# Patient Record
Sex: Female | Born: 1955 | Race: Black or African American | Smoking: Never smoker
Health system: Northeastern US, Academic
[De-identification: ages and names within clinical notes are randomized; demographics above are authoritative.]

## PROBLEM LIST (undated history)

## (undated) DIAGNOSIS — I10 Essential (primary) hypertension: Secondary | ICD-10-CM

## (undated) DIAGNOSIS — E119 Type 2 diabetes mellitus without complications: Secondary | ICD-10-CM

## (undated) DIAGNOSIS — F419 Anxiety disorder, unspecified: Secondary | ICD-10-CM

## (undated) DIAGNOSIS — M069 Rheumatoid arthritis, unspecified: Secondary | ICD-10-CM

## (undated) HISTORY — DX: Rheumatoid arthritis, unspecified: M06.9

## (undated) HISTORY — DX: Essential (primary) hypertension: I10

## (undated) HISTORY — PX: CHOLECYSTECTOMY, LAPAROSCOPIC: SHX56

## (undated) HISTORY — PX: KNEE SURGERY: SHX244

## (undated) HISTORY — PX: CHOLECYSTECTOMY: SHX55

## (undated) HISTORY — PX: JOINT REPLACEMENT: SHX530

## (undated) HISTORY — PX: KNEE REPLACEMENT: SHX530B

## (undated) HISTORY — DX: Type 2 diabetes mellitus without complications: E11.9

## (undated) HISTORY — PX: LUMBAR FUSION: SHX111

## (undated) HISTORY — DX: Anxiety disorder, unspecified: F41.9

## (undated) HISTORY — PX: BACK SURGERY: SHX140

## (undated) SURGERY — MAMMOPLASTY, REDUCTION
Anesthesia: General | Site: Breast | Laterality: Bilateral

---

## 2006-08-31 HISTORY — PX: BREAST BIOPSY: SHX20

## 2015-07-14 ENCOUNTER — Ambulatory Visit
Admission: AD | Admit: 2015-07-14 | Discharge: 2015-07-14 | Disposition: A | Discharge status: Home / Self Care | Payer: Self-pay | Attending: Emergency Medicine | Admitting: Emergency Medicine

## 2015-07-14 DIAGNOSIS — H9192 Unspecified hearing loss, left ear: Secondary | ICD-10-CM

## 2015-07-14 HISTORY — DX: Essential (primary) hypertension: I10

## 2015-07-14 HISTORY — DX: Type 2 diabetes mellitus without complications: E11.9

## 2015-07-14 NOTE — ED Triage Notes (Signed)
pt with c/o left ear fullness ringing plugged feeling since yesterday     Triage Note   Gavin PoundElizabeth M Kennedy-Gebo, RN

## 2015-07-14 NOTE — UC Provider Note (Signed)
History     Chief Complaint   Patient presents with    Ear Fullness     pt with c/o left ear fullness ringing plugged feeling since yesterday      HPI Comments: Had sudden onset absolute loss of hearing in L ear since late yesterday. No preceding illness, no allergies, no recent flight. Sometimes uses olive oil to remove wax, but it did not help. No family history of hearing loss.      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 No pertinent family history.      Social History    reports that she has never smoked. She does not have any smokeless tobacco history on file. She reports that she does not drink alcohol or use illicit drugs. Her sexual activity history is not on file.    Living Situation     Questions Responses    Patient lives with Family    Homeless No    Caregiver for other family member No    External Services None    Employment Retired    Domestic Violence Risk No          Review of Systems   Review of Systems   Constitutional: Negative for activity change, chills and fever.   HENT: Positive for hearing loss. Negative for congestion, dental problem, ear discharge, ear pain, facial swelling, rhinorrhea, sinus pressure, sore throat, tinnitus, trouble swallowing and voice change.    Eyes: Negative for visual disturbance.   Respiratory: Negative for cough and shortness of breath.    Cardiovascular: Negative for chest pain.   Gastrointestinal: Negative for nausea and vomiting.   Musculoskeletal: Negative for gait problem.   Skin: Negative for rash.   Neurological: Negative for dizziness, syncope, facial asymmetry, speech difficulty, weakness, numbness (or paresthesia) and headaches.   Hematological:  Negative for adenopathy.   Psychiatric/Behavioral: Negative for sleep disturbance.       Physical Exam       ED Triage Vitals   BP Heart Rate Heart Rate (via Pulse Ox) Resp Temp Temp src SpO2 O2 Device O2 Flow Rate   07/14/15 1530 07/14/15 1530 -- 07/14/15 1530 07/14/15 1530 -- 07/14/15 1530 -- --   156/82 65  18 36.6 C (97.9 F)  98 %        Weight           07/14/15 1530           95.3 kg (210 lb)               Physical Exam   Constitutional: She is oriented to person, place, and time. Vital signs are normal. She appears well-developed. No distress.   HENT:   Head: Normocephalic and atraumatic.   Nose: Nose normal.   Mouth/Throat: Oropharynx is clear and moist.   R canal and TM clear.  L canal is clear, L TM is clear, L middle ear space appears clear.  She does not hear finger rub or tuning fork on the L.  When tuning fork is placed at top of head (512C) she does not hear it on the left.   Eyes: Conjunctivae are normal. No scleral icterus.   Neck: Neck supple.   Cardiovascular: Normal rate, regular rhythm, normal heart sounds and intact distal pulses.    Pulmonary/Chest: Effort normal and breath sounds normal.   Musculoskeletal:   Gait normal   Neurological: She is alert and oriented to person, place, and time. No cranial nerve deficit (Face symmetric, speech clear.). Gait normal.   Skin: Skin is warm and dry.   Psychiatric: She has a normal mood and affect. Her behavior is normal.   Nursing note and vitals reviewed.       Medical Decision Making        Initial Evaluation:  ED First Provider Contact     Date/Time Event User Comments    07/14/15 1613 ED Provider First Contact Dorna Bloom Initial Face to Face Provider Contact          Patient seen by me as above    Assessment:  59 y.o.female [facility] with sudden profound left-sided hearing loss    Differential Diagnosis includes acoustic neuroma, meniere's, unrecognized trauma, other brain lesion, structural ear problem               Plan: Referred to ENT, and f/u w PCP. Instructed not to put anything into the ear. Warning signs reviewed.    Final Diagnosis  Final diagnoses:   [H91.92] Acute hearing loss of left ear (Primary)           Dorna Bloom, MD       Dorna Bloom, MD  07/15/15 1719

## 2015-07-14 NOTE — Discharge Instructions (Signed)
DO NOT PUT ANYTHING INTO THE EAR.  DO NOT CLEAN THE EAR WITH ANYTHING OTHER THAN A WASHCLOTH.

## 2015-07-16 ENCOUNTER — Ambulatory Visit: Payer: Self-pay | Admitting: Audiology

## 2015-07-16 ENCOUNTER — Ambulatory Visit: Payer: Self-pay | Admitting: Otolaryngology

## 2015-07-16 ENCOUNTER — Encounter: Payer: Self-pay | Admitting: Otolaryngology

## 2015-07-16 VITALS — BP 161/81 | HR 63 | Temp 97.9°F | Ht 64.0 in | Wt 209.0 lb

## 2015-07-16 DIAGNOSIS — H9122 Sudden idiopathic hearing loss, left ear: Secondary | ICD-10-CM

## 2015-07-16 MED ORDER — PREDNISONE 10 MG PO TABS *I*
ORAL_TABLET | ORAL | 0 refills | Status: DC
Start: 2015-07-16 — End: 2016-07-31

## 2015-07-16 NOTE — Progress Notes (Signed)
Subjective:      Robin Green is a 59 y.o. female who presents to the same day ENT consult service with concerns of sudden hearing loss.  On 07/13/15 at 6 pm she noticed left sided sudden deafness, and ear canal/conchal bowl paresthesias. There was a buzzing sound the left ear, not anymore. The ear feels plugged up. She was seen by urgent care who advised follow up in our office. Audiogram on 07/16/15 revealed profound to non responsive SNHL of all frequencies of the left ear, normal tympanograms and 0% speech discrimination scores. The right ear is without hearing loss, 100% speech discrimination scores, normal tympanogram. No prior ear issues. No preceding URI. No peri aural numbness. No balance problems. No prior Bell's palsy. All other ROS denied.  Her blood pressure has been running high lately despite verapamil/HTCZ.   She is diabetic and reports this is controlled on metformin.     History:    has a past medical history of Diabetes mellitus and Hypertension.   has a past surgical history that includes joint replacement and Cholecystectomy, laparoscopic.   reports that she has never smoked. She does not have any smokeless tobacco history on file. She reports that she does not drink alcohol or use illicit drugs.  family history is not on file.    Allergies:   Lisinopril     Medications:     Current Outpatient Prescriptions   Medication Sig    ALPRAZolam (XANAX) 0.25 MG tablet TK 1 T PO QD PRN. MDD 1 T.    hydrochlorothiazide (MICROZIDE) 12.5 MG capsule TK 1 C PO QD    verapamil (CALAN) 120 MG tablet Take 240 mg by mouth daily    metFORMIN (GLUCOPHAGE) 500 MG tablet Take 500 mg by mouth 2 times daily (with meals)    aspirin 81 MG tablet Take 81 mg by mouth daily    potassium chloride SA (KLOR-CON M10) 10 mEq  tablet Take 10 mEq by mouth 2 times daily    cholecalciferol (VITAMIN D) 1000 UNIT tablet Take 1,000 Units by mouth daily    Misc Natural Products (OSTEO BI-FLEX ADV DOUBLE ST) CAPS Take by mouth     Omega-3 Fatty Acids (FISH OIL) 1000 MG CAPS Take by mouth    amoxicillin (AMOXIL) 500 MG capsule     ALPRAZolam (XANAX) 0.5 MG tablet Take 0.5 mg by mouth nightly as needed for Anxiety     Objective:      BP 161/81  Pulse 63  Temp 36.6 C (97.9 F)  Ht 1.626 m ( )  Wt 94.8 kg (209 lb)  BMI 35.87 kg/m2    General:   Normocephalic, well nourished, with appropriate affect in NAD.  A &O x 3.    NEURO:  CN II-XII grossly intact.  Normal cerebellar function tests with heel to shin, finger to nose.  Normal ULE/LLE strength and symmetry.       Head and Face:  The head and face reveal normal facial symmetry without lesions or scars. The salivary glands are palpated and appear normal without tenderness or mass.    EYES:  Non icteric, normal conjunctiva.  EOMI.   Ears: RIGHT EAR:  Ear pinna is normal in appearance with no scars, lesions or masses. Mastoid bone is non tender. The ear canal is clear.  The tympanic membrane is intact without inflammation or perforation. TM is mobile to pneumatic otoscopy without evidence of middle ear effusion.      LEFT EAR:  Ear  pinna is normal in appearance with no scars, lesions or masses. Mastoid bone is non tender. The ear canal is clear. The tympanic membrane is intact without inflammation or perforation. TM is mobile to pneumatic otoscopy without evidence of middle ear effusion.    Nose:    External nose is normal. The nasal mucosa is not inflamed. The nasal septum is generally midline and there is no evidence of septal perforation or hematoma. The inferior turbinates are normal without masses or obstructions. No polyps are visualized. The paranasal sinuses are non tender.   Mouth / Throat:   ORAL CAVITY: The lips, teeth, tongue and buccal mucosa appear normal without lesions or inflammation.  The uvula and soft palate are normal.  The tonsils are non erythematous or swollen.  Oropharynx is without lesion, inflammation or swelling.   Neck:  There is no asymmetry or cervical  lymphadenopathy noted in either anterior or posterior neck chains or supraclavicular fossa bilaterally. There are no evident masses, trachea is midline and the thyroid gland reveals no enlargement, tenderness nor mass effect.        Assessment:       59 y.o. 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  Pathophysiology of the condition was discussed.  Treatment options were discussed including oral steroids and trans tympanic steroid injections.  As a hypertensive diabetic, oral steroids are not a good option.  In our office today, there was no provider who could perform a trans tympanic injection. We discussed using oral prednisone as a hypertensive diabetic and discussed the risks. I have started her on a 5 day oral 60 mg prednisone with taper. We discussed potential side effects and risks were discussed. I advised she stay in close contact with us and her PCP's office. She understands she may not be healthy enough to tolerate oral prednisone. Zinc supplements were advised. Follow up is scheduled in 2 weeks with an audiogram first to reassess hearing.  Trans tympanic injection of dexamethasone will be considered at the next visit with Dr. Neil Crouchrane.  An MRI will be ordered to rule out other, albeit rare, potential etiologies. It has been a pleasure participating in the care of your patient. [facility] you [facility].  If you have any questions, please do not hesitate to contact me.

## 2015-07-16 NOTE — Patient Instructions (Signed)
Prednisone (PRED ni sone)    Brand Names: [place] PredniSONE Intensol; Rayos   Brand Names: [place] Apo-Prednisone; Novo-Prednisone; Winpred   What is this drug used for?   · It is used for many health problems like allergy signs, asthma, adrenal gland problems, blood problems, skin rashes, or swelling problems. This is not a list of all health problems that this drug may be used for. Talk with the doctor.  What do I need to tell my doctor BEFORE I take this drug?   · If you have an allergy to prednisone or any other part of this drug.  · If you are allergic to any drugs like this one, any other drugs, foods, or other substances. Tell your doctor about the allergy and what signs you had, like rash; hives; itching; shortness of breath; wheezing; cough; swelling of face, lips, tongue, or throat; or any other signs.  · If you have a herpes infection of the eye.  · If you have any of these health problems: A fungal infection or malaria infection in the brain.  · If you have nerve problems in the eye.  · This is not a list of all drugs or health problems that interact with this drug.  · Tell your doctor and pharmacist about all of your drugs (prescription or OTC, natural products, vitamins) and health problems. You must check to make sure that it is safe for you to take this drug with all of your drugs and health problems. Do not start, stop, or change the dose of any drug without checking with your doctor.  What are some things I need to know or do while I take this drug?   · Tell dentists, surgeons, and other doctors that you use this drug.  · Have blood work checked as you have been told by the doctor. Talk with the doctor.  · Have a bone density test. Talk with your doctor.  · Have your eye pressure checked if you are on this drug for a long time. Talk with your doctor.  · If you have been taking this drug for many weeks, talk with your doctor before stopping. You may want to slowly stop this drug.  · You may have  more chance of getting an infection. Wash hands often. Stay away from people with infections, colds, or flu.  · Chickenpox and measles can be very bad or even deadly in some people taking steroid drugs like this drug. Avoid being near anyone with chickenpox or measles if you have not had these health problems before. If you have been exposed to chickenpox or measles, talk with your doctor.  · This drug may lower how much natural steroid is in your body. If you have a fever, an infection, surgery, or you are hurt, talk with your doctor. You may need extra doses of oral steroids. These extra steroids will help your body deal with these stresses. Carry a warning card saying that there may be times when you need extra steroids.  · Long-term use may raise the chance of cataracts or glaucoma. Talk with the doctor.  · This drug may cause weak bones (osteoporosis) with long-term use. Talk with your doctor to see if you have a higher chance of weak bones or if you have any questions.  · Talk with your doctor before getting any vaccines. Use with this drug may either raise the chance of an infection or make the vaccine not work as well.  · If   you have high blood sugar (diabetes), you will need to watch your blood sugar closely.  · Talk with your doctor before you drink alcohol.  · You may need to lower how much salt is in your diet and take extra potassium. Talk with your doctor.  · If you are 65 or older, use this drug with care. You could have more side effects.  · This drug may affect growth in children and teens in some cases. They may need regular growth checks. Talk with the doctor.  · Tell your doctor if you are pregnant or plan on getting pregnant. You will need to talk about the benefits and risks of using this drug while you are pregnant.  · Tell your doctor if you are breast-feeding. You will need to talk about any risks to your baby.  What are some side effects that I need to call my doctor about right away?    · WARNING/CAUTION: Even though it may be rare, some people may have very bad and sometimes deadly side effects when taking a drug. Tell your doctor or get medical help right away if you have any of the following signs or symptoms that may be related to a very bad side effect:  · Signs of an allergic reaction, like rash; hives; itching; red, swollen, blistered, or peeling skin with or without fever; wheezing; tightness in the chest or throat; trouble breathing or talking; unusual hoarseness; or swelling of the mouth, face, lips, tongue, or throat.  · Signs of infection like fever, chills, very bad sore throat, ear or sinus pain, cough, more sputum or change in color of sputum, pain with passing urine, mouth sores, or wound that will not heal.  · Signs of high blood sugar like confusion, feeling sleepy, more thirst, more hungry, passing urine more often, flushing, fast breathing, or breath that smells like fruit.  · Signs of low potassium levels like muscle pain or weakness, muscle cramps, or a heartbeat that does not feel normal.  · Signs of a pancreas problem (pancreatitis) like very bad stomach pain, very bad back pain, or very bad upset stomach or throwing up.  · Feeling very tired, weak, or touchy; trembling; having a fast heartbeat, confusion, sweating, or dizziness if you missed a dose or recently stopped this drug.  · Shortness of breath, a big weight gain, swelling in the arms or legs.  · Skin changes (pimples, stretch marks, slow healing, hair growth).  · Round face.  · A fatty pad or hump between the shoulders.  · Very bad headache.  · Fast or slow heartbeat.  · A heartbeat that does not feel normal.  · Chest pain or pressure.  · Swelling, warmth, numbness, change of color, or pain in a leg or arm.  · Period (menstrual) changes. These include lots of bleeding, spotting, or bleeding between cycles.  · Bone or joint pain.  · Feeling very tired or weak.  · Change in eyesight.  · Mood changes.  · Change in  the way you act.  · Low mood (depression).  · Seizures.  · A burning, numbness, or tingling feeling that is not normal.  · Very bad belly pain.  · Any bruising or bleeding.  · Black, tarry, or bloody stools.  · Throwing up blood or throw up that looks like coffee grounds.  What are some other side effects of this drug?   · All drugs may cause side effects. However, many people have no side   effects or only have minor side effects. Call your doctor or get medical help if any of these side effects or any other side effects bother you or do not go away:  · Upset stomach or throwing up.  · Not able to sleep.  · Restlessness.  · Sweating a lot.  · These are not all of the side effects that may occur. If you have questions about side effects, call your doctor. Call your doctor for medical advice about side effects.  · You may report side effects to your national health agency.  How is this drug best taken?   · Use this drug as ordered by your doctor. Read all information given to you. Follow all instructions closely.  · All products:  · Take in the morning if taking once a day.  · Take with food.  · To gain the most benefit, do not miss doses.  · Take as you have been told, even if you feel well.  · Long-acting products:  · Swallow whole. Do not chew, break, or crush.  · Liquid (solution):  · Measure liquid doses carefully. Use the measuring device that comes with this drug. If there is none, get an oral syringe, a dropper, a spoon, or a cup (only for older children) from your pharmacist.  · Liquid (concentrate):  · Measure liquid doses carefully. Use the measuring device that comes with this drug.  What do I do if I miss a dose?   · Take a missed dose as soon as you think about it.  · If it is close to the time for your next dose, skip the missed dose and go back to your normal time.  · Do not take 2 doses at the same time or extra doses.  How do I store and/or throw out this drug?   · All products:  · Store at room  temperature.  · Store in a dry place. Do not store in a bathroom.  · Keep all drugs out of the reach of children and pets.  · Check with your pharmacist about how to throw out unused drugs.  · Liquid (concentrate):  · Throw away any part not used after 3 months.  General drug facts   · If your symptoms or health problems do not get better or if they become worse, call your doctor.  · Do not share your drugs with others and do not take anyone else's drugs.  · Keep a list of all your drugs (prescription, natural products, vitamins, OTC) with you. Give this list to your doctor.  · Talk with the doctor before starting any new drug, including prescription or OTC, natural products, or vitamins.  · Some drugs may have another patient information leaflet. If you have any questions about this drug, please talk with your doctor, pharmacist, or other health care provider.  · If you think there has been an overdose, call your poison control center or get medical care right away. Be ready to tell or show what was taken, how much, and when it happened.  Consumer Information Use and Disclaimer   · This information should not be used to decide whether or not to take this medicine or any other medicine. Only the healthcare provider has the knowledge and training to decide which medicines are right for a specific patient. This information does not endorse any medicine as safe, effective, or approved for treating any patient or health condition. This is only a brief summary   of general information about this medicine. It does NOT include all information about the possible uses, directions, warnings, precautions, interactions, adverse effects, or risks that may apply to this medicine. This information is not specific medical advice and does not replace information you receive from the healthcare provider. You must talk with the healthcare provider for complete information about the risks and benefits of using this medicine.  Last  Reviewed Date   2012-06-21  Copyright     2015 [employer] and its affiliates and/or licensors. All rights reserved.        Prednisone (PRED ni sone)    Brand Names: [place] PredniSONE Intensol; Rayos   Brand Names: [place] Apo-Prednisone; Novo-Prednisone; Winpred   What is this drug used for?    It is used for many health problems like allergy signs, asthma, adrenal gland problems, blood problems, skin rashes, or swelling problems. This is not a list of all health problems that this drug may be used for. Talk with the doctor.  What do I need to tell my doctor BEFORE I take this drug?    If you have an allergy to prednisone or any other part of this drug.   If you are allergic to any drugs like this one, any other drugs, foods, or other substances. Tell your doctor about the allergy and what signs you had, like rash; hives; itching; shortness of breath; wheezing; cough; swelling of face, lips, tongue, or throat; or any other signs.   If you have a herpes infection of the eye.   If you have any of these health problems: A fungal infection or malaria infection in the brain.   If you have nerve problems in the eye.   This is not a list of all drugs or health problems that interact with this drug.   Tell your doctor and pharmacist about all of your drugs (prescription or OTC, natural products, vitamins) and health problems. You must check to make sure that it is safe for you to take this drug with all of your drugs and health problems. Do not start, stop, or change the dose of any drug without checking with your doctor.  What are some things I need to know or do while I take this drug?    Tell dentists, surgeons, and other doctors that you use this drug.   Have blood work checked as you have been told by the doctor. Talk with the doctor.   Have a bone density test. Talk with your doctor.   Have your eye pressure checked if you are on this drug for a long time. Talk with your  doctor.   If you have been taking this drug for many weeks, talk with your doctor before stopping. You may want to slowly stop this drug.   You may have more chance of getting an infection. Wash hands often. Stay away from people with infections, colds, or flu.   Chickenpox and measles can be very bad or even deadly in some people taking steroid drugs like this drug. Avoid being near anyone with chickenpox or measles if you have not had these health problems before. If you have been exposed to chickenpox or measles, talk with your doctor.   This drug may lower how much natural steroid is in your body. If you have a fever, an infection, surgery, or you are hurt, talk with your doctor. You may need extra doses of oral steroids. These extra steroids will help  your body deal with these stresses. Carry a warning card saying that there may be times when you need extra steroids.   Long-term use may raise the chance of cataracts or glaucoma. Talk with the doctor.   This drug may cause weak bones (osteoporosis) with long-term use. Talk with your doctor to see if you have a higher chance of weak bones or if you have any questions.   Talk with your doctor before getting any vaccines. Use with this drug may either raise the chance of an infection or make the vaccine not work as well.   If you have high blood sugar (diabetes), you will need to watch your blood sugar closely.   Talk with your doctor before you drink alcohol.   You may need to lower how much salt is in your diet and take extra potassium. Talk with your doctor.   If you are 58 or older, use this drug with care. You could have more side effects.   This drug may affect growth in children and teens in some cases. They may need regular growth checks. Talk with the doctor.   Tell your doctor if you are pregnant or plan on getting pregnant. You will need to talk about the benefits and risks of using this drug while you are pregnant.   Tell your doctor if  you are breast-feeding. You will need to talk about any risks to your baby.  What are some side effects that I need to call my doctor about right away?    WARNING/CAUTION: Even though it may be rare, some people may have very bad and sometimes deadly side effects when taking a drug. Tell your doctor or get medical help right away if you have any of the following signs or symptoms that may be related to a very bad side effect:   Signs of an allergic reaction, like rash; hives; itching; red, swollen, blistered, or peeling skin with or without fever; wheezing; tightness in the chest or throat; trouble breathing or talking; unusual hoarseness; or swelling of the mouth, face, lips, tongue, or throat.   Signs of infection like fever, chills, very bad sore throat, ear or sinus pain, cough, more sputum or change in color of sputum, pain with passing urine, mouth sores, or wound that will not heal.   Signs of high blood sugar like confusion, feeling sleepy, more thirst, more hungry, passing urine more often, flushing, fast breathing, or breath that smells like fruit.   Signs of low potassium levels like muscle pain or weakness, muscle cramps, or a heartbeat that does not feel normal.   Signs of a pancreas problem (pancreatitis) like very bad stomach pain, very bad back pain, or very bad upset stomach or throwing up.   Feeling very tired, weak, or touchy; trembling; having a fast heartbeat, confusion, sweating, or dizziness if you missed a dose or recently stopped this drug.   Shortness of breath, a big weight gain, swelling in the arms or legs.   Skin changes (pimples, stretch marks, slow healing, hair growth).   Round face.   A fatty pad or hump between the shoulders.   Very bad headache.   Fast or slow heartbeat.   A heartbeat that does not feel normal.   Chest pain or pressure.   Swelling, warmth, numbness, change of color, or pain in a leg or arm.   Period (menstrual) changes. These include lots of  bleeding, spotting, or bleeding between cycles.   Bone or  joint pain.   Feeling very tired or weak.   Change in eyesight.   Mood changes.   Change in the way you act.   Low mood (depression).   Seizures.   A burning, numbness, or tingling feeling that is not normal.   Very bad belly pain.   Any bruising or bleeding.   Black, tarry, or bloody stools.   Throwing up blood or throw up that looks like coffee grounds.  What are some other side effects of this drug?    All drugs may cause side effects. However, many people have no side effects or only have minor side effects. Call your doctor or get medical help if any of these side effects or any other side effects bother you or do not go away:   Upset stomach or throwing up.   Not able to sleep.   Restlessness.   Sweating a lot.   These are not all of the side effects that may occur. If you have questions about side effects, call your doctor. Call your doctor for medical advice about side effects.   You may report side effects to your national health agency.  How is this drug best taken?    Use this drug as ordered by your doctor. Read all information given to you. Follow all instructions closely.   All products:   Take in the morning if taking once a day.   Take with food.   To gain the most benefit, do not miss doses.   Take as you have been told, even if you feel well.   Long-acting products:   Swallow whole. Do not chew, break, or crush.   Liquid (solution):   Measure liquid doses carefully. Use the measuring device that comes with this drug. If there is none, get an oral syringe, a dropper, a spoon, or a cup (only for older children) from your pharmacist.   Liquid (concentrate):   Measure liquid doses carefully. Use the measuring device that comes with this drug.  What do I do if I miss a dose?    Take a missed dose as soon as you think about it.   If it is close to the time for your next dose, skip the missed dose and go back to  your normal time.   Do not take 2 doses at the same time or extra doses.  How do I store and/or throw out this drug?    All products:   Store at room temperature.   Store in a dry place. Do not store in a bathroom.   Keep all drugs out of the reach of children and pets.   Check with your pharmacist about how to throw out unused drugs.   Liquid (concentrate):   Throw away any part not used after 3 months.  General drug facts    If your symptoms or health problems do not get better or if they become worse, call your doctor.   Do not share your drugs with others and do not take anyone else's drugs.   Keep a list of all your drugs (prescription, natural products, vitamins, OTC) with you. Give this list to your doctor.   Talk with the doctor before starting any new drug, including prescription or OTC, natural products, or vitamins.   Some drugs may have another patient information leaflet. If you have any questions about this drug, please talk with your doctor, pharmacist, or other health care provider.   If you think  there has been an overdose, call your poison control center or get medical care right away. Be ready to tell or show what was taken, how much, and when it happened.  Consumer Information Use and Disclaimer    This information should not be used to decide whether or not to take this medicine or any other medicine. Only the healthcare provider has the knowledge and training to decide which medicines are right for a specific patient. This information does not endorse any medicine as safe, effective, or approved for treating any patient or health condition. This is only a brief summary of general information about this medicine. It does NOT include all information about the possible uses, directions, warnings, precautions, interactions, adverse effects, or risks that may apply to this medicine. This information is not specific medical advice and does not replace information you receive from the  healthcare provider. You must talk with the healthcare provider for complete information about the risks and benefits of using this medicine.  Last Reviewed Date   2012-06-21  Copyright     2015 [facility] [employer] and its affiliates and/or licensors. All rights reserved.

## 2015-07-16 NOTE — Progress Notes (Signed)
AUDIOLOGIC EVALUATION     [facility]  Audiology   [address]  [place],  [place] [id number]  Phone: [phone], Fax: [phone]     Outpatient Visit  Patient: Robin Green   MR Number: [id number]   Date of Birth: 23-Sep-1955   Date of Visit: 07/16/2015      PURE-TONE TEST RESULTS  Type of Testing: conventional      Test Reliability: good  Transducer: headphone  Booth: 1  ANSI S3.21.2004 (R2009)     Air Conduction Testing (dB HL and kHz)     LEFT EAR RIGHT EAR     0.125 0.25 0.50  0.75 1.0 1.5 2.0 3.0 4.0 6.0 8.0  0.125 0.25 0.50 0.75 1.0 1.5 2.0 3.0 4.0 6.0 8.0     80 85 100 105   NR NR NR NR NR      15 20   15   15   15   20                                                                        Effective Masking Level       65 65 70 80   80 80 80 80 80                            Bone Conduction Testing (dB HL and kHz)  Bone conduction was masked when appropriate      0.25 0.50  0.75 1.0 1.5 2.0 3.0 4.0     0.25 0.50 0.75 1.0 1.5 2.0 3.0 4.0      VT (25) VT (60)   VT (45)   VT (80)   NR     -10 -5   5   20   20                                                                          Effective Masking Level                                               SPEECH AUDIOMETRY     SAT SRT Score dB HL EML  Test  SAT SRT Score dB HL EML  Test       0 % 100 70 W-22 CD      100 % 45   W-22 CD   EML   EML            EML   EML               Please [id number] for additional information     Notes  Threshold in dB HL  Frequency in kiloHertz (kHz) Legend   dB=decibels  HL=Hearing Level  NR=No Response  VT=Vibro-Tactile EML=Effective Masking Level  SAT=Speech Awareness Threshold  SRT=Speech Reception  Threshold  MLV=Monitored Live Voice  CD=Compact Disk       HISTORY: Referred by Theodosia Blender, PA for an audiological evaluation.  The patient reports that she experienced a sudden loss of the hearing in her left ear on the evening of 07/13/15.  She states that she also noticed a ringing and static  in the left ear at the time she initially noted the decrease in hearing.  The patient reports no change in hearing since this time, but notes that the tinnitus is now intermittent.  She also states that since the time of the incident, she felt excessive pressure in the left ear.  The patient reports that the pressure has remained constant since Saturday.   She denies any pain in the ears or dizziness.      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  Speech recognition ability in quiet was very poor (0%), left ear, and excellent, right ear, when speech was presented at a very loud level, left ear, and at a soft conversational level, right ear.    ACOUSTIC IMMITTANCE:    TYMPANOMETRY:  Right Ear: Tested Left Ear: Tested   Frequency: 226 Hz  Frequency: 226 Hz   Canal Volume (ml): 1.2 Canal Volume (ml): 1.3   Static Compliance Peak (ml): 1.0 Static Compliance Peak (ml): 0.7   Peak Pressure (daPa): 20 Peak Pressure (daPa): 15   Gradient (daPa): 65 Gradient (daPa): 80      These results are consistent with normal Eustachian tube function, bilaterally.    RECOMMENDATIONS: Audiological re-evaluation as per Theodosia Blender, PA.    Hanley Hays, AuD, CCC-A  Audiologist  [facility]iology

## 2015-07-17 ENCOUNTER — Encounter: Payer: Self-pay | Admitting: Otolaryngology

## 2015-07-17 ENCOUNTER — Telehealth: Payer: Self-pay | Admitting: Otolaryngology

## 2015-07-17 NOTE — Communication Body (Signed)
Booked MRI Head w/o contrast at [facility] ([address])   11.22.16  2:45PM (No Prep)  F/U Dr. Marylou Mccoy  12.5.16  1:45PM  Audio  1PM  Auth#  [id number]  Exp   12.31.16  Confirmed VM   11.16.16  9:41AM   [facility]

## 2015-07-17 NOTE — Telephone Encounter (Signed)
-----   Message from Eulogio Bearanielle Roelle sent at 07/17/2015 12:11 PM EST -----  Regarding: FW: Should be seen in two weeks with Neil Crouchrane.   Dr. Neil Crouchrane is here on 07/29/15.  I can double book that day if okayed with an audio add-on.  ----- Message -----     From: Theodosia BlenderSiala, Tarek, PA     Sent: 07/16/2015   7:12 PM       To: Ent Staff  Subject: Should be seen in two weeks with Neil Crouchrane.          Id like her to see Dr. Neil Crouchrane in two weeks with an audiogram.    She's scheduled to be seen in 3 weeks. Is crane in the office in two weeks? I may email him to have him do a double book.     Please, let me know,  Maryln Gottronarek

## 2015-07-19 NOTE — Telephone Encounter (Signed)
Patient states that she is having an MRI on Tuesday.  Do we want to see her before this?      Thank You

## 2015-07-19 NOTE — Telephone Encounter (Signed)
Spoke w/patient sch appt for 11/28 3pm Audio 345pm Robin Green

## 2015-07-19 NOTE — Telephone Encounter (Signed)
Deirdre Pippinsrane, Benjamin, MD  You; Theodosia BlenderSiala, Tarek, PA 34 minutes ago (8:08 AM)               I was out of town this week until today due to a meeting. I would be happy to overbook her for Monday, but unfortunately she won't be a candidate for the study at this point because it has been too long. (Routing comment)

## 2015-07-19 NOTE — Telephone Encounter (Signed)
Does not need to be seen before MRI.  Robin Green wanted patient seen within 2 weeks, Dr. Neil [facility]ane [facility]  Please offer patient either 7:30 AM, 8:00 AM or 8:45 AM on 07/29/15.  Thank you.

## 2015-07-29 ENCOUNTER — Ambulatory Visit: Payer: Self-pay | Admitting: Otolaryngology

## 2015-07-29 ENCOUNTER — Encounter: Payer: Self-pay | Admitting: Otolaryngology

## 2015-07-29 ENCOUNTER — Ambulatory Visit: Payer: Self-pay | Admitting: Audiology

## 2015-07-29 VITALS — BP 155/85 | HR 87 | Ht 65.0 in | Wt 207.0 lb

## 2015-07-29 DIAGNOSIS — H905 Unspecified sensorineural hearing loss: Secondary | ICD-10-CM

## 2015-07-29 DIAGNOSIS — H9122 Sudden idiopathic hearing loss, left ear: Secondary | ICD-10-CM

## 2015-07-29 MED ORDER — DEXAMETHASONE SOD PHOSPHATE PF 10 MG/ML IJ SOLN *I*
10.0000 mg | Freq: Once | INTRAMUSCULAR | Status: AC
Start: 2015-07-29 — End: 2015-07-30

## 2015-07-29 NOTE — Progress Notes (Signed)
AUDIOLOGIC EVALUATION     [facility]  Audiology   [address], [address]  [place],  [place] [id number]  Phone: [phone], Fax: [phone]     Outpatient Visit  Patient: Robin Green Walko   MR Number: [id number]   Date of Birth: 1955/11/16   Date of Visit: 07/29/2015      PURE-TONE TEST RESULTS  Type of Testing: conventional      Test Reliability: good  Transducer: headphone  Booth: 1  ANSI S3.21.2004 (R2009)     Air Conduction Testing (dB HL and kHz)     LEFT EAR RIGHT EAR     0.125 0.25 0.50  0.75 1.0 1.5 2.0 3.0 4.0 6.0 8.0  0.125 0.25 0.50 0.75 1.0 1.5 2.0 3.0 4.0 6.0 8.0     100 105   115   115   NR   NR      15 10   10   10   10   5                                                      Effective Masking Level       70 85   80   85   65   55                            Bone Conduction Testing (dB HL and kHz)  Bone conduction was not tested-known sensorineural hearing loss       SPEECH AUDIOMETRY  DNT     [id number] for additional information     Notes  Threshold in dB HL  Frequency in kiloHertz (kHz) Legend   dB=decibels  HL=Hearing Level  NR=No Response  VT=Vibro-Tactile EML=Effective Masking Level  SAT=Speech Awareness Threshold  SRT=Speech Reception  Threshold  MLV=Monitored Live Voice  CD=Compact Disk       HISTORY: Robin Green Hugill was seen for an audiologic re-evaluation at the request of Dr. Neil Crouchrane.   She experienced a sudden loss of hearing in her left ear on 07/13/15.  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  She was started on a course of prednisone but denies a significant change in hearing.      FINDINGS: Pure-tone test results indicate profound known to be SNHL, left ear, and normal hearing sensitivity in the right ear.  Left low-frequency thresholds are 20 dB poorer when compared with the most recent testing.  Speech was not tested today.      Today's test results were reviewed with the patient.  A CROS system was discussed in brief.    RECOMMENDATIONS: Patient to follow-up with Dr. Neil Crouchrane.    Valentina Shaggyhristina M. Meggen Spaziani, Au.D.  CCC-A  Audiologist  [facility]

## 2015-07-29 NOTE — Progress Notes (Signed)
Otolaryngology - Head and Neck Surgery Clinic Note    HPI: Robin Green is a 59 y.o. female seen in follow up for sudden left hearing loss. She was started on oral prednisone last week and has not noted any improvement in hear hearing. She had an MRI done. She has no other complaints.    Review of Systems:  As per HPI    Physical Examination:  VS:   Visit Vitals    BP 155/85 (BP Location: Left arm, Patient Position: Sitting, Cuff Size: large adult)    Pulse 87    Ht 1.651 m (5\' 5" )    Wt 93.9 kg (207 lb)    BMI 34.45 kg/m2     Gen: Awake, alert, breathing easily, NAD  Ears: EACs clear, TMs intact without effusion/erythema  Nose: nares clear anteriorly  OC/OP: no lesions or masses, no erythema/exudate  Neck: Soft, non-tender, no LA  Face: No asymmetry  Neuro: CN II-XII grossly intact    Audiogram today demonstrates normal right sided hearing. On left, increased hearing loss of 20dB at frequencies <= 500Hz .    MRI 07/23/15 reviewed and negative for retrocochlear pathology.    Preoperative diagnosis: Sudden left hearing loss  Post-operative diagnosis: Sudden left hearing loss  Procedure:  Transtympanic dexamethasone injection ([id number])  Surgeons:  Mal AmabileBenjamin T. Iyanna Drummer  Anes: Local with phenol  Complications: none  EBL: none.  Procedure in detail:  I reviewed the risks and potential benefits of the procedure with the patient.  We confirmed the correct side for the procedure.  The external auditory canal was examined under the microscope and the tympanic membrane was found to be intact.  A small area of the inferior tympanic membrane was numbed using topical phenol.  Afterwards a long 27-gauge needle was used to make a myringotomy.  Approximately 0.4 cc of dexamethasone 10mg /ml preservative free was injected into the middle ear.  The patient remained reclined for 30 minutes after which the excess dexamethasone was suctioned out.      Assessment:   Robin Green is a 59 y.o. female with sudden left hearing  loss now responsive to oral steroids, now s/p transtympanic dexamethasone injection.    Plan:  Taper off oral steroids  Follow up in 2 weeks.    Lanae BoastNadeem Kolia, MD on 07/29/2015 at 4:52 PM  Resident Physician  Otolaryngology - Head & Neck Surgery    I have seen and examined this patient and agree with the above.  I also supervised the above procedure which I was present for.

## 2015-08-05 ENCOUNTER — Ambulatory Visit: Payer: Self-pay

## 2015-08-05 ENCOUNTER — Ambulatory Visit: Payer: Self-pay | Admitting: Otolaryngology

## 2015-08-15 ENCOUNTER — Ambulatory Visit: Payer: Self-pay | Admitting: Otolaryngology

## 2015-08-15 ENCOUNTER — Encounter: Payer: Self-pay | Admitting: Otolaryngology

## 2015-08-15 DIAGNOSIS — H9122 Sudden idiopathic hearing loss, left ear: Secondary | ICD-10-CM

## 2015-08-15 MED ORDER — DEXAMETHASONE SOD PHOSPHATE PF 10 MG/ML IJ SOLN *I*
10.0000 mg | Freq: Once | INTRAMUSCULAR | Status: AC
Start: 2015-08-15 — End: 2015-08-16

## 2015-08-15 NOTE — Progress Notes (Signed)
Robin Green is a 59 yo woman with sudden hearing loss on the left side.  She previously tried oral steroids as well as had a negative MRI.  At the last visit we did a transtympanic dexamethasone injection on the left side.  She does feel that she's had in some improvement in hearing.  She comes back to this visit with her sister.    We discussed the possibility of doing another transtympanic dexamethasone injection which she wished to do.    Preoperative diagnosis:  Left sudden hearing loss  Post-operative diagnosis: same  Procedure:  Left transtympanic dexamethasone injection ([id number])  Surgeons:  Mal AmabileBenjamin T. Blaise Grieshaber  Anes: Local with phenol  Complications: none  EBL: none.  Procedure in detail:  I reviewed the risks and potential benefits of the procedure with the patient.  We confirmed the correct side for the procedure.  The external auditory canal was examined under the microscope and the tympanic membrane was found to be intact.  A small area of the inferior tympanic membrane was numbed using topical phenol.  Afterwards a long 27-gauge needle was used to make a myringotomy.  Approximately 0.4 cc of dexamethasone 10mg /ml preservative free was injected into the middle ear.  The patient remained reclined for 30 minutes after which the excess dexamethasone was suctioned out.    I would like to see her back in 2 weeks.

## 2015-09-05 ENCOUNTER — Ambulatory Visit: Payer: Self-pay | Admitting: Otolaryngology

## 2015-09-23 ENCOUNTER — Encounter: Payer: Self-pay | Admitting: Otolaryngology

## 2015-09-23 ENCOUNTER — Ambulatory Visit: Payer: Self-pay | Admitting: Otolaryngology

## 2015-09-23 VITALS — Ht 64.5 in | Wt 203.6 lb

## 2015-09-23 DIAGNOSIS — H9122 Sudden idiopathic hearing loss, left ear: Secondary | ICD-10-CM

## 2015-09-23 MED ORDER — DEXAMETHASONE SOD PHOSPHATE PF 10 MG/ML IJ SOLN *I*
10.0000 mg | Freq: Once | INTRAMUSCULAR | Status: AC
Start: 2015-09-23 — End: 2015-09-24

## 2015-09-23 NOTE — Progress Notes (Signed)
Robin Green is a 60 yo woman with sudden hearing loss on the left side.  She previously tried oral steroids as well as had a negative MRI.  At the last visit we did a second transtympanic dexamethasone injection on the left side.  She does feel that she's had some improvement in hearing.  She is amenable to one more injection given her improvement so far. She notes a sensation of something in her left ear, denies pain, otorrhea, tinnitus, or vertigo.    Vitals:    09/23/15 1438   Weight: 92.4 kg (203 lb 9.6 oz)   Height: 1.638 m (5' 4.5")     Gen: NAD  OC/OP: no erythema or exudates, no drainage in posterior oropharynx  Ears: right EAC clear, TM intact and mobile on pneumatic otoscopy. Left EAC clear, TM intact and mobile on pneumatic otoscopy. AC > BC bilaterally, Weber midline.    Preoperative diagnosis:  Left sudden hearing loss  Post-operative diagnosis: same  Procedure:  Left transtympanic dexamethasone injection (16109)  Surgeons:  Mal Amabile  Anes: Local with phenol  Complications: none  EBL: none.  Procedure in detail:  I reviewed the risks and potential benefits of the procedure with the patient.  We confirmed the correct side for the procedure.  The external auditory canal was examined under the microscope and the tympanic membrane was found to be intact.  A small area of the inferior tympanic membrane was numbed using topical phenol.  Afterwards a long 27-gauge needle was used to make a myringotomy.  Approximately 0.4 cc of dexamethasone /ml preservative free was injected into the middle ear.  The patient remained reclined for 30 minutes after which the excess dexamethasone was suctioned out.    A/P: 60 year old female with left sudden hearing loss. Some improvement with transtympanic dexamethasone injections. Third injection performed today, which was tolerated well.    Follow up in 2 weeks with an audiogram prior to that visit.    I have seen and examined this patient and performed the above  procedure.

## 2015-10-07 ENCOUNTER — Ambulatory Visit: Payer: Self-pay | Admitting: Otolaryngology

## 2015-10-07 ENCOUNTER — Encounter: Payer: Self-pay | Admitting: Otolaryngology

## 2015-10-07 ENCOUNTER — Ambulatory Visit: Payer: Self-pay | Admitting: Audiology

## 2015-10-07 VITALS — Ht 64.5 in | Wt 203.0 lb

## 2015-10-07 DIAGNOSIS — H9122 Sudden idiopathic hearing loss, left ear: Secondary | ICD-10-CM

## 2015-10-07 NOTE — Progress Notes (Signed)
Patient seen on 10/07/15 at 3:39 PM     HPI  CC: left hearing loss  Treatment to date: 3 dexamethasone injections    Context: Robin Green is a 60 y.o. female who presents for follow up for her history of sudden left-sided hearing loss as of 07/29/15. She has received 3 dexamethasone injections for her hearing loss, and has noted some improvement in hearing from these. Her last injection was on 09/23/15. Her audiogram shows some improvement in hearing and speech recognition since the last audiogram from 07/29/15. She has no other complaints today.    HISTORY  I have personally reviewed and updated in the electronic record the patient's past medical, surgical, social, and family history.    REVIEW OF SYSTEMS  Review of Systems   Constitutional: Negative for chills and fever.   HENT: Positive for hearing loss. Negative for ear discharge, ear pain and tinnitus.    Neurological: Negative for dizziness and headaches.         PHYSICAL EXAM  Vitals:    10/07/15 1528   Weight: 92.1 kg (203 lb)   Height: 1.638 m (5' 4.5")       The patient is well developed, well nourished, and in no acute distress.  They are able to communicate without assistance or hoaresness.  The patient had a calm affect.    Extraocular movements were intact, there was no nystagmus at rest.    Right ear:  The pinna was normal.  The external auditory canal was dry and non-erythematous.  The tympanic membrane was intact and mobile with pneumatic otoscopy.  No fluid was visible behind the tympanic membrane.      Left ear:  The pinna was normal.  The external auditory canal was dry and non-erythematous.  The tympanic membrane was intact and mobile with pneumatic otoscopy.  No fluid was visible behind the tympanic membrane.      STUDIES  Studies personally reviewed by myself.  Audiogram: 28% speech recognition. See chart for details.    ASSESSMENT AND PLAN  Diagnosis  1. Sudden left hearing loss          Robin Green is a 60 y.o. female who presented for  continued evaluation of sudden left-sided hearing loss.   The patient is not a good candidate for hearing aids.   I discussed the possibility of a CROS hearing aid or a [place] with the patient. I recommend that she live with this hearing for at least 6 months before considering these options.   She will follow up as needed.      AUTHORS  Attestations:  Ulyess Mort, am scribing for and in the presence of Dr. Deirdre Pippins on 10/07/15 at 3:38 PM.      I, Deirdre Pippins, MD, personally performed the services described in this documentation, as scribed by the scribe listed above in my presence, and it is accurate and complete.    Deirdre Pippins, MD    10/07/15 4:03 PM

## 2015-10-08 NOTE — Progress Notes (Signed)
AUDIOLOGIC EVALUATION     [facility]  Audiology   [address] [place], [address]  [place],  [place] [address]  Phone: [phone], Fax: [phone]     Outpatient Visit  Patient: Robin Green   MR Number: [id number]   Date of Birth: July 03, 1956   Date of Visit: 10/07/2015      PURE-TONE TEST RESULTS  Type of Testing: conventional      Test Reliability: good  Transducer: headphone  Booth: 1  ANSI S3.21.2004 (R2009)     Air Conduction Testing (dB HL and kHz)     LEFT EAR RIGHT EAR     0.125 0.25 0.50  0.75 1.0 1.5 2.0 3.0 4.0 6.0 8.0  0.125 0.25 0.50 0.75 1.0 1.5 2.0 3.0 4.0 6.0 8.0          Effective Masking Level                                  Bone Conduction Testing (dB HL and kHz)  Bone conduction was not tested-known sensorineural hearing loss      0.25 0.50  0.75 1.0 1.5 2.0 3.0 4.0     0.25 0.50 0.75 1.0 1.5 2.0 3.0 4.0                                                                                                               Effective Masking Level                                               SPEECH AUDIOMETRY     SAT SRT Score dB HL EML  Test  SAT SRT Score dB HL EML  Test       28 % 85 55 W-22 CD      100 % 45   W-22 CD   EML   EML   8 % 95 65 W-22 CD  EML   EML               Please refer to scanned [facility] [id number] MR for additional information     Notes  Threshold in dB HL  Frequency in kiloHertz (kHz) Legend   dB=decibels  HL=Hearing Level  NR=No Response  VT=Vibro-Tactile EML=Effective  Masking Level  SAT=Speech Awareness Threshold  SRT=Speech Reception  Threshold  MLV=Monitored Live Voice  CD=Compact Disk     HISTORY: Phylisha Dix was referred by Dr. Deirdre Pippins for an audiological evaluation to determine whether patient is a candidate for medical, surgical or rehabilitative services.  Patient has a history of sudden  hearing loss in the left ear on 07/13/2015.   The previous audiogram dated 07/29/2015 shows profound known-to-be sensorineural hearing loss, left ear, and normal hearing sensitivity, right ear.  Patient reported some improvement in left ear hearing since January 2016 after undergoing treatment.  She notes a plugged sensation in the left ear.  Patient denies tinnitus.     FINDINGS: Pure-tone test results indicate a mild sloping to profound rising to severe known-to-be sensorineural hearing loss, left ear, and normal hearing sensitivity, right ear.  Bone conduction testing was not performed.  Thresholds are significantly improved in the left ear when compared to the previous audiogram dated 07/29/2015.  Speech recognition ability in quiet was very poor at equipments limits, left ear, and excellent at a soft conversational level, right ear.      Today's test results were reviewed with the patient. The patient is a poor candidate for traditional amplification in the left ear but may benefit from a CROS hearing aid system, pending medical clearance. It was discussed with patient that she does not meet eligibility requirements for hearing aid coverage through [facility].  Hearing aid technology and the benefits and limitations of amplification were briefly discussed.     RECOMMENDATIONS: Audiologic re-evaluation as per Dr. Neil Crouch.      Jenene Slicker, AuD, Landscape architect  [facility] Aud[facility]

## 2015-12-25 DIAGNOSIS — Z96651 Presence of right artificial knee joint: Secondary | ICD-10-CM | POA: Insufficient documentation

## 2016-05-11 LAB — HM MAMMOGRAPHY

## 2016-06-10 LAB — LIPID PANEL
Cholesterol: 200 mg/dL — ABNORMAL HIGH
HDL: 85 mg/dL — ABNORMAL HIGH
LDL Calculated: 102 mg/dL
Non HDL Cholesterol: 115 mg/dL
Triglycerides: 67 mg/dL

## 2016-06-10 LAB — HEPATITIS C ANTIBODY: Hepatitis C Ab: NEGATIVE

## 2016-06-10 LAB — MICROALBUMIN, URINE, RANDOM
Creatinine,UR: 37.6 mg/dL
Microalbumin,UR: 3 mg/dL

## 2016-06-10 LAB — HEMOGLOBIN A1C: Hemoglobin A1C: 6 %

## 2016-06-16 LAB — HM DIABETES EYE EXAM

## 2016-06-24 NOTE — Progress Notes (Signed)
Date:   10/21/14[facility]        Name: Robin Green                   DOB: 25-Aug-1956 Sex: F  Age: 60 yrs  Acct#:  [id number]            Subjective         CC: [Continued diabetic footcare. ]    HPI: [ ]       Current Meds Prior to Visit: Ciclopirox Olamine 0.77 %, Meloxicam 7.5 mg, Metformin HCL 500 mg, Verapamil HCL 40 mg, Hydrochlorat 12.5, Hydrocodone-Apap 5-500mg , Potassimin 99 mg, Nystatin/Triamcinolone 100000-0.1 Unit/GM-%, Tramadol Hydrochloride/Acetaminophen 37.5-325 mg, Vitamin D3 High Potency 1000 Unit  Allergies: NKDA    PMH:  Problem List: Onychomycosis, Type 2 diabetes mellitus  Health Maintenance:  Flu Shot - (2013)  Pneumonia Vaccine - (06/20/2013) NONE  HgbA1C - (12/2012) 6.1  Flu Shot - (05/2013)  Reviewed and updated.    SH:  Personal Habits:  Smoking: Patient has never smoked - (06/20/2013).  Reviewed and updated.      Objective                    Exam:  CV: Dorsalis pedis pulse 1+ bilaterally. Posterior tibial pulse absent bilaterally. Extremities: 2+ edema in the lower limbs bilaterally. No varicosities noted. Capillary refill in the toes bilaterally is < 2 seconds. Gradual temperature decrease bilaterally.  Skin: No scars, rashes, lesions or ecchymosis on the lower extremities. Normal hair.  Neuro: Sensation intact to light touch. Sensation intact to pinprick. Sensation intact to monofilament. Sensation intact to vibration. Proprioception is intact bilaterally.  Reflexes: DTR's are normal and symmetric bilaterally. Babinski's reflex is negative and symmetrical bilaterally.  Patient has pes planus feet. There is hyperpronation on weightbearing with eversion of the calcaneus, and abduction of the forefoot. Currently wearing poor fitting sandles/clogs. There are thick dystrophic ingrown mycotic nails with subungual debris, discoloration, and hypertrophy. The nails are painful on palpation and on ambulation, #1 bilateral               Dx Studies:     Mycosis  Painful mycotic and  ingrown nails were debrided in length and thickness to reduce pain and dysfunction. Should these nails continue to cause pain/dysfunction, the patient will be seen on a periodic basis. Discussed topical antifungals and foot hygeine for active management.            Assessment #1: Hx 250.00 Diabetes Mellitus W/O Compl Type II or Unspec Controlled   Care Plan:           Comments       :  Diabetic foot care performed the patient today. The patient's nails and hyperkeratotic lesions were debrided. There are no signs of infection and or cellulitis. Webspace's are clear of interdigital tinea pedis. There no ulcerative areas or breaks in the skin. The patient was counseled on proper glycemic control. The patient will be seen in 2 months for follow up care.       Follow Up      :  2 months    Assessment #2: Hx 110.1 Dermatophytosis Nail   Care Plan:           Comments       :  The patients mycotic nails were debrided as described above.  Seen By:        DR Caprice Red

## 2016-06-25 ENCOUNTER — Ambulatory Visit: Payer: Self-pay | Admitting: Podiatry

## 2016-06-25 ENCOUNTER — Encounter: Payer: Self-pay | Admitting: Podiatry

## 2016-06-25 VITALS — Ht 64.5 in | Wt 201.0 lb

## 2016-06-25 DIAGNOSIS — E119 Type 2 diabetes mellitus without complications: Secondary | ICD-10-CM

## 2016-06-25 NOTE — Progress Notes (Signed)
Subjective:     Chief Complaint   Patient presents with    New Patient Visit     diabetic foot exam    Diabetic foot care needs to know she is doing well with her feet  A1C10/07/2016  [facility]   Component Name Value Ref Range   HEMOGLOBIN A1C 6.0   Comment:    Samples containing high amounts of HbF (>10%) may yield a  lower than expected HbA1C result. For blood samples  containing HbF>10%, please order test #2007. 4.0 - 6.0 %   EAVERAGE GLUCOSE 126 68 - 126 mg/dL     Last saw Hetty ElyEllis, Josephine, MD last week      Patient ID: Robin Green is a 60 y.o. female.    Allergy History as of 06/25/16     LISINOPRIL       Noted Status Severity Type Reaction    07/14/15 1533 Gavin PoundKennedy-Gebo, Elizabeth M, RN 07/14/15 Active Low Allergy Other (See Comments)    Comments:  Dizziness and cough                  Patient's medications, allergies, past medical, surgical, social and family histories were reviewed and updated as appropriate, there are no changes with the exception of none          Objective:   Physical Exam    CONSTITUTION: The patient appears well and is in no apparent distress, the patient is alert and oriented.  Pedal vascular exam:  Dorsalis pedis pulse 1+ bilaterally. Posterior tibial pulse 1+ bilaterally. Extremities: No edema of the lower limbs bilaterally. No varicosities noted. Capillary refill in the toes bilaterally is < 2 seconds. Gradual temperature decrease bilaterally.    Skin: No scars, rashes, lesions or ecchymosis on the lower extremities. Normal hair.    Neuro: Sensation intact to light touch. Sensation intact to pinprick. Sensation intact to monofilament. Sensation intact to vibration. Proprioception is intact bilaterally.    Reflexes: DTR's are normal and symmetric bilaterally. Babinski's reflex is negative and symmetrical bilaterally.    MUSCULOSKELETAL :Patient has pes planus feet. There is hyper pronation on weight bearing with eversion of the calcaneus, and abduction of the  forefoot.        Assessment:         1. Diabetes         Plan:      Diabetic foot care performed the patient today. The patient's nails and hyperkeratotic lesions were debrided. There are no signs of infection and or cellulitis. Web-spaces are clear of interdigital tinea pedis. There no ulcerative areas or breaks in the skin. The patient was counseled on proper glycemic control. The patient will be seen in 2 months for follow-up care.

## 2016-06-26 ENCOUNTER — Encounter: Payer: Self-pay | Admitting: Gastroenterology

## 2016-06-26 LAB — HM COLONOSCOPY

## 2016-07-07 ENCOUNTER — Ambulatory Visit: Payer: Self-pay | Admitting: Otolaryngology

## 2016-07-31 ENCOUNTER — Ambulatory Visit: Payer: Self-pay | Admitting: Audiology

## 2016-07-31 ENCOUNTER — Ambulatory Visit: Payer: Self-pay | Admitting: Otolaryngology

## 2016-07-31 ENCOUNTER — Encounter: Payer: Self-pay | Admitting: Otolaryngology

## 2016-07-31 VITALS — BP 176/83 | HR 65 | Ht 64.0 in | Wt 200.0 lb

## 2016-07-31 DIAGNOSIS — H9122 Sudden idiopathic hearing loss, left ear: Secondary | ICD-10-CM

## 2016-07-31 DIAGNOSIS — H905 Unspecified sensorineural hearing loss: Secondary | ICD-10-CM

## 2016-07-31 NOTE — Progress Notes (Signed)
DEPARTMENT OF OTOLARYNGOLOGY    Today's Date: 07/31/2016     Chief Complaint: left hearing loss    HPI:  Robin Green is a 60 y.o. year old female seen in follow up with h/o sudden left SNHL.  Patient saw Dr. Neil Green approximately 1 year ago for this diagnosis.  She was treated with 3 injections of dexamethasone with some improvement in hearing.  She denies associated tinnitus or vertigo.  She has persistent hearing loss on the left side which she finds very bothersome.  She is having difficulty isolating were sound is coming from.  She does not feel symptoms have worsened over the last year.                     ROS:  Denies previous history of CVA    Allergies:   Lisinopril    Active Medications:  Current Outpatient Prescriptions   Medication    verapamil (CALAN-SR) 240 MG CR tablet    Glucosamine-Chondroitin (OSTEO BI-FLEX REGULAR STRENGTH) 250-200 MG TABS    ALPRAZolam (XANAX) 0.25 MG tablet    amoxicillin (AMOXIL) 500 MG capsule    hydrochlorothiazide (MICROZIDE) 12.5 MG capsule    metFORMIN (GLUCOPHAGE) 500 MG tablet    aspirin 81 MG tablet    potassium chloride SA (KLOR-CON M10) 10 mEq  tablet    cholecalciferol (VITAMIN D) 1000 UNIT tablet    ALPRAZolam (XANAX) 0.5 MG tablet     No current facility-administered medications for this visit.        Electronic record reviewed for PMH, PSH    Physical Examination:  Healthy appearing 60 y.o. year old female  Blood pressure 176/83, pulse 65, height 1.626 m (5\' 4" ), weight 90.7 kg (200 lb).   Comprehensive ENT exam performed  EARS:external auditory canal patent bilaterally.  Both tympanic membranes are intact and mobile.  NOSE: Mild nasal congestion  ORAL:Oral mucosa is moist.  There are no oral cavity lesions.  Tongue has normal mobility.  Oropharyngeal exam reveals nonobstructing tonsils.  The posterior pharyngeal wall is without erythema.  LARYNX: Not visualized  NECK: No adenopathy    AUDIOGRAM: Normal hearing right ear, left ear moderate to severe  sensorineural hearing loss with 12% discrimination.  5-25 dB improvement in left ear but discrimination has decreased       Assessment:  60 year old female with sudden left sensorineural hearing loss without significant improvement in her discrimination is poor and she is not a candidate for a typical hearing aid.  She is a candidate for a CROS hearing aid and if she does not benefit from this unilateral cochlear implant could be recommended.  I reviewed options with her and have recommended a hearing aid evaluation.  She does not qualify for charity care but there may be other application she can file for financial assistance.  She should have an audiogram every 1-2 years      Robin LovelyHASE H Eino Whitner, MD as of 10:28 AM, 07/31/2016

## 2016-07-31 NOTE — Progress Notes (Signed)
AUDIOLOGIC EVALUATION     [facility]  Audiology   [address], [address]  [place],  [place] [id number]  Phone: [phone], Fax: [phone]     Outpatient Visit  Patient: Robin Green   MR Number: [id number]   Date of Birth: [birth year]   Date of Visit: 07/31/2016      PURE-TONE TEST RESULTS  Type of Testing: conventional      Test Reliability: good  Transducer: headphone, bone  Booth: 1  ANSI S3.21.2004 (R2009)     Air Conduction Testing (dB HL and kHz)     LEFT EAR RIGHT EAR     0.125 0.25 0.50  0.75 1.0 1.5 2.0 3.0 4.0 6.0 8.0  0.125 0.25 0.50 0.75 1.0 1.5 2.0 3.0 4.0 6.0 8.0     15 40 60 70   85   75   70      10 10   10   5   15   5                                                                     Effective Masking Level         25 40 40   50   45   40                            Bone Conduction Testing (dB HL and kHz)  Bone conduction was masked when appropriate      0.25 0.50  0.75 1.0 1.5 2.0 3.0 4.0     0.25 0.50 0.75 1.0 1.5 2.0 3.0 4.0      15 35   70   NR   NR                                                                                          Effective Masking Level    60 60   85   90   90                            SPEECH AUDIOMETRY     SAT SRT Score dB HL EML  Test  SAT SRT Score dB HL EML  Test       12 % 90 60 W-22 CD      100 % 45   W-22 CD   EML   EML            EML   EML               Please [id number] for additional information     Notes  Threshold in dB HL  Frequency in kiloHertz (kHz) Legend   dB=decibels  HL=Hearing Level  NR=No Response  VT=Vibro-Tactile EML=Effective Masking Level  SAT=Speech Awareness Threshold  SRT=Speech Reception  Threshold  MLV=Monitored Live Voice  CD=Compact Disk     HISTORY: Robin Green was seen for an audiological evaluation to determine whether patient is a candidate for medical, surgical or rehabilitative services.  Patient has a history of sudden hearing loss in the left ear on 07/13/2015 which [facility]  [facility]  The previous audiogram dated 10/07/2015 shows mild to profound sensorineural hearing loss, left ear, and normal hearing sensitivity, right ear. Patient reports her hearing seems improved in the left ear over the past six months and notes no change in the right ear. Patient will follow-up with Dr. Hyacinth MeekerMiller in otolaryngology immediately following today's appointment.     FINDINGS: Pure-tone test results indicate a mild sloping to severe sensorineural hearing loss from [phone] Hz.   Thresholds are 5-25 dB improved, left ear, an essentially unchanged, right ear, when compared to the previous audiogram dated 10/07/2015.  Speech recognition ability in quiet was very poor at very loud levels, left ear, and excellent at a soft conversational levels, right ear.      Today's test results were reviewed with the patient. The patient is a poor candidate for traditional amplification in the left ear, pending medical clearance. It was discussed with patient that she does not meet eligibility requirements for [facility].  Hearing aid technology and the benefits and limitations of amplification were briefly discussed. CROS hearing aid has been discussed in February 2017 and patient was not interested at that time.     RECOMMENDATIONS: Audiologic re-evaluation as per Dr. Hyacinth MeekerMiller.      Jenene SlickerMichelle R. Nyjah Denio, AuD, Landscape architectCCC-A  Audiologist  [facility]

## 2016-09-01 ENCOUNTER — Telehealth: Payer: Self-pay | Admitting: Otolaryngology

## 2016-09-01 ENCOUNTER — Ambulatory Visit: Payer: Self-pay | Admitting: Audiology

## 2016-09-01 DIAGNOSIS — H9042 Sensorineural hearing loss, unilateral, left ear, with unrestricted hearing on the contralateral side: Secondary | ICD-10-CM

## 2016-09-01 NOTE — Telephone Encounter (Signed)
Patient [facility] audiologist [facility] treatment options. She can trial topical cortisone cream bid to outer ear canals for 10-14 days and then as needed  Maurilio Lovelyhase H Everline Mahaffy, MD

## 2016-09-01 NOTE — Progress Notes (Signed)
HEARING AID EVALUATION      [facility]  [address]  [place], [place] [address]  Phone: [phone]  Fax: [phone]      Patient Name: Robin Green   MRN: [id number]   Date of Birth: [birth year]   Date of Service: 09/01/2016      Upon the referral of Dr. Salvadore Farberhase Miller, Robin Green was seen for a hearing aid evaluation. Results of her 07/31/16 audiometric exam indicating unilateral (LE) hearing loss were reviewed. We also reviewed her benefits coverage. As it was explained at the time of the 07/31/16 exam, patient does not meet audiometric eligibility for hearing aids through [facility]. Her primary insurance was also contacted and there is no benefit for hearing aids through that plan.    Patient expressed an understanding of the information discussed and will continue to have hearing monitored on an annual basis or sooner if any change in hearing is noted. We also discussed the importance of hearing protection and avoidance of noise. Some earplugs were provided for the patient to use when in high noise environments.      Recommendations: Annual audiometric reevaluations.    Matilde HaymakerPamela Bertran Zeimet, AuD., CCC-A  Clinical Audiologist  [facility]

## 2016-09-01 NOTE — Progress Notes (Signed)
[facility]  Audiology   [address]  [place],  [place] [id number]  Phone: [phone], Fax: [phone]       Patient: Robin Pattersonancy Green   MR Number: [id number]   Date of Birth: 01/26/1956         Hearing Aid Insurance Benefit Coverage Form    Date insurance was checked 09/01/16    Representative Name:      Does patient have a hearing aid benefit with their policy: No (Pt has CC 5)    Is hearing aid benefit subject to a deductible?      Deductible Amount $?      Is there a dollar amount maximum for the hearing aid benefit?      Maximum Amount ($)      Is the patient responsible for the balance of the cost of the hearing aids if the benefit doesn't cover the full cost?      Does the hearing aid benefit include the evaluation, earmolds and fitting fees?        Is there additional coverage for evaluation, earmolds and fitting fees?      Is the patient currently eligible for the hearing aid benefit?      Is there an out of pocket maximum for patient's insurance?      Out of pocket maximum amount?      Additional Notes:    Reference Number:      Reviewed by patient:  Initials:________  Date:___________

## 2016-09-01 NOTE — Telephone Encounter (Signed)
Call to pt to relay the information below. Patient verbalized understanding to all and will call back with any further questions or concerns.

## 2016-12-07 ENCOUNTER — Encounter: Payer: Self-pay | Admitting: Gastroenterology

## 2016-12-07 DIAGNOSIS — M79672 Pain in left foot: Secondary | ICD-10-CM

## 2016-12-07 DIAGNOSIS — E119 Type 2 diabetes mellitus without complications: Secondary | ICD-10-CM

## 2016-12-07 DIAGNOSIS — R51 Headache: Secondary | ICD-10-CM

## 2016-12-07 DIAGNOSIS — E782 Mixed hyperlipidemia: Secondary | ICD-10-CM

## 2016-12-07 DIAGNOSIS — E669 Obesity, unspecified: Secondary | ICD-10-CM

## 2016-12-07 DIAGNOSIS — H9122 Sudden idiopathic hearing loss, left ear: Secondary | ICD-10-CM

## 2016-12-07 DIAGNOSIS — I10 Essential (primary) hypertension: Secondary | ICD-10-CM

## 2016-12-29 ENCOUNTER — Ambulatory Visit: Payer: Medicare Other | Attending: Podiatry | Admitting: Podiatry

## 2016-12-29 ENCOUNTER — Encounter: Payer: Self-pay | Admitting: Podiatry

## 2016-12-29 VITALS — Ht 64.0 in | Wt 200.0 lb

## 2016-12-29 DIAGNOSIS — E119 Type 2 diabetes mellitus without complications: Secondary | ICD-10-CM

## 2016-12-29 DIAGNOSIS — M19072 Primary osteoarthritis, left ankle and foot: Secondary | ICD-10-CM

## 2016-12-29 DIAGNOSIS — M79672 Pain in left foot: Secondary | ICD-10-CM

## 2016-12-29 MED ORDER — GENERIC DME *A*
0 refills | Status: DC
Start: 2016-12-29 — End: 2018-09-29

## 2016-12-29 NOTE — Progress Notes (Signed)
Subjective:     Chief Complaint   Patient presents with    Follow-up     Diabetes    Seen for diabetic foot care has been having pain of the dorsal feet  Hemoglobin A1C4/10/2016  [facility]  Component Name Value Ref Range   HEMOGLOBIN A1C 6.6 (H)   Comment:    Samples containing high amounts of HbF (>10%) may yield a  lower than expected HbA1C result. For blood samples  containing HbF>10%, please order test #2007. 4.0 - 6.0 %   EAVERAGE GLUCOSE 143 (H) 68 - 126 mg/dL       Patient ID: Robin Green is a 61 y.o. female.    Allergy History as of 12/29/16     LISINOPRIL       Noted Status Severity Type Reaction    07/14/15 1533 Gavin Pound, RN 07/14/15 Active Low Allergy Other (See Comments)    Comments:  Dizziness and cough                  Patient's medications, allergies, past medical, surgical, social and family histories were reviewed and updated as appropriate, there are no changes with the exception of none          Objective:   Physical Exam  Psychiatric:   Alert and oriented to person place and time   Appears of normal mood and affect     CONSTITUTION: The patient appears well and is in no apparent distress, the patient is alert and oriented.  Pedal vascular exam:  Dorsalis pedis pulse 1+ bilaterally. Posterior tibial pulse 1+ bilaterally. Extremities: No edema of the lower limbs bilaterally. No varicosities noted. Capillary refill in the toes bilaterally is < 2 seconds. Gradual temperature decrease bilaterally.    Skin: No scars, rashes, lesions or ecchymosis on the lower extremities. Normal hair.    Neuro: Sensation intact to light touch. Sensation intact to pinprick. Sensation intact to monofilament. Sensation intact to vibration. Proprioception is intact bilaterally.    Reflexes: DTR's are normal and symmetric bilaterally. Babinski's reflex is negative and symmetrical bilaterally.    MUSCULOSKELETAL :Patient has pes planus feet. There is hyper pronation on weight  bearing with eversion of the calcaneus, and abduction of the forefoot.  Pain of the left Lisfranc' joint on ROM        Assessment:         1. Pain in left foot  generic DME   2. DJD (degenerative joint disease), ankle and foot, left  generic DME   3. Diabetes mellitus with no complication  generic DME       Plan:      Diabetic foot care performed the patient today. The patient's nails and hyperkeratotic lesions were debrided. There are no signs of infection and or cellulitis. Web-spaces are clear of interdigital tinea pedis. There no ulcerative areas or breaks in the skin. The patient was counseled on proper glycemic control. The patient will be seen in 2 months for follow-up care.         Will get carbon fiber plate for her OA left foot

## 2017-02-20 ENCOUNTER — Encounter: Payer: Self-pay | Admitting: Primary Care

## 2017-02-24 ENCOUNTER — Other Ambulatory Visit: Payer: Self-pay | Admitting: Primary Care

## 2017-02-24 MED ORDER — METFORMIN HCL 500 MG PO TABS *I*
500.0000 mg | ORAL_TABLET | Freq: Two times a day (BID) | ORAL | 1 refills | Status: DC
Start: 2017-02-24 — End: 2017-08-25

## 2017-02-24 NOTE — Telephone Encounter (Signed)
MM udpated, Please erx, next ov scheduled 7/18

## 2017-02-25 ENCOUNTER — Encounter: Payer: Self-pay | Admitting: Primary Care

## 2017-02-25 DIAGNOSIS — E669 Obesity, unspecified: Secondary | ICD-10-CM

## 2017-02-25 DIAGNOSIS — G5600 Carpal tunnel syndrome, unspecified upper limb: Secondary | ICD-10-CM | POA: Insufficient documentation

## 2017-02-25 DIAGNOSIS — I10 Essential (primary) hypertension: Secondary | ICD-10-CM | POA: Insufficient documentation

## 2017-02-25 DIAGNOSIS — M179 Osteoarthritis of knee, unspecified: Secondary | ICD-10-CM | POA: Insufficient documentation

## 2017-02-25 DIAGNOSIS — E119 Type 2 diabetes mellitus without complications: Secondary | ICD-10-CM | POA: Insufficient documentation

## 2017-02-25 DIAGNOSIS — H9120 Sudden idiopathic hearing loss, unspecified ear: Secondary | ICD-10-CM | POA: Insufficient documentation

## 2017-02-25 DIAGNOSIS — M1710 Unilateral primary osteoarthritis, unspecified knee: Secondary | ICD-10-CM | POA: Insufficient documentation

## 2017-03-10 ENCOUNTER — Encounter: Payer: Self-pay | Admitting: Gastroenterology

## 2017-03-10 LAB — MICROALBUMIN, URINE, RANDOM
Creatinine,UR: 91.4 mg/dL
Microalbumin,UR: <3 mg/dL (ref 0.0–23.0)

## 2017-03-10 LAB — LIPID PANEL
Chol/HDL Ratio: 2.3
Cholesterol: 179 mg/dL (ref 1–199)
HDL: 79 mg/dL — ABNORMAL HIGH (ref 40–60)
LDL Calculated: 90 mg/dL (ref <100)
Non HDL Cholesterol: 100 mg/dL (ref <130)
Triglycerides: 49 mg/dL (ref <150)

## 2017-03-10 LAB — HEMOGLOBIN A1C: Hemoglobin A1C: 6.1 %

## 2017-03-16 ENCOUNTER — Encounter: Payer: Self-pay | Admitting: Primary Care

## 2017-03-16 NOTE — Progress Notes (Signed)
Outside  Labs  for review

## 2017-03-17 ENCOUNTER — Encounter: Payer: Self-pay | Admitting: Primary Care

## 2017-03-17 ENCOUNTER — Ambulatory Visit: Payer: Medicare (Managed Care) | Attending: Primary Care | Admitting: Primary Care

## 2017-03-17 VITALS — BP 130/80 | HR 78 | Resp 16 | Ht 64.0 in | Wt 196.0 lb

## 2017-03-17 DIAGNOSIS — E782 Mixed hyperlipidemia: Secondary | ICD-10-CM | POA: Insufficient documentation

## 2017-03-17 DIAGNOSIS — Z0000 Encounter for general adult medical examination without abnormal findings: Secondary | ICD-10-CM

## 2017-03-17 DIAGNOSIS — I10 Essential (primary) hypertension: Secondary | ICD-10-CM

## 2017-03-17 DIAGNOSIS — R7303 Prediabetes: Secondary | ICD-10-CM

## 2017-03-17 DIAGNOSIS — E669 Obesity, unspecified: Secondary | ICD-10-CM

## 2017-03-17 DIAGNOSIS — G4700 Insomnia, unspecified: Secondary | ICD-10-CM

## 2017-03-17 DIAGNOSIS — H9122 Sudden idiopathic hearing loss, left ear: Secondary | ICD-10-CM

## 2017-03-17 LAB — HM HIV SCREENING OFFERED

## 2017-03-17 MED ORDER — AMOXICILLIN 500 MG PO CAPS *I*
ORAL_CAPSULE | ORAL | Status: DC
Start: 2017-03-17 — End: 2022-12-22

## 2017-03-17 NOTE — Progress Notes (Signed)
[facility]  Progress Note    Reason For Visit   Adult Comprehensive Physical Exam.    HPI   Today has concerns SA:YTKZS well. No concerns    The Problem List, MHx, SHx, FHx, Immunizations, Medications, and Allergies were reviewed with the patient today during the appointment and updates were made as appropriate.    Allergies   Allergen Reactions    Lisinopril Other (See Comments)     Dizziness and cough      Current Outpatient Prescriptions   Medication Sig Dispense Refill    diclofenac (VOLTAREN) 1 % gel Apply 2g topically 4 times daily      metFORMIN (GLUCOPHAGE) 500 MG tablet Take 1 tablet (500 mg total) by mouth 2 times daily (with meals) 180 tablet 1    blood glucose monitor (FREESTYLE FREEDOM LITE) kit Use daily for BG testing      blood glucose test strip Test 3-4 times a day. Freestyle Freedom. Dx: DM-2      Non-System Medication Abbott freestyle lifestyle touch dx: DM-2      POTASSIUM PO Take 595 mg by mouth daily      generic DME Carbon fiber plate for DJD mid foot left H/O diabetes 1 each 0    verapamil (CALAN-SR) 240 MG CR tablet TK 1 T PO QD WITH FOOD  1    Glucosamine-Chondroitin (OSTEO BI-FLEX REGULAR STRENGTH) 250-200 MG TABS Take by mouth      aspirin 81 MG tablet Take 81 mg by mouth daily      cholecalciferol (VITAMIN D) 1000 UNIT tablet Take 2,000 Units by mouth daily         amoxicillin (AMOXIL) 500 MG capsule Prior to dental procedures      hydrochlorothiazide (MICROZIDE) 12.5 MG capsule Take 25 mg by mouth every morning      Non-System Medication BP cuff, large Dx: HTN      nystatin (MYCOSTATIN) ointment 30GM Use topically twice daily on affected area[s]      ALPRAZolam (XANAX) 0.25 MG tablet TK 1 T PO QD PRN. MDD 1 T.  0     No current facility-administered medications for this visit.      Patient Active Problem List    Diagnosis Date Noted    Hyperlipidemia, mixed 03/17/2017    Sudden hearing loss 02/25/2017    Obesity (BMI 30-39.9) 02/25/2017    Type 2 diabetes  mellitus 02/25/2017    Carpal tunnel syndrome 02/25/2017    HTN (hypertension) 02/25/2017    Osteoarthritis, knee 02/25/2017    Presence of right artificial knee joint 12/25/2015     Past Medical History:   Diagnosis Date    Diabetes mellitus     Hypertension      Past Surgical History:   Procedure Laterality Date    BREAST BIOPSY  2008    CHOLECYSTECTOMY, LAPAROSCOPIC      JOINT REPLACEMENT      left TKR    KNEE REPLACEMENT  2008/2017    x2     Family History   Problem Relation Age of Onset    Breast cancer Mother     Diabetes Sister     Hypertension Sister     Diabetes Maternal Uncle     Coronary art dis Maternal Uncle     Prostate cancer Father     Dementia Father      Social History     Social History    Marital status: Widowed     Spouse  name: N/A    Number of children: N/A    Years of education: N/A     Social History Main Topics    Smoking status: Never Smoker    Smokeless tobacco: Never Used    Alcohol use No    Drug use: No    Sexual activity: Not Currently     Other Topics Concern    None     Social History Narrative        ROS     ROS   Systemic symptoms: Not feeling tired (fatigue).  No fever, no chills, Head symptoms: No headache.  Eye symptoms: No vision problems.  Otolaryngeal symptoms: positive  hearing loss, no earache, and no tinnitus.  No nasal symptoms  and no sore throat.  Cardiovascular symptoms: No chest pain or discomfort, no palpitations, and no intermittent leg claudication.  Pulmonary symptoms: No dyspnea, no cough, and no wheezing.  Gastrointestinal symptoms: Normal appetite, no heartburn, no nausea, no vomiting, no abdominal pain, no change in stool, no diarrhea, and no constipation.  Genitourinary symptoms: No hematuria  and no increase in urinary frequency.  No dysuria.  Hematologic symptoms: No easy bleeding  and no tendency for easy bruising.  Musculoskeletal symptoms: No back pain  and no myalgias.  No localized joint pain  and no localized joint  swelling.  Neurological symptoms: No dizziness, no lightheadedness, no fainting, no memory lapses or loss, no motor disturbances, and no sensory disturbances.  Psychological symptoms: No anxiety  and no depression.  Skin symptoms: No pruritus  and no rash.    Vitals  Blood pressure 130/80, pulse 78, resp. rate 16, height 1.626 m ('5\' 4"' ), weight 88.9 kg (196 lb).      Physical Exam   Constitutional: She is oriented to person, place, and time. No distress.   Obese black female in no distress   HENT:   Head: Normocephalic and atraumatic.   Right Ear: External ear normal.   Left Ear: External ear normal.   Nose: Nose normal.   Mouth/Throat: Oropharynx is clear and moist. No oropharyngeal exudate.   Eyes: Pupils are equal, round, and reactive to light. Right eye exhibits no discharge. Left eye exhibits no discharge. No scleral icterus.   Neck: Normal range of motion. Neck supple. No JVD present. No thyromegaly present.   Cardiovascular: Normal rate, regular rhythm and normal heart sounds.  Exam reveals no gallop.    No murmur heard.  Pulmonary/Chest: Effort normal and breath sounds normal. No respiratory distress. She has no wheezes. She has no rales. She exhibits no tenderness.   Abdominal: Soft. Bowel sounds are normal. She exhibits no distension and no mass. There is no tenderness. There is no rebound and no guarding.   Musculoskeletal: Normal range of motion.   Good bil pedal pulses. Normal monofilament testing in both feet,   Lymphadenopathy:     She has no cervical adenopathy.   Neurological: She is alert and oriented to person, place, and time. No cranial nerve deficit.   Skin: Skin is warm and dry. She is not diaphoretic.   Psychiatric: Mood and affect normal.       Lab Results  No results found for: WBC, HGB, HCT, MCV, PLT    Chemistry    No results for input(s): NA, PLNA, K, PLASK, CL, PLCL, CO2, PLCO2, GFRC, GFRCG, GFRB, GFRBG, UN, PLUN, CREAT in the last 8760 hours. No results for input(s): GLU, PLGLU, CA,  CAPLASMA, TP, PLTRR, ALB, ALBUMINPL, ALT, PLALT,  AST, PLAST, ALK, ALKPHOSPL, TB, TBILIPL in the last 8760 hours.       Lab Results   Component Value Date    CHOL 179 03/10/2017    HDL 79 (H) 03/10/2017    LDLC 90 03/10/2017    TRIG 49 03/10/2017    [place] 2.3 03/10/2017     No results found for: TSH  Hemoglobin A1C   Date Value Ref Range Status   03/10/2017 6.1 % Final     EKG done today shows sinus rhythm at 58 bpm.  Normal P axis no ST-T changes.    Assessment   This is a 61 y.o.female who comes in today for a physical exam    1. Routine medical exam  UTD on routine preventive care. Reviewed healthy lifestyle measures.work on wt loss. UTD on immunisations. Reviewed shingles vaccine.    2. Essential hypertension  Recommend low-salt diet.  Recommend exercise for 30 minutes most days of the week.  Maintain a healthy weight.  .  Goal blood pressure at 130/80.   - CBC; Future  - Comprehensive metabolic panel; Future  - Lipid add Rfx to Drt LDL if Trig >400; Future  - Urinalysis with reflex to culture; Future    3. Hyperlipidemia, mixed  Recommend low cholesterol diet.  Exercise as much as possible.    Check labs as ordered.    4. Obesity (BMI 30-39.9)  Continue to work on wt loss    5. Sudden left hearing loss  Managed by ENT    6. Prediabetes  Recommend low carb diet.  Maintain a healthy weight.  Exercise 30 minutes most days of the week.  Check labs as ordered.  - Hemoglobin A1c; Future  - Microalbumin, Urine, Random; Future    7. Insomnia  Will try melatonin OTC and call back if it does not help. Can try trazodone then.           Follow up: Return in about 6 months (around 09/17/2017) for Follow-up.    Author: Earna Coder, MD  Note created: 03/18/2017  at: 10:43 PM

## 2017-03-17 NOTE — Patient Instructions (Signed)
Take melatonin over the counter and call if it does not work and you want to try something prescription

## 2017-03-29 ENCOUNTER — Other Ambulatory Visit: Payer: Self-pay | Admitting: Primary Care

## 2017-03-29 MED ORDER — HYDROCHLOROTHIAZIDE 12.5 MG PO CAPS *I*
25.0000 mg | ORAL_CAPSULE | Freq: Every morning | ORAL | 1 refills | Status: DC
Start: 2017-03-29 — End: 2017-11-05

## 2017-03-29 NOTE — Telephone Encounter (Signed)
MM udpated, Please erx, next ov scheduled 1/18

## 2017-03-30 ENCOUNTER — Other Ambulatory Visit
Admission: RE | Admit: 2017-03-30 | Discharge: 2017-03-30 | Disposition: A | Discharge status: Home / Self Care | Payer: Medicare (Managed Care) | Source: Ambulatory Visit | Attending: Obstetrics | Admitting: Obstetrics

## 2017-03-30 DIAGNOSIS — Z01419 Encounter for gynecological examination (general) (routine) without abnormal findings: Secondary | ICD-10-CM | POA: Insufficient documentation

## 2017-03-30 DIAGNOSIS — Z124 Encounter for screening for malignant neoplasm of cervix: Secondary | ICD-10-CM | POA: Insufficient documentation

## 2017-04-01 LAB — GYN CYTOLOGY

## 2017-05-13 ENCOUNTER — Other Ambulatory Visit: Payer: Self-pay | Admitting: Gastroenterology

## 2017-05-13 LAB — HM MAMMOGRAPHY

## 2017-05-21 ENCOUNTER — Other Ambulatory Visit: Payer: Self-pay | Admitting: Primary Care

## 2017-05-21 MED ORDER — VERAPAMIL HCL CR 240 MG PO TBCR *I*
240.0000 mg | ORAL_TABLET | Freq: Every evening | ORAL | 1 refills | Status: DC
Start: 2017-05-21 — End: 2017-11-12

## 2017-05-21 NOTE — Telephone Encounter (Signed)
MM udpated, Please erx, next ov scheduled 1/18

## 2017-05-21 NOTE — Telephone Encounter (Signed)
Outside  mammo for review

## 2017-06-01 ENCOUNTER — Telehealth: Payer: Self-pay | Admitting: Primary Care

## 2017-06-01 NOTE — Telephone Encounter (Addendum)
Call from [name] who states that her right ear is starting to feel the same way her left one did before she lost her hearing completely.   Robin Green said that she has to use her hand on her right ear to help with the pressure. Please advise.

## 2017-06-01 NOTE — Telephone Encounter (Signed)
Spoke with pt she stated that she feels a fullness in her right ear, and is due to travel next week by airplane.  Pt states that she currently sees an ENT for troubles with her left ear that have the same symptoms of her right.  Pt declined UC this evening, she will contact ENT tomorrow for OV, pt will call back if cannot be seen there

## 2017-06-02 ENCOUNTER — Ambulatory Visit: Payer: Medicare (Managed Care) | Attending: Primary Care | Admitting: Otolaryngology

## 2017-06-02 ENCOUNTER — Encounter: Payer: Self-pay | Admitting: Otolaryngology

## 2017-06-02 ENCOUNTER — Ambulatory Visit: Payer: Medicare (Managed Care) | Attending: Primary Care | Admitting: Audiologist-Hearing Aid Fitter

## 2017-06-02 ENCOUNTER — Other Ambulatory Visit: Payer: Self-pay

## 2017-06-02 VITALS — BP 139/75 | HR 67 | Temp 97.9°F | Ht 64.0 in | Wt 196.7 lb

## 2017-06-02 DIAGNOSIS — H9122 Sudden idiopathic hearing loss, left ear: Secondary | ICD-10-CM

## 2017-06-02 DIAGNOSIS — H938X1 Other specified disorders of right ear: Secondary | ICD-10-CM

## 2017-06-02 DIAGNOSIS — H9042 Sensorineural hearing loss, unilateral, left ear, with unrestricted hearing on the contralateral side: Secondary | ICD-10-CM | POA: Insufficient documentation

## 2017-06-02 NOTE — Progress Notes (Signed)
Subjective:      Robin Green is a 61 y.o. female who presents on 06/02/17 to the same day ENT consult service with concerns of bilateral ear fullness. She's familiar to our office for sudden left SNHL. She has DM and was treated by Dr. Neil Crouch with 3 injections of dexamethasone with some improvement in hearing - though overall is non functional. Since being seen she reports stability of symptoms until about a week ago. This is felt in both ears. No obvious seasonal allergies. No pain. No tinnitus. No otorrhea. No recent URI. No bruxism. She'll be flying to [place] next week to help her sister who will be undergoing surgery. This has been very stressful for her. She already feels like she's on an airplane.     Objective:       BP 139/75   Pulse 67   Temp 36.6 C (97.9 F) (Temporal)    Ht 1.626 m ( )   Wt 89.2 kg (196 lb 11.2 oz)   BMI 33.76 kg/m2   Pain Score:  0 - No pain       General:   Normocephalic, well nourished, with appropriate affect in NAD.  A &O x 3.    NEURO:  CN II-XII grossly intact.       Head and Face:  The head and face reveal normal facial symmetry without lesions or scars. The salivary glands are palpated and appear normal without tenderness or mass.    EYES:  Non icteric, normal conjunctiva.  EOMI.   Ears: RIGHT EAR:  Ear pinna is normal in appearance with no scars, lesions or masses. Mastoid bone is non tender. The ear canal is clear.  The tympanic membrane is intact without inflammation or perforation. TM is mobile to pneumatic otoscopy without evidence of middle ear effusion.     LEFT EAR:  Ear pinna is normal in appearance with no scars, lesions or masses. Mastoid bone is non tender. The ear canal is clear. The tympanic membrane is intact without inflammation or perforation. TM is mobile to pneumatic otoscopy without evidence of middle ear effusion.      Assessment:       62 y.o. female with right aural pressure with normal testing, left known sensorineural hearing loss - stable      Plan:     With only one functional ear I ordered an urgent audiogram. The audiogram returned with stability. She was reassured there is no inner ear disease process occurring in her good ear. 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 A handout was given to the patient describing how to treat it. She will follow up on an as needed basis. It has been a pleasure participating in the care of your patient. Thank you for your referral to [facility].  If you have any questions, please do not hesitate to contact me.

## 2017-06-02 NOTE — Patient Instructions (Signed)
What Is TMJ?    You may not have heard of it, but you use it hundreds of times every day. It is the Temporo-Mandibular Joint (TMJ), the joint where the mandible (the lower jaw) joins the temporal bone of the skull, immediately in front of the ear on each side of your head. A small disc of cartilage separates the bones, much like in the knee joint, so that the mandible may slide easily; each time you chew you move it. But you also move it every time you talk and each time you swallow (every three minutes or so). It is, therefore, one of the most frequently used of all joints of the body and one of the most complex.   You can locate this joint by putting your finger on the triangular structure in front of your ear. Then move your finger just slightly forward and press firmly while you open your jaw all the way and shut it. You can also feel it by putting a finger inside your ear canal and press forward while the jaw is moving. This motion is the TMJ.  These maneuvers can cause considerable discomfort to a patient who is having TMJ trouble, and providers use these maneuvers with patients for diagnosis.     How Does the TMJ Work?   When you bite down hard, you put force on the object between your teeth and on the joint. In terms of physics, the jaw is the lever and the TMJ is the fulcrum. Actually, more force is applied (per square foot) to the joint surface than to whatever is between your teeth. To accommodate such forces and to prevent too much wear and tear, the cartilage between the mandible and skull normally provides a smooth surface, over which the joint can freely slide with minimal friction.   Therefore, the forces of chewing can be distributed over a wider surface in the joint space and minimize the risk of injury. In addition, several muscles contribute to opening and closing the jaw and aid in the function of the TMJ.   Symptoms include, but are not limited to :   • Ear pain   • Ear  pressure/fullness  • Ringing in the ears  • Sore jaw muscles   • Temple/cheek pain   • Jaw popping/clicking   • Locking of the jaw   • Difficulty in opening the mouth fully   • Frequent head/neck aches     How Does TMJ Dysfunction Feel?   The pain may be sharp and searing, occurring each time you swallow, yawn, talk, or chew, or it may be dull and constant. It hurts over the joint, immediately in front of the ear, but pain can also radiate elsewhere around the ear and often deep inside the ear. In time, it can also effect the other ear, due to anatomy of the jaw. Spasms in the adjacent muscles that are attached to the bones of the skull, face, and jaws can be felt at the side of the head (the temple), the neck musculature right behind the ear, the cheek, the lower jaw, and the teeth.   A very common focus of pain is in the ear. Many patients come to the ear specialist quite convinced their pain is from an ear infection. Some of these patients have been misdiagnosed with ear fluid or other ear pathology. When the earache is not associated with a hearing loss and the eardrum is deemed normal by an ear specialist or a   variant of normal, the provider will consider the possibility that the pain is coming from TMJ dysfunction.  There are a few other symptoms besides pain that TMJ dysfunction can cause. It can make popping, clicking, or grinding sounds when the jaws are opened widely. Or the jaw locks wide open (dislocated). At the other extreme, TMJ dysfunction can prevent the jaws from fully opening. Some people get ringing in their ears from TMJ trouble. It is important to know that TMJ cannot cause nerve damage with hearing loss. Your ENT provider can order a hearing test to check the nerve of hearing if felt appropriate.    How Can Things Go Wrong with the TMJ?  In most patients, pain associated with the TMJ is a result of displacement of the cartilage disc that causes pressure and stretching of the associated sensory  nerves. The popping or clicking occurs when the disk snaps into place when the jaw moves. In addition, the chewing muscles may spasm, not function efficiently, and cause pain and tenderness.  Both major and minor trauma to the jaw can significantly contribute to the development of TMJ problems. This can occur with car accidents or assault. If you habitually clench, grit, or grind your teeth, you increase the wear on the cartilage lining of the joint, and it doesn't have a chance to recover. Many persons are unaware that they grind their teeth, unless someone tells them so, like a dentist or bed partner.   Chewing gum much of the day can cause similar problems. Stress and other psychological factors have also been implicated as contributory factors to TMJ dysfunction. Other causes include teeth that do not fit together properly (improper bite), malpositioned jaws (congenital or acquired such as a loss of teeth), and arthritis. In certain cases, chronic malposition of the cartilage disc and persistent wear in the cartilage lining of the joint space can cause further damage.    What Can Be Done?   Because TMJ symptoms often develop in the head and neck, Otolaryngologists are appropriately qualified to diagnose TMJ problems. Proper diagnosis of TMJ begins with a detailed history and physical, including careful assessment of the teeth occlusion and function of the jaw joints and muscles. If the provider diagnoses your case early, it will probably respond to these simple, self-remedies:   • Rest the muscles and joints by eating soft foods for two weeks.   • Do not chew gum.   • Try your best to avoid clenching or tensing during the day.   • Relax muscles with moist heat (1/2 hour at least twice daily).   • Topical pain creams like Bengay, IcyHot, Vicks vaporub as needed. Can be rubbed into cotton ball and placed in outer ear canal - Don't get it stuck inside!  • Stress reduction.  In cases of joint injury, ice packs  applied soon after the injury can help reduce swelling. Relaxation techniques and stress reduction, patient education, non-steroidal anti-inflammatory drugs, muscle relaxants or other medications may be indicated in a dose your provider recommends.   Other therapies may include fabrication of an occlusal splint to prevent wear and tear on the joint and teeth. Improving the alignment of the upper and lower teeth and surgical options are available for advanced cases by TMJ specialists, who are oral surgeons. After diagnosis, your otolaryngologist may suggest further consultation with your dentist and oral surgeon to facilitate effective management of TMJ dysfunction. Another option can be to see a physical therapist who specializes in treatment   of TMJ disorders for treatment that is not surgical or medical.     [place], Orthodontics/TMJ Orofacial pain clinic phone number:  [phone]

## 2017-06-02 NOTE — Progress Notes (Signed)
AUDIOLOGIC EVALUATION     [facility]  Audiology   [address] [place], [address]  [place],  [place]  [address]  Phone: [phone], Fax: [phone]     Outpatient Visit  Patient: Robin Green   MR Number: [id number]   Date of Birth: 02/17/1956   Date of Visit: 06/02/2017      PURE-TONE TEST RESULTS  Type of Testing: conventional      Test Reliability: good  Transducer: headphone  Booth: 3  ANSI S3.21.2004 (R2009)     Air Conduction Testing (dB HL and kHz)     LEFT EAR RIGHT EAR     0.125 0.25 0.50  0.75 1.0 1.5 2.0 3.0 4.0 6.0 8.0  0.125 0.25 0.50 0.75 1.0 1.5 2.0 3.0 4.0 6.0 8.0     35 45 65 80   90   85   80      Effective Masking Level         25 40 55   65   60   50                            Bone Conduction Testing (dB HL and kHz)  Bone conduction was masked when appropriate      0.25 0.50  0.75 1.0 1.5 2.0 3.0 4.0     0.25 0.50 0.75 1.0 1.5 2.0 3.0 4.0      35 45   NR   NR   NR                                                                                          Effective Masking Level    65 60   85   80   80                            SPEECH AUDIOMETRY     SAT SRT Score dB HL EML  Test  SAT SRT Score dB HL EML  Test       20 % 100 70 W-22 CD      100 % 50   W-22 CD   EML   EML            EML   EML               Please refer to scanned [facility] [id number] MR for additional information     Notes  Threshold in dB HL  Frequency in kiloHertz (kHz) Legend   dB=decibels  HL=Hearing Level  NR=No Response  VT=Vibro-Tactile EML=Effective Masking Level  SAT=Speech Awareness Threshold  SRT=Speech Reception  Threshold  MLV=Monitored Live Voice  CD=Compact Disk       HISTORY: Patient was referred by Theodosia Blender, PA for an audiological evaluation to determine whether patient is a candidate for medical, surgical or rehabilitative services.  Patient has a history of sudden hearing loss in the left ear on 07/13/2015 which was treated by  [facility].  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 Patient feels both ear have been plugged for the past week and she questions a change in hearing.    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  There has been a significant decrease in hearing at several frequencies, left ear, and unchanged, right ear, as compared to previous evaluation dated 07/31/2016.  Speech recognition ability in quiet was very poor, left ear, and excellent, right ear, when speech was presented at a very loud level, left ear, and at a normal conversational level, right ear.    TYMPANOMETRY:  Tracings were not saved but they did show normal Eustachian tube function, bilaterally.    RECOMMENDATIONS: Audiological re-evaluation as per Theodosia Blender, PA.    Kyle Stansell A. Nechama Guard, Au.D., CCC-A  Audiologist  [facility]

## 2017-06-04 ENCOUNTER — Other Ambulatory Visit: Payer: Self-pay | Admitting: Primary Care

## 2017-06-04 MED ORDER — ALPRAZOLAM 0.25 MG PO TABS *I*
0.2500 mg | ORAL_TABLET | Freq: Two times a day (BID) | ORAL | 0 refills | Status: DC | PRN
Start: 2017-06-04 — End: 2018-05-17

## 2017-06-04 NOTE — Telephone Encounter (Signed)
06/15/2016 06/16/2016 alprazolam 0.25 mg tablet  60 60 Hetty Ely MD Medicare [facility] [phone]     PMP ck'd, next ov scheduled 1/18, please erx

## 2017-06-23 ENCOUNTER — Encounter: Payer: Self-pay | Admitting: Gastroenterology

## 2017-07-06 ENCOUNTER — Other Ambulatory Visit: Payer: Self-pay | Admitting: Primary Care

## 2017-07-06 MED ORDER — NYSTATIN 100000 UNIT/GM EX OINT *I*
TOPICAL_OINTMENT | Freq: Two times a day (BID) | CUTANEOUS | 2 refills | Status: DC | PRN
Start: 2017-07-06 — End: 2018-07-15

## 2017-07-06 NOTE — Telephone Encounter (Signed)
Next ov 09-17-17

## 2017-07-26 ENCOUNTER — Encounter: Payer: Self-pay | Admitting: Gastroenterology

## 2017-08-09 ENCOUNTER — Ambulatory Visit: Payer: Medicare (Managed Care) | Attending: Optometry | Admitting: Ophthalmology

## 2017-08-09 ENCOUNTER — Encounter: Payer: Self-pay | Admitting: Ophthalmology

## 2017-08-09 VITALS — BP 137/81 | HR 76

## 2017-08-09 DIAGNOSIS — H40003 Preglaucoma, unspecified, bilateral: Secondary | ICD-10-CM

## 2017-08-09 NOTE — Progress Notes (Signed)
HPI     61F NPV in for glaucoma ck.Referred by optom at [facility]. States no   c/o.  Glaucoma suspect in both eye    Last edited by Alphonzo Cruise, MD on 08/09/2017 10:24 AM. (History)        No current outpatient prescriptions on file. (Ophthalmic)       Current Outpatient Prescriptions (Other)   Medication Sig Dispense Refill    acetaminophen (TYLENOL) 500 mg tablet Take 500 mg by mouth every 6 hours as needed for Pain      nystatin (MYCOSTATIN) ointment Apply topically 2 times daily as needed   30GM Use topically twice daily on affected area[s] 30 g 2    ALPRAZolam (XANAX) 0.25 MG tablet Take 1 tablet (0.25 mg total) by mouth 2 times daily as needed for Anxiety   Max daily dose: 0.5 mg 60 tablet 0    verapamil (CALAN-SR) 240 MG CR tablet Take 1 tablet (240 mg total) by mouth nightly   Swallow whole. Do not crush, break, or chew. 90 tablet 1    hydrochlorothiazide (MICROZIDE) 12.5 MG capsule Take 2 capsules (25 mg total) by mouth every morning 180 capsule 1    amoxicillin (AMOXIL) 500 MG capsule Prior to dental procedures      metFORMIN (GLUCOPHAGE) 500 MG tablet Take 1 tablet (500 mg total) by mouth 2 times daily (with meals) 180 tablet 1    blood glucose monitor (FREESTYLE FREEDOM LITE) kit Use daily for BG testing      blood glucose test strip Test 3-4 times a day. Freestyle Freedom. 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 H/O diabetes 1 each 0    Glucosamine-Chondroitin (OSTEO BI-FLEX REGULAR STRENGTH) 250-200 MG TABS Take by mouth      aspirin 81 MG tablet Take 81 mg by mouth daily      cholecalciferol (VITAMIN D) 1000 UNIT tablet Take 2,000 Units by mouth daily         penicillin v potassium (VEETIDS) 500 MG tablet TAKE 4 TABLETS ONE HOUR BEFORE APPT AS DIRECTED      diclofenac (VOLTAREN) 1 % gel  Apply 2g topically 4 times daily         Past Medical History:   Diagnosis Date    Anxiety     Diabetes     Diabetes mellitus     Hypertension        Family History   Problem Relation Age of Onset    Breast cancer Mother     Diabetes Sister     Hypertension Sister     Diabetes Maternal Uncle     Coronary art dis Maternal Uncle     Prostate cancer Father     Dementia Father        Social History     Social History    Marital status: Widowed     Spouse name: N/A    Number of children: N/A    Years of education: N/A     Social History Main Topics    Smoking status: Never Smoker    Smokeless tobacco: Never Used    Alcohol use No    Drug use: No    Sexual activity: Not  Currently     Other Topics Concern    Not on file     Social History Narrative    No narrative on file       ROS     Positive for: Eyes (conf)    Negative for: Constitutional, Gastrointestinal, Neurological, Skin,   Genitourinary, Musculoskeletal, HENT, Endocrine, Cardiovascular,   Respiratory, Psychiatric, Allergic/Imm, Heme/Lymph    Last edited by Alphonzo Cruise, MD on 08/09/2017 10:24 AM. (History)        Base Eye Exam     Visual Acuity (Snellen - Linear)       Right Left    Dist cc 20/20 20/20-2    Near cc J1+ J1+    Correction:  Glasses          Tonometry (Tonopen, 9:49 AM)       Right Left    Pressure 18 19          Tonometry #2 (Applanation, 10:30 AM)       Right Left    Pressure 14 14          Pachymetry (08/09/2017)       Right Left    Thickness 659 657          Gonioscopy       Right Left    Temporal c20-25, ss seen c20-25, ss spur seen    Nasal c20-25, ss seen c20-25, ss spur seen    Superior c20-25, ss seen c20-25, ss spur seen    Inferior c20-25, ss seen c20-25, ss spur seen          Pupils       APD    Right None    Left None          Visual Fields       Left Right     Full Full          Extraocular Movement       Right Left     Full, Ortho Full, Ortho          Neuro/Psych     Oriented x3:  Yes    Mood/Affect:  Normal             Slit Lamp and Fundus Exam     External Exam       Right Left    External Normal ocular adnexae, lacrimal gland & drainage, orbits Normal ocular adnexae, lacrimal gland & drainage, orbits          Slit Lamp Exam       Right Left    Lids/Lashes Normal structure & position Normal structure & position    Conjunctiva/Sclera Normal bulbar/palpebral, conjunctiva, sclera Normal bulbar/palpebral, conjunctiva, sclera    Cornea Normal epithelium, stroma, endothelium, tear film Normal epithelium, stroma, endothelium, tear film    Anterior Chamber Clear & deep Clear & deep    Iris Normal shape, size, morphology Normal shape, size, morphology    Lens 2+ Nuclear sclerosis 2+ Nuclear sclerosis    Vitreous Clear Clear          Fundus Exam       Right Left    Disc good rims good rims    C/D Ratio 0.3 0.3            Refraction     Wearing Rx       Sphere Cylinder Axis Add    Right +0.25 +1.00 178 +2.75    Left +0.50 +1.50 178 +2.75  Age:  61 yr    Type:  bifocal                Assessment    Visual Acuity (Snellen - Linear)       Right Left    Dist cc 20/20 20/20-2    Near cc J1+ J1+    Correction:  Glasses          Tonometry (Tonopen, 9:49 AM)       Right Left    Pressure 18 19      Tonometry #2 (Applanation, 10:30 AM)       Right Left    Pressure 14 14          Robin Green is here for    glaucoma suspect, both eyes    Referred by optical office dr. Henrene Pastor    ON cup to disk ratios OD 0.3, OS 0.3 , no blood noted on the disk 08/09/2017  CCT (corneal thickness) OD 657, OS 659  VF (visual field) NA  OCT (ocular tomography) NA    Angle 08/09/2017  OD c20-25, ss seen , OS c20-25, ss spur seen    Ophthalmology Meds    none    Assessment and Plan    No evidence of poag, could be OHT due to thick cornea OU  Observe yearly at referring OD    Follow up prn

## 2017-08-25 ENCOUNTER — Other Ambulatory Visit: Payer: Self-pay

## 2017-08-25 MED ORDER — METFORMIN HCL 500 MG PO TABS *I*
500.0000 mg | ORAL_TABLET | Freq: Two times a day (BID) | ORAL | 1 refills | Status: DC
Start: 2017-08-25 — End: 2017-09-17

## 2017-08-25 NOTE — Telephone Encounter (Signed)
Scheduled 09/17/17 & future lab orders in . Last OV : 03/17/17 for physical .

## 2017-09-09 ENCOUNTER — Other Ambulatory Visit
Admission: RE | Admit: 2017-09-09 | Discharge: 2017-09-09 | Disposition: A | Discharge status: Home / Self Care | Payer: Medicare (Managed Care) | Source: Ambulatory Visit | Attending: Primary Care | Admitting: Primary Care

## 2017-09-09 DIAGNOSIS — R7303 Prediabetes: Secondary | ICD-10-CM | POA: Insufficient documentation

## 2017-09-09 DIAGNOSIS — I10 Essential (primary) hypertension: Secondary | ICD-10-CM | POA: Insufficient documentation

## 2017-09-09 LAB — COMPREHENSIVE METABOLIC PANEL
ALT: 17 U/L (ref 0–35)
AST: 24 U/L (ref 0–35)
Albumin: 4.4 g/dL (ref 3.5–5.2)
Alk Phos: 54 U/L (ref 35–105)
Anion Gap: 10 (ref 7–16)
Bilirubin,Total: 0.5 mg/dL (ref 0.0–1.2)
CO2: 31 mmol/L — ABNORMAL HIGH (ref 20–28)
Calcium: 10.2 mg/dL (ref 8.6–10.2)
Chloride: 100 mmol/L (ref 96–108)
Creatinine: 0.94 mg/dL (ref 0.51–0.95)
GFR,Black: 75 *
GFR,Caucasian: 65 *
Glucose: 87 mg/dL (ref 60–99)
Lab: 14 mg/dL (ref 6–20)
Potassium: 4.5 mmol/L (ref 3.3–5.1)
Sodium: 141 mmol/L (ref 133–145)
Total Protein: 7.8 g/dL — ABNORMAL HIGH (ref 6.3–7.7)

## 2017-09-09 LAB — MICROALBUMIN, URINE, RANDOM
Creatinine,UR: 48 mg/dL (ref 20–300)
Microalbumin,UR: <0.3 mg/dL

## 2017-09-09 LAB — URINALYSIS REFLEX TO CULTURE
Blood,UA: NEGATIVE
Ketones, UA: NEGATIVE
Leuk Esterase,UA: NEGATIVE
Nitrite,UA: NEGATIVE
Protein,UA: NEGATIVE mg/dL
Specific Gravity,UA: 1.009 (ref 1.002–1.030)
pH,UA: 7 (ref 5.0–8.0)

## 2017-09-09 LAB — CBC
Hematocrit: 40 % (ref 34–45)
Hemoglobin: 12.9 g/dL (ref 11.2–15.7)
MCH: 29 pg/cell (ref 26–32)
MCHC: 33 g/dL (ref 32–36)
MCV: 90 fL (ref 79–95)
Platelets: 301 10*3/uL (ref 160–370)
RBC: 4.4 MIL/uL (ref 3.9–5.2)
RDW: 14.3 % (ref 11.7–14.4)
WBC: 5.7 10*3/uL (ref 4.0–10.0)

## 2017-09-09 LAB — LIPID PANEL
Chol/HDL Ratio: 2.1
Cholesterol: 203 mg/dL — AB
HDL: 99 mg/dL
LDL Calculated: 94 mg/dL
Non HDL Cholesterol: 104 mg/dL
Triglycerides: 49 mg/dL

## 2017-09-10 LAB — HEMOGLOBIN A1C: Hemoglobin A1C: 6.3 % — ABNORMAL HIGH

## 2017-09-17 ENCOUNTER — Other Ambulatory Visit: Payer: Self-pay | Admitting: Primary Care

## 2017-09-17 ENCOUNTER — Ambulatory Visit: Payer: Medicare (Managed Care) | Attending: Primary Care | Admitting: Primary Care

## 2017-09-17 ENCOUNTER — Encounter: Payer: Self-pay | Admitting: Primary Care

## 2017-09-17 VITALS — BP 125/70 | HR 78 | Wt 195.0 lb

## 2017-09-17 DIAGNOSIS — E782 Mixed hyperlipidemia: Secondary | ICD-10-CM

## 2017-09-17 DIAGNOSIS — M549 Dorsalgia, unspecified: Secondary | ICD-10-CM

## 2017-09-17 DIAGNOSIS — R7303 Prediabetes: Secondary | ICD-10-CM

## 2017-09-17 DIAGNOSIS — I10 Essential (primary) hypertension: Secondary | ICD-10-CM

## 2017-09-17 DIAGNOSIS — G4700 Insomnia, unspecified: Secondary | ICD-10-CM

## 2017-09-17 DIAGNOSIS — E669 Obesity, unspecified: Secondary | ICD-10-CM

## 2017-09-17 LAB — HM DIABETES FOOT EXAM

## 2017-09-17 MED ORDER — METFORMIN HCL 500 MG PO TABS *I*
ORAL_TABLET | ORAL | 5 refills | Status: DC
Start: 2017-09-17 — End: 2017-09-17

## 2017-09-17 NOTE — Telephone Encounter (Signed)
MM updated, next OV 12/24/17

## 2017-09-17 NOTE — Progress Notes (Signed)
VSS. Patient c/o bilat shoulder pain, and thigh pain.

## 2017-09-18 NOTE — Progress Notes (Signed)
Robin     Leafy Green is a 62 y.o. female who presents for Follow-up; Hypertension; and Shoulder Pain    Comes in for f/u of the following chronic medical problems  1. HTN- no reported headache  2. Hyperlipidemia- no CP or SOB  3. Obesity- struggling to lose weight  4. Prediabetes- no dizziness  5. Insomnia- dealing ok with that.     Has back pain and feels she is tight, wondering about seeing a chiropractor.     ROS  Cardiac symptoms: no CP or SOB  Respiratory symptoms: no cough, wheezing or SOB  Musculoskeletal symptoms: no back pain  MEDICATIONS     Current Outpatient Prescriptions   Medication Sig    acetaminophen (TYLENOL) 500 mg tablet Take 500 mg by mouth every 6 hours as needed for Pain    nystatin (MYCOSTATIN) ointment Apply topically 2 times daily as needed   30GM Use topically twice daily on affected area[s]    ALPRAZolam (XANAX) 0.25 MG tablet Take 1 tablet (0.25 mg total) by mouth 2 times daily as needed for Anxiety   Max daily dose: 0.5 mg    penicillin v potassium (VEETIDS) 500 MG tablet TAKE 4 TABLETS ONE HOUR BEFORE APPT AS DIRECTED    verapamil (CALAN-SR) 240 MG CR tablet Take 1 tablet (240 mg total) by mouth nightly   Swallow whole. Do not crush, break, or chew.    hydrochlorothiazide (MICROZIDE) 12.5 MG capsule Take 2 capsules (25 mg total) by mouth every morning    diclofenac (VOLTAREN) 1 % gel Apply 2g topically 4 times daily    amoxicillin (AMOXIL) 500 MG capsule Prior to dental procedures    POTASSIUM PO Take 595 mg by mouth daily    Glucosamine-Chondroitin (OSTEO BI-FLEX REGULAR STRENGTH) 250-200 MG TABS Take by mouth    aspirin 81 MG tablet Take 81 mg by mouth daily    cholecalciferol (VITAMIN D) 1000 UNIT tablet Take 2,000 Units by mouth daily       metFORMIN (GLUCOPHAGE) 500 MG tablet TAKE 2 TABLETS BY MOUTH TWICE DAILY    blood glucose monitor (FREESTYLE FREEDOM LITE) kit Use daily for BG testing    blood glucose test strip  Test 3-4 times a day. Freestyle Freedom. 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 H/O diabetes       Patient's medications were reviewed and updated at today's visit and no changes were made    ALLERGIES     Lisinopril      Patient's allergies were reviewed and updated at today's visit and no changes were made    PROBLEM LIST     Patient Active Problem List   Diagnosis Code    Sudden hearing loss H91.20    Obesity (BMI 30-39.9) E66.9    Type 2 diabetes mellitus E11.9    Carpal tunnel syndrome G56.00    HTN (hypertension) I10    Osteoarthritis, knee M17.10    Presence of right artificial knee joint Z96.651    Hyperlipidemia, mixed E78.2         TOBACCO USE HISTORY     History   Smoking Status    Never Smoker   Smokeless Tobacco    Never Used       PHYSICAL EXAM  BP 125/70    Pulse 78    Wt 88.5 kg (195 lb)    BMI 33.47 kg/m         Physical Exam   Constitutional: She is oriented to person, place, and time and well-developed, well-nourished, and in no distress. No distress.   Pleasant obese lady in no distress   HENT:   Head: Normocephalic and atraumatic.   Right Ear: External ear normal.   Left Ear: External ear normal.   Eyes: Right eye exhibits no discharge. Left eye exhibits no discharge. No scleral icterus.   Neck: Neck supple.   Cardiovascular: Normal rate and regular rhythm.  Exam reveals no gallop and no friction rub.    No murmur heard.  Pulmonary/Chest: Effort normal and breath sounds normal. No respiratory distress. She has no wheezes. She has no rales. She exhibits no tenderness.   Musculoskeletal:   Normal bilateral pedal pulses. Normal monofilament testing both feet    Tight trapezius muscle bilaterally   Neurological: She is alert and oriented to person, place, and time.   Skin: Skin is warm and dry. She is not diaphoretic.   Psychiatric: Mood and  affect normal.         RECENT LABS     Recent Results (from the past 336 hour(s))   CBC    Collection Time: 09/09/17 10:44 AM   Result Value Ref Range    WBC 5.7 4.0 - 10.0 THOU/uL    RBC 4.4 3.9 - 5.2 MIL/uL    Hemoglobin 12.9 11.2 - 15.7 g/dL    Hematocrit 40 34 - 45 %    MCV 90 79 - 95 fL    MCH 29 26 - 32 pg/cell    MCHC 33 32 - 36 g/dL    RDW 14.3 11.7 - 14.4 %    Platelets 301 160 - 370 THOU/uL   Comprehensive metabolic panel    Collection Time: 09/09/17 10:44 AM   Result Value Ref Range    Sodium 141 133 - 145 mmol/L    Potassium 4.5 3.3 - 5.1 mmol/L    Chloride 100 96 - 108 mmol/L    CO2 31 (H) 20 - 28 mmol/L    Anion Gap 10 7 - 16    UN 14 6 - 20 mg/dL    Creatinine 0.94 0.51 - 0.95 mg/dL    GFR,Caucasian 65 *    GFR,Black 75 *    Glucose 87 60 - 99 mg/dL    Calcium 10.2 8.6 - 10.2 mg/dL    Total Protein 7.8 (H) 6.3 - 7.7 g/dL    Albumin 4.4 3.5 - 5.2 g/dL    Bilirubin,Total 0.5 0.0 - 1.2 mg/dL    AST 24 0 - 35 U/L    ALT 17 0 - 35 U/L    Alk Phos 54 35 - 105 U/L   Lipid add Rfx to Drt LDL if Trig >400    Collection Time: 09/09/17 10:44 AM   Result Value Ref Range    Cholesterol 203 (!) mg/dL    Triglycerides 49 mg/dL    HDL 99 mg/dL    LDL Calculated 94 mg/dL    Non HDL Cholesterol 104 mg/dL    Chol/HDL Ratio 2.1    Hemoglobin A1c    Collection Time: 09/09/17 10:44 AM   Result Value Ref Range    Hemoglobin A1C 6.3 (H) %   Microalbumin, Urine, Random    Collection Time: 09/09/17 10:44 AM   Result  Value Ref Range    Creatinine,UR 48 20 - 300 mg/dL    Microalbumin,UR <0.30 mg/dL    Microalb/Creat Ratio see below 0.0 - 29.9 mg MA/g CR   Urinalysis reflex to culture    Collection Time: 09/09/17 10:44 AM   Result Value Ref Range    Color, UA Lt Yellow Yellow    Appearance,UR Clear Clear    Specific Gravity,UA 1.009 1.002 - 1.030    Leuk Esterase,UA NEG NEGATIVE    Nitrite,UA NEG NEGATIVE    pH,UA 7.0 5.0 - 8.0    Protein,UA NEG NEGATIVE mg/dL    Glucose,UA NORM mg/dL    Ketones, UA NEG NEGATIVE    Blood,UA NEG  NEGATIVE            ASSESSMENT/PLAN     1. Essential hypertension  Recommend low-salt diet.  Recommend exercise for 30 minutes most days of the week.  Maintain a healthy weight.  .  Goal blood pressure at 130/80. Continue verapamil and HCTZ- CBC; Future  - Comprehensive metabolic panel; Future  - Lipid add Rfx to Drt LDL if Trig >400; Future    2. Hyperlipidemia, mixed  Recommend low cholesterol diet.  Exercise as much as possible.  Continue to work on wt loss  Check labs as ordered.    3. Obesity (BMI 30-39.9)  Continue to work on wt loss    4. Prediabetes  Recommend low carb diet.  Continue to work on wt loss.  Exercise 30 minutes most days of the week.  Check labs as ordered.  - Hemoglobin A1c; Future  - Microalbumin, Urine, Random; Future    5. Insomnia, unspecified type  Uses xanax as needed for sleep     6. Back pain, unspecified back location, unspecified back pain laterality, unspecified chronicity  We discussed that this is muscular. Recommend that she see chiropractor or start PT as discussed. We discussed that she should only use chiropractors that she knows personally and is comfortable with as the risk of injury is high.     Return in about 3 months (around 12/16/2017) for Follow-up, diabetes.    --Patient instructed to call if symptoms are not improving or worsening  --Follow-up arranged    Signed: Earna Coder, MD on 09/18/2017 at 9:50 PM

## 2017-11-05 ENCOUNTER — Other Ambulatory Visit: Payer: Self-pay | Admitting: Primary Care

## 2017-11-05 MED ORDER — HYDROCHLOROTHIAZIDE 12.5 MG PO CAPS *I*
25.0000 mg | ORAL_CAPSULE | Freq: Every morning | ORAL | 1 refills | Status: DC
Start: 2017-11-05 — End: 2018-06-15

## 2017-11-05 NOTE — Telephone Encounter (Signed)
Next OV 4/26

## 2017-11-05 NOTE — Telephone Encounter (Signed)
MM udpated, Please erx, next ov scheduled 4/26  Last filled 7/30

## 2017-11-12 ENCOUNTER — Other Ambulatory Visit: Payer: Self-pay | Admitting: Primary Care

## 2017-11-12 MED ORDER — VERAPAMIL HCL CR 240 MG PO TBCR *I*
240.0000 mg | ORAL_TABLET | Freq: Every evening | ORAL | 1 refills | Status: DC
Start: 2017-11-12 — End: 2018-06-06

## 2017-11-12 NOTE — Telephone Encounter (Signed)
MM udpated, Please erx, next ov scheduled 4/26

## 2017-11-30 ENCOUNTER — Other Ambulatory Visit: Payer: Self-pay | Admitting: Orthopedic Surgery

## 2017-11-30 ENCOUNTER — Encounter: Payer: Self-pay | Admitting: Gastroenterology

## 2017-11-30 LAB — CBC AND DIFFERENTIAL
Baso # K/uL: 0 10*3/uL (ref 0.0–0.2)
Basophil %: 0.4 % (ref 0.0–1.8)
Eos # K/uL: 0.1 10*3/uL (ref 0.0–0.5)
Eosinophil %: 1.9 % (ref 0.0–7.0)
Hematocrit: 38 % (ref 34.0–47.0)
Hemoglobin: 12.7 g/dL (ref 11.5–16.0)
Lymph # K/uL: 2.1 10*3/uL (ref 0.9–3.8)
Lymphocyte %: 38.7 % (ref 17.0–44.0)
MCH: 28.9 PG (ref 26.0–34.0)
MCHC: 33.4 g/dL (ref 32.0–36.0)
MCV: 86.4 FL (ref 81.0–99.0)
Mono # K/uL: 0.5 10*3/uL (ref 0.2–1.0)
Monocyte %: 8.6 % (ref 4.0–12.0)
Neut # K/uL: 2.7 10*3/uL (ref 1.5–7.7)
Platelets: 294 10*3/uL (ref 140–400)
RBC: 4.4 10*6/uL (ref 3.80–5.20)
RDW: 13.6 % (ref 11.5–15.0)
Seg Neut %: 50.4 % (ref 40.0–75.0)
WBC: 5.4 10*3/uL (ref 4.0–10.8)

## 2017-11-30 LAB — UNMAPPED LAB RESULTS
Basophil # (HT): 0 10 3/uL — NL (ref 0.0–0.2)
Basophil % (HT): 0.4 % — NL (ref 0.0–1.8)
Eosinophil # (HT): 0.1 10 3/uL — NL (ref 0.0–0.5)
Eosinophil % (HT): 1.9 % — NL (ref 0.0–7.0)
Hematocrit (HT): 38 % — NL (ref 34.0–47.0)
Hemoglobin (HGB) (HT): 12.7 g/dL — NL (ref 11.5–16.0)
Lymphocyte # (HT): 2.1 10 3/uL — NL (ref 0.9–3.8)
Lymphocyte % (HT): 38.7 % — NL (ref 17.0–44.0)
MCHC (HT): 33.4 g/dL — NL (ref 32.0–36.0)
MCV (HT): 86.4 FL — NL (ref 81.0–99.0)
Mean Corpuscular Hemoglobin (MCH) (HT): 28.9 pg — NL (ref 26.0–34.0)
Monocyte # (HT): 0.5 10 3/uL — NL (ref 0.2–1.0)
Monocyte % (HT): 8.6 % — NL (ref 4.0–12.0)
Neutrophil # (HT): 2.7 10 3/uL — NL (ref 1.5–7.7)
Platelets (HT): 294 10 3/uL — NL (ref 140–400)
RBC (HT): 4.4 10 6/uL — NL (ref 3.80–5.20)
RDW (HT): 13.6 % — NL (ref 11.5–15.0)
Seg Neut % (HT): 50.4 % — NL (ref 40.0–75.0)
WBC (HT): 5.4 10 3/uL — NL (ref 4.0–10.8)

## 2017-11-30 LAB — SEDIMENTATION RATE, AUTOMATED: Sedimentation Rate: 22 mm/hour — ABNORMAL HIGH (ref 0–20)

## 2017-11-30 LAB — CRP: CRP: 0.122 (ref <0.800)

## 2017-12-16 DIAGNOSIS — M5442 Lumbago with sciatica, left side: Secondary | ICD-10-CM | POA: Insufficient documentation

## 2017-12-16 DIAGNOSIS — G8929 Other chronic pain: Secondary | ICD-10-CM | POA: Insufficient documentation

## 2017-12-18 ENCOUNTER — Other Ambulatory Visit
Admission: RE | Admit: 2017-12-18 | Discharge: 2017-12-18 | Disposition: A | Discharge status: Home / Self Care | Payer: Medicare (Managed Care) | Source: Ambulatory Visit | Attending: Primary Care | Admitting: Primary Care

## 2017-12-18 DIAGNOSIS — I10 Essential (primary) hypertension: Secondary | ICD-10-CM | POA: Insufficient documentation

## 2017-12-18 DIAGNOSIS — R7303 Prediabetes: Secondary | ICD-10-CM | POA: Insufficient documentation

## 2017-12-18 LAB — CBC
Hematocrit: 41 % (ref 34–45)
Hemoglobin: 13.7 g/dL (ref 11.2–15.7)
MCH: 30 pg/cell (ref 26–32)
MCHC: 34 g/dL (ref 32–36)
MCV: 90 fL (ref 79–95)
Platelets: 282 10*3/uL (ref 160–370)
RBC: 4.5 MIL/uL (ref 3.9–5.2)
RDW: 14.7 % — ABNORMAL HIGH (ref 11.7–14.4)
WBC: 5.2 10*3/uL (ref 4.0–10.0)

## 2017-12-18 LAB — LIPID PANEL
Chol/HDL Ratio: 2.3
Cholesterol: 228 mg/dL — AB
HDL: 98 mg/dL
LDL Calculated: 118 mg/dL
Non HDL Cholesterol: 130 mg/dL
Triglycerides: 59 mg/dL

## 2017-12-18 LAB — COMPREHENSIVE METABOLIC PANEL
ALT: 19 U/L (ref 0–35)
AST: 21 U/L (ref 0–35)
Albumin: 4.4 g/dL (ref 3.5–5.2)
Alk Phos: 55 U/L (ref 35–105)
Anion Gap: 11 (ref 7–16)
Bilirubin,Total: 0.4 mg/dL (ref 0.0–1.2)
CO2: 30 mmol/L — ABNORMAL HIGH (ref 20–28)
Calcium: 10 mg/dL (ref 8.6–10.2)
Chloride: 100 mmol/L (ref 96–108)
Creatinine: 0.9 mg/dL (ref 0.51–0.95)
GFR,Black: 79 *
GFR,Caucasian: 69 *
Glucose: 107 mg/dL — ABNORMAL HIGH (ref 60–99)
Lab: 19 mg/dL (ref 6–20)
Potassium: 4.6 mmol/L (ref 3.3–5.1)
Sodium: 141 mmol/L (ref 133–145)
Total Protein: 7.5 g/dL (ref 6.3–7.7)

## 2017-12-20 LAB — MICROALBUMIN, URINE, RANDOM
Creatinine,UR: 63 mg/dL (ref 20–300)
Microalbumin,UR: <1.2 mg/dL

## 2017-12-20 LAB — HEMOGLOBIN A1C: Hemoglobin A1C: 6.1 % — ABNORMAL HIGH

## 2017-12-24 ENCOUNTER — Encounter: Payer: Self-pay | Admitting: Primary Care

## 2017-12-24 ENCOUNTER — Ambulatory Visit: Payer: Medicare (Managed Care) | Attending: Primary Care | Admitting: Primary Care

## 2017-12-24 VITALS — BP 136/88 | HR 85 | Ht 64.0 in | Wt 194.0 lb

## 2017-12-24 DIAGNOSIS — H9122 Sudden idiopathic hearing loss, left ear: Secondary | ICD-10-CM

## 2017-12-24 DIAGNOSIS — R7303 Prediabetes: Secondary | ICD-10-CM

## 2017-12-24 DIAGNOSIS — M542 Cervicalgia: Secondary | ICD-10-CM

## 2017-12-24 DIAGNOSIS — G8929 Other chronic pain: Secondary | ICD-10-CM

## 2017-12-24 DIAGNOSIS — E669 Obesity, unspecified: Secondary | ICD-10-CM

## 2017-12-24 DIAGNOSIS — M549 Dorsalgia, unspecified: Secondary | ICD-10-CM

## 2017-12-24 DIAGNOSIS — I10 Essential (primary) hypertension: Secondary | ICD-10-CM

## 2017-12-24 DIAGNOSIS — E782 Mixed hyperlipidemia: Secondary | ICD-10-CM

## 2017-12-24 DIAGNOSIS — G4700 Insomnia, unspecified: Secondary | ICD-10-CM

## 2017-12-24 NOTE — Progress Notes (Signed)
Robin     Layza Green is a 62 y.o. female who presents for Diabetes and Lab Results  Patient comes in for follow-up of the following chronic medical problems  Prediabetes-no reported chest pain  Hypertension-no headache  Obesity-continues to struggle to lose weight  Insomnia-at baseline  Hearing loss-sees ENT  Chronic neck and back pain-has been seeing or go for that is unhappy with the physical therapist and is requesting a new referral for physical therapy        ROS  Constitutional symptoms-positive for weight gain  Cardiac symptoms-no chest pain or palpitations  Respiratory symptoms-no cough wheezing or shortness of breath  Musculoskeletal symptoms-positive for neck and back pain  MEDICATIONS     Current Outpatient Prescriptions   Medication Sig    verapamil (CALAN-SR) 240 MG CR tablet Take 1 tablet (240 mg total) by mouth nightly   Swallow whole. Do not crush, break, or chew.    hydrochlorothiazide (MICROZIDE) 12.5 MG capsule Take 2 capsules (25 mg total) by mouth every morning    metFORMIN (GLUCOPHAGE) 500 MG tablet TAKE 2 TABLETS BY MOUTH TWICE DAILY    acetaminophen (TYLENOL) 500 mg tablet Take 500 mg by mouth every 6 hours as needed for Pain    nystatin (MYCOSTATIN) ointment Apply topically 2 times daily as needed   30GM Use topically twice daily on affected area[s]    ALPRAZolam (XANAX) 0.25 MG tablet Take 1 tablet (0.25 mg total) by mouth 2 times daily as needed for Anxiety   Max daily dose: 0.5 mg    penicillin v potassium (VEETIDS) 500 MG tablet TAKE 4 TABLETS ONE HOUR BEFORE APPT AS DIRECTED    diclofenac (VOLTAREN) 1 % gel Apply 2g topically 4 times daily    amoxicillin (AMOXIL) 500 MG capsule Prior to dental procedures    blood glucose monitor (FREESTYLE FREEDOM LITE) kit Use daily for BG testing    blood glucose test strip Test 3-4 times a day. Freestyle Freedom. 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 H/O diabetes    Glucosamine-Chondroitin (OSTEO BI-FLEX REGULAR STRENGTH) 250-200 MG TABS Take by mouth    aspirin 81 MG tablet Take 81 mg by mouth daily    cholecalciferol (VITAMIN D) 1000 UNIT tablet Take 2,000 Units by mouth daily          Patient's medications were reviewed and updated at today's visit and no changes were made    ALLERGIES     Lisinopril      Patient's allergies were reviewed and updated at today's visit and no changes were made    PROBLEM LIST     Patient Active Problem List   Diagnosis Code    Sudden hearing loss H91.20    Obesity (BMI 30-39.9) E66.9    Type 2 diabetes mellitus E11.9    Carpal tunnel syndrome G56.00    HTN (hypertension) I10    Osteoarthritis, knee M17.10    Presence of right artificial knee joint Z96.651    Hyperlipidemia, mixed E78.2    Chronic left-sided low back pain with left-sided sciatica M54.42, G89.29         TOBACCO USE HISTORY     History   Smoking Status  Never Smoker   Smokeless Tobacco    Never Used       PHYSICAL EXAM     BP 136/88 (BP Location: Right arm, Patient Position: Sitting, Cuff Size: large adult)    Pulse 85    Ht 1.626 m ('5\' 4"' )    Wt 88 kg (194 lb)    SpO2 98%    BMI 33.30 kg/m         Physical Exam   Constitutional: She is well-developed, well-nourished, and in no distress.   HENT:   Head: Normocephalic and atraumatic.   Right Ear: External ear normal.   Left Ear: External ear normal.   Nose: Nose normal.   Eyes: Right eye exhibits no discharge. Left eye exhibits no discharge.   Cardiovascular: Normal rate, regular rhythm and normal heart sounds.    No murmur heard.  Pulmonary/Chest: Effort normal and breath sounds normal. No respiratory distress. She has no wheezes.   Musculoskeletal:   Tender trapezius muscles bilaterally moderate tenderness over the cervical spine and adjoining  area.  Patient also reports pain in the left thigh         RECENT LABS     Recent Results (from the past 336 hour(s))   CBC    Collection Time: 12/18/17 11:51 AM   Result Value Ref Range    WBC 5.2 4.0 - 10.0 THOU/uL    RBC 4.5 3.9 - 5.2 MIL/uL    Hemoglobin 13.7 11.2 - 15.7 g/dL    Hematocrit 41 34 - 45 %    MCV 90 79 - 95 fL    MCH 30 26 - 32 pg/cell    MCHC 34 32 - 36 g/dL    RDW 14.7 (H) 11.7 - 14.4 %    Platelets 282 160 - 370 THOU/uL   Comprehensive metabolic panel    Collection Time: 12/18/17 11:51 AM   Result Value Ref Range    Sodium 141 133 - 145 mmol/L    Potassium 4.6 3.3 - 5.1 mmol/L    Chloride 100 96 - 108 mmol/L    CO2 30 (H) 20 - 28 mmol/L    Anion Gap 11 7 - 16    UN 19 6 - 20 mg/dL    Creatinine 0.90 0.51 - 0.95 mg/dL    GFR,Caucasian 69 *    GFR,Black 79 *    Glucose 107 (H) 60 - 99 mg/dL    Calcium 10.0 8.6 - 10.2 mg/dL    Total Protein 7.5 6.3 - 7.7 g/dL    Albumin 4.4 3.5 - 5.2 g/dL    Bilirubin,Total 0.4 0.0 - 1.2 mg/dL    AST 21 0 - 35 U/L    ALT 19 0 - 35 U/L    Alk Phos 55 35 - 105 U/L   Hemoglobin A1c    Collection Time: 12/18/17 11:51 AM   Result Value Ref Range    Hemoglobin A1C 6.1 (H) %   Microalbumin, Urine, Random    Collection Time: 12/18/17 11:51 AM   Result Value Ref Range    Creatinine,UR 63 20 - 300 mg/dL    Microalbumin,UR <1.20 mg/dL    Microalb/Creat Ratio see below 0.0 - 29.9 mg MA/g CR   Lipid add Rfx to Drt LDL if Trig >400    Collection Time: 12/18/17 11:51 AM   Result Value Ref Range    Cholesterol 228 (!) mg/dL    Triglycerides 59 mg/dL    HDL 98 mg/dL  LDL Calculated 118 mg/dL    Non HDL Cholesterol 130 mg/dL    Chol/HDL Ratio 2.3             ASSESSMENT/PLAN     1. Prediabetes  Continue low carb diet.  Continue to work on weight loss.  Continue metformin at current dosage.  2 labs as ordered.  - Hemoglobin A1c; Future  - Microalbumin, Urine, Random; Future    2. Essential hypertension  Continue to follow a low-salt diet.  Continue verapamil at current dosage as well  as hydrochlorothiazide at current dosage.  Exercise 30 minutes most days of the week.  Work on weight loss.  Reassess at next office visit.  Goal blood pressure 130/80.  - Comprehensive metabolic panel; Future  - Lipid add Rfx to Drt LDL if Trig >400; Future  - CBC; Future  - Urinalysis with reflex to culture; Future    3. Hyperlipidemia, mixed  Follow low cholesterol diet.  Exercise 30 minutes most days of the week.  Work on weight loss.  4. Obesity (BMI 30-39.9)  Continue to work on weight loss    5. Insomnia, unspecified type  Supportive care    6. Sudden left hearing loss  Managed by ENT.    7. Back pain, unspecified back location, unspecified back pain laterality, unspecified chronicity  Supportive care  8. Chronic neck and back pain  Will review notes from orthopedics when available.  See referral to physical therapy per patient request.  - AMB REFERRAL TO PHYSICAL / OCCUPATIONAL THERAPY    Return in about 4 months (around 04/25/2018).    --Patient instructed to call if symptoms are not improving or worsening  --Follow-up arranged    Signed: Earna Coder, MD on 12/26/2017 at 1:10 AM

## 2017-12-29 ENCOUNTER — Ambulatory Visit: Payer: Medicare (Managed Care) | Attending: Primary Care | Admitting: Rehabilitative and Restorative Service Providers"

## 2017-12-29 DIAGNOSIS — M47812 Spondylosis without myelopathy or radiculopathy, cervical region: Secondary | ICD-10-CM | POA: Insufficient documentation

## 2017-12-29 DIAGNOSIS — M5416 Radiculopathy, lumbar region: Secondary | ICD-10-CM

## 2017-12-29 NOTE — Progress Notes (Addendum)
Sent Via: eRecord EMR    Physician attestation: Physician:  Dr. Rennis Harding  Per signature, I have reviewed and agree with the documented goals and Plan of Care    _____________________________________________________________________           [facility] SPINE THERAPY  LUMBAR SPINE EVALUATION      Diagnosis: Cervicalgia, L sided lumbar radiculopathy  Onset date: Chronic 2 months prior to initial evaluation  Date of surgery: NA    SUBJECTIVE    Robin Green is a 62 y.o. female who is present today for back, left hip, cervical  care.  Mechanism of injury/history of symptoms: No specific cause     Pt reports both neck and hip/leg pain started about 2 months ago, and the pain has been constant since then. She reports left sided neck pain, and left thigh and dorsum of foot pain all day everyday. Reports she has done PT in the past but needed to stop for insurance reasons. Denies changes in pain with motion, and states the pain makes it hard to sleep at night. However, does reports that being up and moving or sitting too long increases pain    and heat helps. Reports having carpal tunnel syndrome in both hands.       Nature of Symptoms  Relevant symptoms: Aching, Throbbing, Pain    Symptom frequency: Intermittent  Symptom intensity (0 - 10 scale): Now 8 Best 5 Worst 10  Symptom location:Left upper trapezius, left anterior thigh, left dorsum of foot  Symptoms worsen with: Active movements , prolonged sitting  Symptoms improve with: Rest , heat  Prior history of left hip pain.  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, Stage manager and Gardening/Yard Work   Sport/Activities: NONE    Patients goals for therapy: Perform all self care ADLs (bathing, hygiene, dressing, eating) independently, Reduce pain, Increase strength, Achieve independence with home program for self care / condition management, Return to sports / activities    Observation: increased thoracic kyphosis; forward head posture    Lumbar screen:  Flexion: Moderate limitation, pain  Extension: Moderate limitation, pain  Side bend: Mild limitation bilaterally, pain with left side bend  Rotation: Pain with left rotation, mild ROM limitation bilaterally      ROM/Strength      HIP LEFT RIGHT STRENGTH    PROM AROM PROM AROM Left Right   Flexion         Extension     4/5 4/5   Abduction     3+/5 3+/5   IR  WNL  WNL 4+/5 4+/5   ER  WNL  WNL 4+/5 4+/5   Adduction                        KNEE LEFT RIGHT STRENGTH    PROM AROM PROM AROM Left Right   Flexion     5/5 5/5   Extension     5/5 5/5  ANKLE LEFT RIGHT STRENGTH    PROM AROM PROM AROM Left Right   DF (knee ext)     5/5 5/5   DF (knee flex)         PF         INV         EV                  CKC Ankle DF Knee to Wall (cm)           Palpation: No pain to palpation L hip, pain to palpation L upper trapezius    Neurologic:  Sensation:  UE, LE  Light touch, Intact to gross screen  Myotomes:  UE, LE Normal and Intact to gross screen      Cervical ROM Loss  Flexion: moderate loss  Extension:  moderate loss  Left Sidebend: moderate loss  Right Sidebend:  moderate loss  Left Rotation:  moderate loss  Right Rotation:  moderate loss    Repeated Movement Testing  Sitting Correction: symptoms reduced:  no  NA  Directional Preference:  Neutral    Cervical Special Tests  Spurlings compression (negative; positive for local pain                                                          LE Flexibility  Quadricep:  Poor  Hamstring:  Good      Special Tests:   Hip FADIR,  positive  FABER,  positive  Slump,   positive  Straight Leg Raise,  positive   Knee NA   Ankle NA          Functional:  Stand more than 15 minutes, - unable to perform.  Sit more than 15 minutes - unable to perform.  Walk more than 15 minutes - unable to perform.      Assessment:    Findings consistent with 62 y.o. female with left sided cervicalgia and lumbar radiculopathy with pain, ROM limitations, strength limitations, functional limitations.    Personal factors affecting treatment/recovery:   Advanced age   Previous noncompliance with PT  Comorbidities affecting treatment/recovery:    Sudden hearing loss    Obesity (BMI 30-39.9)    Type 2 diabetes mellitus    Carpal tunnel syndrome    HTN (hypertension)    Osteoarthritis, knee    Presence of right artificial knee joint    Hyperlipidemia, mixed    Chronic left-sided low back pain with left-sided sciatica  Clinical presentation:   evolving  Patient complexity:     moderate level as indicated by above stability of condition, personal factors, environmental factors and comorbidities in addition to patient symptom presentation and impairments found on physical exam.    Prognosis:  Fair   Contraindications/Precautions/Limitation:  Per diagnosis  Short Term Goals: (1 month(s)): Decrease c/o max pain to < 4/10, Decrease pain to 4/10 , Increase ROM full lumbar AROM, Increase strength by 1/2 grade hip abduction and extension and Minimal assistance with HEP/ education concepts; be able to sleep through the night without pain consistently  Long Term Goals: (3 month(s)): ROM/ flexibility WNL , Restoration of functional strength, Independent with HEP/education , Functional return to ADLs / activities without limitations ; return to daily activities without symptom exacerbation >3/10    Treatment Plan:  Options / plan reviewed with patient:  Yes  Freq 2 times per week for 3 month(s)    Treatment plan inclusive of:   Exercise: AROM, AAROM, PROM, Stretching, Strengthening, Progressive  Resistive   Manual Techniques:  Myofascial Release, Joint mobilization, Soft tissue release/massage   Modalities:  Cold pack, Cryotherapy, Functional/Therapeutic activites per flowsheet, Moist heat, Neuromuscular Re-ed,  Activity TA/glute activation, deep neck flexor, Ther Exercise per flowsheet   Functional: Proprioception/Dynamic stability, Postural training, Balance     Thank you for referring this patient to [facility].    Terance Ice, PT,DPT          TIME + FUNCTIONAL REPORTING    Treatment Start Time 2:10   Treatment End Time 2:50   Total Minutes of treatment 40       Total Non-Treatment time(rest, etc) 0   Total Service Based Min. of Treatment 30   Total Time-Based minutes of treatment 10           Service-Based Procedures/ Modalities    Evaluation 30   Re-evaluation    Traction, mechanical    Electric stimulation (unattended)    Whirlpool    Vasopneumatic device    Total service-based billable procedures 1       Time-Based  Procedures / Modalities    Electric stimulation (manual)    Iontophoresis    Contrast baths    Ultrasound    Physical Performance Test    Therapeutic ex 10   Neuromuscular reed    Gait training, including stairs    Manual Therapy    Therapeutic Activities    Total Time-Based Billable Procedures 1              POC DATE: 12/29/17         Thomas test stretch x30 seconds   Bridge x10                   UT stretch L only x30 seconds   Scapular retraction x10

## 2018-01-04 ENCOUNTER — Ambulatory Visit: Payer: Medicare (Managed Care) | Admitting: Rehabilitative and Restorative Service Providers"

## 2018-01-04 DIAGNOSIS — M5416 Radiculopathy, lumbar region: Secondary | ICD-10-CM

## 2018-01-04 DIAGNOSIS — M47812 Spondylosis without myelopathy or radiculopathy, cervical region: Secondary | ICD-10-CM

## 2018-01-05 NOTE — Progress Notes (Signed)
Orthopedic Sports/Spine  PT Note    Sreshta Cressler   [id number]     Diagnosis: Cervicalgia, L sided lumbar radiculopathy    Subjective:  Pain Score:  6  Pain: Unchanged     Pt reports she has been in a lot of pain the last few days, states she has difficulty finding any relief with symptoms and they do not seem to be related to activity changes. She reports frequent cramping in her legs.     Objective:  ROM - Unchanged L neck and hip/knee  Strength - Therapeutic Exercises per flowsheet  Function: - Unchanged  Education:  Updated HEP, Verbal cues for ther ex, Manual cues for ther ex    Objective         Thomas test stretch 3x30 seconds   Bridge 3x10   Side lying abduction x30 B   P/A pelvic tilting x30   Seated pelvic tilting x30       UT stretch L only 3x30 seconds   Scapular retraction 2x10   Moist heat  10 minutes   Supine retraction 10x10"       Manual cervical STM, side glide, trigger point release, traction 15 minutes         Treatment:  Manual therapy,  Joint Mobilization, Soft tissue Mobilization / Massage, Ther Exercise per flowsheet    Assessment:  Session focused on hip stretching and stability, as well as gentle cervical stretching and manual techniques. Pt had frequent hip cramping/pain with exercises, which resolved with rest breaks. She also had a positive response for resolution of symptoms with cervical traction, so may consider mechanical traction to further improve symptoms.        Plan of Care:  Continue per Plan of care -  As written; Patient would benefit from skilled rehabilitation services to address the above impairments to restore functional capacity.    Thank you for referring this patient to [facility]    Terance Ice, PT,DPT       TIME + FUNCTIONAL REPORTING    Treatment Start Time 3:30   Treatment End Time 4:35   Total Minutes of treatment 60       Total Non-Treatment time(rest, etc) 5   Total Service Based Min. of Treatment 0   Total Time-Based minutes of  treatment 60           Service-Based Procedures/ Modalities    Evaluation    Re-evaluation    Traction, mechanical    Electric stimulation (unattended)    Whirlpool    Vasopneumatic device    Total service-based billable procedures        Time-Based  Procedures / Modalities    Electric stimulation (manual)    Iontophoresis    Contrast baths    Ultrasound    Physical Performance Test    Therapeutic ex 45   Neuromuscular reed    Gait training, including stairs    Manual Therapy 15   Therapeutic Activities    Total Time-Based Billable Procedures 4              POC DATE: 12/29/17

## 2018-01-06 ENCOUNTER — Ambulatory Visit: Payer: Medicare (Managed Care) | Admitting: Rehabilitative and Restorative Service Providers"

## 2018-01-06 DIAGNOSIS — M47812 Spondylosis without myelopathy or radiculopathy, cervical region: Secondary | ICD-10-CM

## 2018-01-06 DIAGNOSIS — M5416 Radiculopathy, lumbar region: Secondary | ICD-10-CM

## 2018-01-09 NOTE — Progress Notes (Signed)
Orthopedic Sports/Spine  PT Note    Rayma Hegg   [id number]     Diagnosis: Cervicalgia, L sided lumbar radiculopathy    Subjective:  Pain Score:  5  Pain: Unchanged     Pt reports her neck felt good after last session which she attributes to manual techniques and heat.    Objective:  ROM - Unchanged L neck and hip/knee  Strength - Therapeutic Exercises per flowsheet  Function: - Unchanged  Education:  Updated HEP, Verbal cues for ther ex, Manual cues for ther ex    Objective         Thomas test stretch 3x30 seconds   Bridge 3x10   Side lying abduction x30 B   P/A pelvic tilting x30   Seated pelvic tilting        UT stretch L only    Scapular retraction    Moist heat  10 minutes   Supine retraction        Mechanical traction 15 minutes   Prone quad stretch 3x30 seconds   Gentle knee stretch 3x30 seconds       Manual cervical STM, side glide, trigger point release, traction          Treatment:  Cervical Traction -- 20 lbs,  30 sec. hold/  15 ec. relax TIME, 15 min TOTAL TIME, Ther Exercise per flowsheet    Assessment:  Session focused on hip stretching and stability, in addition to cervical traction to help with cervical symptoms as pt had a very positive response to manual traction. She continues to respond well to heat for symptom reduction as well.        Plan of Care:  Continue per Plan of care -  As written; Patient would benefit from skilled rehabilitation services to address the above impairments to restore functional capacity.    Thank you for referring this patient to [facility]    Terance Ice, PT,DPT       TIME + FUNCTIONAL REPORTING    Treatment Start Time 1:35   Treatment End Time 2:15   Total Minutes of treatment 40       Total Non-Treatment time(rest, etc) 0   Total Service Based Min. of Treatment 0   Total Time-Based minutes of treatment 40           Service-Based Procedures/ Modalities    Evaluation    Re-evaluation    Traction, mechanical 15   Electric stimulation  (unattended)    Whirlpool    Vasopneumatic device    Total service-based billable procedures 1       Time-Based  Procedures / Modalities    Electric stimulation (manual)    Iontophoresis    Contrast baths    Ultrasound    Physical Performance Test    Therapeutic ex 25   Neuromuscular reed    Gait training, including stairs    Manual Therapy    Therapeutic Activities    Total Time-Based Billable Procedures 2              POC DATE: 12/29/17

## 2018-01-11 ENCOUNTER — Ambulatory Visit: Payer: Medicare (Managed Care) | Admitting: Rehabilitative and Restorative Service Providers"

## 2018-01-14 ENCOUNTER — Ambulatory Visit: Payer: Medicare (Managed Care) | Admitting: Rehabilitative and Restorative Service Providers"

## 2018-01-14 DIAGNOSIS — M5416 Radiculopathy, lumbar region: Secondary | ICD-10-CM

## 2018-01-14 DIAGNOSIS — M47812 Spondylosis without myelopathy or radiculopathy, cervical region: Secondary | ICD-10-CM

## 2018-01-17 NOTE — Progress Notes (Signed)
Orthopedic Sports/Spine  PT Note    Robin Green   [id number]     Diagnosis: Cervicalgia, L sided lumbar radiculopathy    Subjective:  Pain Score:  5  Pain: Unchanged     Pt reports she had a high level of pain earlier in the week but is feeling better today since waking up.     Objective:  ROM - Unchanged L neck and hip/knee  Strength - Therapeutic Exercises per flowsheet  Function: - Unchanged  Education:  Updated HEP, Verbal cues for ther ex, Manual cues for ther ex    SLR: possible positive  Slump: positive  FABER: negative  FADIR: negative  Femoral nerve tension test: negative      Objective         Thomas test stretch 3x30 seconds   Bridge 3x10   Side lying abduction x30 B   P/A pelvic tilting    Seated pelvic tilting        UT stretch L only    Scapular retraction    Moist heat  10 minutes   Supine retraction 15x10 seconds   Objective measures 10 minutes   Manual traction    Prone quad stretch 3x30 seconds   Gentle knee stretch        Manual cervical STM, side glide, trigger point release, traction 10 minutes         Treatment:  Manual therapy,  Joint Mobilization, Soft tissue Mobilization / Massage, Ther Exercise per flowsheet    Assessment:  Session focused on hip stretching and stability, in addition to manual techniques and deep neck flexor strengthening for cervical symptoms. Pt had negative testing for hip special testing today, but positive for lumbar, indicating likely cause of symptoms from lumbar region. She continues to have a positive response to heat for symptom relief.        Plan of Care:  Continue per Plan of care -  As written; Patient would benefit from skilled rehabilitation services to address the above impairments to restore functional capacity.    Thank you for referring this patient to [facility]    Terance Ice, PT,DPT       TIME + FUNCTIONAL REPORTING    Treatment Start Time 11:00   Treatment End Time 12:00   Total Minutes of treatment 60       Total  Non-Treatment time(rest, etc) 5   Total Service Based Min. of Treatment 0   Total Time-Based minutes of treatment 55           Service-Based Procedures/ Modalities    Evaluation    Re-evaluation    Traction, mechanical    Electric stimulation (unattended)    Whirlpool    Vasopneumatic device    Total service-based billable procedures        Time-Based  Procedures / Modalities    Electric stimulation (manual)    Iontophoresis    Contrast baths    Ultrasound    Physical Performance Test    Therapeutic ex 40   Neuromuscular reed    Gait training, including stairs    Manual Therapy 15   Therapeutic Activities    Total Time-Based Billable Procedures 4              POC DATE: 12/29/17

## 2018-01-18 ENCOUNTER — Ambulatory Visit: Payer: Medicare (Managed Care) | Admitting: Rehabilitative and Restorative Service Providers"

## 2018-01-18 DIAGNOSIS — M47812 Spondylosis without myelopathy or radiculopathy, cervical region: Secondary | ICD-10-CM

## 2018-01-18 DIAGNOSIS — M5416 Radiculopathy, lumbar region: Secondary | ICD-10-CM

## 2018-01-18 NOTE — Progress Notes (Signed)
Orthopedic Sports/Spine  PT Note    Robin Green   [id number]     Diagnosis: Cervicalgia, L sided lumbar radiculopathy    Subjective:  Pain Score:  5  Pain: Unchanged     Pt reports overall pain is much improved from earlier in the week. States both neck and leg pain are equal right now. She is attempting to use heat at home for pain control.    Objective:  ROM - Unchanged L neck and hip/knee  Strength - Therapeutic Exercises per flowsheet  Function: - Unchanged  Education:  Updated HEP, Verbal cues for ther ex, Manual cues for ther ex      Objective       Thomas test stretch    Bridge 3x10   Side lying abduction    P/A pelvic tilting 2x10   Seated pelvic tilting    bike 5 minutes   UT stretch L only    Scapular retraction    Moist heat  10 minutes   Supine retraction 15x10 seconds   Objective measures    Manual traction    Prone quad stretch 3x30 seconds   Gentle knee stretch    Prone hamstring curl x30 B   Ultrasound  5 minutes   Manual cervical STM, side glide, trigger point release, traction 10 minutes         Treatment:  Manual therapy,  Joint Mobilization, Soft tissue Mobilization / Massage, Ther Exercise per flowsheet, Ultrasound     Assessment:  Session focused on hip stretching and stability, in addition to manual techniques and deep neck flexor strengthening for cervical symptoms. Hip flexor ROM and quadricep flexibility are both limited still, and pt continues to have intermittent muscle cramping throughout session. Pt continues to respond well to modalities (ultrasound and heat) for pain control in cervical region.        Plan of Care:  Continue per Plan of care -  As written; Patient would benefit from skilled rehabilitation services to address the above impairments to restore functional capacity.    Thank you for referring this patient to [facility]    Terance Ice, PT,DPT       TIME + FUNCTIONAL REPORTING    Treatment Start Time 1:45   Treatment End Time 2:55   Total  Minutes of treatment 70       Total Non-Treatment time(rest, etc) 10 (heat)   Total Service Based Min. of Treatment 0   Total Time-Based minutes of treatment 60           Service-Based Procedures/ Modalities    Evaluation    Re-evaluation    Traction, mechanical    Electric stimulation (unattended)    Whirlpool    Vasopneumatic device    Total service-based billable procedures        Time-Based  Procedures / Modalities    Electric stimulation (manual)    Iontophoresis    Contrast baths    Ultrasound 5   Physical Performance Test    Therapeutic ex 40   Neuromuscular reed    Gait training, including stairs    Manual Therapy 15   Therapeutic Activities    Total Time-Based Billable Procedures 4              POC DATE: 12/29/17

## 2018-01-21 ENCOUNTER — Ambulatory Visit: Payer: Medicare (Managed Care) | Admitting: Rehabilitative and Restorative Service Providers"

## 2018-01-21 DIAGNOSIS — M5416 Radiculopathy, lumbar region: Secondary | ICD-10-CM

## 2018-01-21 DIAGNOSIS — M47812 Spondylosis without myelopathy or radiculopathy, cervical region: Secondary | ICD-10-CM

## 2018-01-27 NOTE — Progress Notes (Signed)
Orthopedic Sports/Spine  PT Note    Han Lysne   [id number]     Diagnosis: Cervicalgia, L sided lumbar radiculopathy    Subjective:  Pain Score:  3  Pain: Unchanged     Pt reports pain continues to be improved overall. States she noticed no improvement with ultrasound, but heat always helps with pain relief.     Objective:  ROM - Unchanged L neck and hip/knee  Strength - Therapeutic Exercises per flowsheet  Function: - Unchanged  Education:  Updated HEP, Verbal cues for ther ex, Manual cues for ther ex      Objective       Thomas test stretch    Bridge 3x10   Side lying abduction    P/A pelvic tilting 2x10   Seated pelvic tilting    bike 5 minutes   UT stretch L only    Scapular retraction    Moist heat  10 minutes   Supine retraction 15x10 seconds   Objective measures    Manual traction    Prone quad stretch 3x30 seconds   Gentle knee stretch    Prone hamstring curl x30 B   Ultrasound     Straight arm rows 3x10, orange   Manual cervical STM, side glide, trigger point release, traction          Treatment:  Ther Exercise per flowsheet    Assessment:  Session focused on hip stretching and stability and stability/strenghening exercises for deep neck flexors/peri scapular musculature. Periscapular weakness evident today is likely contributing to continued upper trapezius pain. She continues to have intermittent muscular cramping in anterior thigh with hip extension/abduction exercises, which improves with rest.         Plan of Care:  Continue per Plan of care -  As written; Patient would benefit from skilled rehabilitation services to address the above impairments to restore functional capacity.    Thank you for referring this patient to [facility]    Terance Ice, PT,DPT       TIME + FUNCTIONAL REPORTING    Treatment Start Time 11:00   Treatment End Time 12:00   Total Minutes of treatment 50       Total Non-Treatment time(rest, etc) 10 (heat)   Total Service Based Min. of Treatment 0   Total  Time-Based minutes of treatment 50           Service-Based Procedures/ Modalities    Evaluation    Re-evaluation    Traction, mechanical    Electric stimulation (unattended)    Whirlpool    Vasopneumatic device    Total service-based billable procedures        Time-Based  Procedures / Modalities    Electric stimulation (manual)    Iontophoresis    Contrast baths    Ultrasound    Physical Performance Test    Therapeutic ex 50   Neuromuscular reed    Gait training, including stairs    Manual Therapy    Therapeutic Activities    Total Time-Based Billable Procedures 3              POC DATE: 12/29/17

## 2018-01-28 ENCOUNTER — Ambulatory Visit: Payer: Medicare (Managed Care) | Admitting: Rehabilitative and Restorative Service Providers"

## 2018-02-01 ENCOUNTER — Encounter: Payer: Self-pay | Admitting: Gastroenterology

## 2018-02-04 ENCOUNTER — Ambulatory Visit: Payer: Medicare (Managed Care) | Admitting: Rehabilitative and Restorative Service Providers"

## 2018-02-10 ENCOUNTER — Ambulatory Visit: Payer: Medicare (Managed Care) | Admitting: Rehabilitative and Restorative Service Providers"

## 2018-02-11 ENCOUNTER — Telehealth: Payer: Self-pay | Admitting: Primary Care

## 2018-02-11 ENCOUNTER — Ambulatory Visit: Payer: Medicare (Managed Care) | Admitting: Rehabilitative and Restorative Service Providers"

## 2018-02-11 NOTE — Telephone Encounter (Signed)
Patient received an MRI req from her [facility] Orinda KennerMichael Klotz. She wants to go to [facility] location to have it performed. [facility], [phone].

## 2018-02-14 ENCOUNTER — Other Ambulatory Visit: Payer: Self-pay | Admitting: Orthopedic Surgery

## 2018-02-14 DIAGNOSIS — M5440 Lumbago with sciatica, unspecified side: Secondary | ICD-10-CM

## 2018-02-18 ENCOUNTER — Ambulatory Visit: Payer: Medicare (Managed Care) | Attending: Primary Care | Admitting: Primary Care

## 2018-02-18 ENCOUNTER — Encounter: Payer: Self-pay | Admitting: Primary Care

## 2018-02-18 ENCOUNTER — Telehealth: Payer: Self-pay | Admitting: Primary Care

## 2018-02-18 VITALS — BP 124/64 | HR 70 | Ht 64.0 in | Wt 194.0 lb

## 2018-02-18 DIAGNOSIS — G8929 Other chronic pain: Secondary | ICD-10-CM

## 2018-02-18 DIAGNOSIS — M542 Cervicalgia: Secondary | ICD-10-CM

## 2018-02-18 DIAGNOSIS — M549 Dorsalgia, unspecified: Secondary | ICD-10-CM

## 2018-02-18 MED ORDER — GABAPENTIN 300 MG PO CAPSULE *I*
300.0000 mg | ORAL_CAPSULE | Freq: Every evening | ORAL | 1 refills | Status: DC
Start: 2018-02-18 — End: 2018-06-27

## 2018-02-18 NOTE — Progress Notes (Signed)
Follow up for lower back pain with increase Going to have a MRI in a few weeks

## 2018-02-18 NOTE — Progress Notes (Signed)
Robin Green     Robin Green is a 62 y.o. female who presents for Back Pain    Comes in for f/u of neck and back pain. Patient is seeing orthopedics for f/u of neck and back pain.   Pt has not followed up with PT due to worsening pain.  Xray left hip was wnl. 30m nonagressive appearing lytic lesion of the right pubis of undetermined etiology. Degenerative cyst is a consideration but lesion is stable and benign per radiology note.  Xray LS spine shows mild deg changes, most pronounced at L4-L5 and L5-S1 with grade 1 anterolisthesis of L4 on L5  MRI spine is pending.. Patient continues with pain down rt leg and she cannot even sleep at night due to the pain.     ROS  Systemic symptoms- no significant weight change  Musculoskeletal symptoms- back pain and hip pain  Neuro symptoms- numbness and tingling  MEDICATIONS     Current Outpatient Prescriptions   Medication Sig    verapamil (CALAN-SR) 240 MG CR tablet Take 1 tablet (240 mg total) by mouth nightly   Swallow whole. Do not crush, break, or chew.    hydrochlorothiazide (MICROZIDE) 12.5 MG capsule Take 2 capsules (25 mg total) by mouth every morning    nystatin (MYCOSTATIN) ointment Apply topically 2 times daily as needed   30GM Use topically twice daily on affected area[s]    ALPRAZolam (XANAX) 0.25 MG tablet Take 1 tablet (0.25 mg total) by mouth 2 times daily as needed for Anxiety   Max daily dose: 0.5 mg    penicillin v potassium (VEETIDS) 500 MG tablet TAKE 4 TABLETS ONE HOUR BEFORE APPT AS DIRECTED    diclofenac (VOLTAREN) 1 % gel Apply 2g topically 4 times daily    amoxicillin (AMOXIL) 500 MG capsule Prior to dental procedures    blood glucose monitor (FREESTYLE FREEDOM LITE) kit Use daily for BG testing    blood glucose test strip Test 3-4 times a day. Freestyle Freedom. 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 H/O diabetes    Glucosamine-Chondroitin (OSTEO BI-FLEX REGULAR STRENGTH) 250-200 MG TABS Take by mouth    aspirin 81 MG tablet Take 81 mg by mouth daily    cholecalciferol (VITAMIN D) 1000 UNIT tablet Take 2,000 Units by mouth daily       gabapentin (NEURONTIN) 300 MG capsule Take 1 capsule (300 mg total) by mouth nightly    acetaminophen (TYLENOL) 500 mg tablet Take 500 mg by mouth every 6 hours as needed for Pain       Patient's medications were reviewed and updated at today's visit and no changes were made    ALLERGIES     Lisinopril      Patient's allergies were reviewed and updated at today's visit and no changes were made    PROBLEM LIST     Patient Active Problem List   Diagnosis Code    Sudden hearing loss H91.20    Obesity (BMI 30-39.9) E66.9    Type 2 diabetes mellitus E11.9    Carpal tunnel syndrome G56.00    HTN (hypertension) I10    Osteoarthritis, knee M17.10    Presence of right artificial knee joint Z96.651  Hyperlipidemia, mixed E78.2    Chronic left-sided low back pain with left-sided sciatica M54.42, G89.29         TOBACCO USE HISTORY     History   Smoking Status    Never Smoker   Smokeless Tobacco    Never Used       PHYSICAL EXAM     BP 124/64 (BP Location: Right arm, Patient Position: Sitting, Cuff Size: large adult)    Pulse 70    Ht 1.626 m ('5\' 4"' )    Wt 88 kg (194 lb)    SpO2 97%    BMI 33.30 kg/m         Physical Exam   Constitutional: She is well-developed, well-nourished, and in no distress.   HENT:   Head: Normocephalic and atraumatic.   Right Ear: External ear normal.   Left Ear: External ear normal.   Nose: Nose normal.   Pulmonary/Chest: Effort normal.   Musculoskeletal:        Left hip: She exhibits decreased range of motion and tenderness.        Lumbar back: She exhibits tenderness and pain.   Psychiatric: Affect normal.         RECENT LABS   No results found for this or any  previous visit (from the past 336 hour(s)).         ASSESSMENT/PLAN     1. Chronic neck and back pain    Apply heat to the area. Start on gabapentin, risks, benefits, side effects reviewed with patient.   reassesses in 4 weeks or sooner as needed. Await MRI results    Return if symptoms worsen or fail to improve.    --Patient instructed to call if symptoms are not improving or worsening  --Follow-up arranged    Signed: Earna Coder, MD on 02/19/2018 at 10:26 PM

## 2018-02-18 NOTE — Telephone Encounter (Signed)
Spoke to [facility]  ortho office  [phone], they are going to give a message to Silverio LayLisa Nobles PA per requested from Dr Rennis HardingEllis and will contact pt. They will call back if they have any further concerns/ questions

## 2018-02-18 NOTE — Telephone Encounter (Signed)
Pt sees [facility]. [facility]. She had an xray done of her hip and it showed a small cyst on the pubic bone. Please verify with lisa if this was addressed and if pt needs any kind of f/u

## 2018-02-26 ENCOUNTER — Ambulatory Visit
Admission: RE | Admit: 2018-02-26 | Discharge: 2018-02-26 | Disposition: A | Discharge status: Home / Self Care | Payer: Medicare (Managed Care) | Source: Ambulatory Visit | Attending: Orthopedic Surgery | Admitting: Orthopedic Surgery

## 2018-02-26 DIAGNOSIS — M5136 Other intervertebral disc degeneration, lumbar region: Secondary | ICD-10-CM | POA: Insufficient documentation

## 2018-02-26 DIAGNOSIS — M5440 Lumbago with sciatica, unspecified side: Secondary | ICD-10-CM

## 2018-02-26 DIAGNOSIS — M48061 Spinal stenosis, lumbar region without neurogenic claudication: Secondary | ICD-10-CM | POA: Insufficient documentation

## 2018-02-26 DIAGNOSIS — M4316 Spondylolisthesis, lumbar region: Secondary | ICD-10-CM | POA: Insufficient documentation

## 2018-03-08 ENCOUNTER — Other Ambulatory Visit: Payer: Medicare (Managed Care)

## 2018-03-10 ENCOUNTER — Encounter: Payer: Self-pay | Admitting: Gastroenterology

## 2018-04-01 ENCOUNTER — Telehealth: Payer: Self-pay | Admitting: Primary Care

## 2018-04-01 DIAGNOSIS — M5430 Sciatica, unspecified side: Secondary | ICD-10-CM

## 2018-04-01 NOTE — Telephone Encounter (Signed)
Pt would like a Referral for Physical Therapy would like to go to [facility] [address] she need this for Sciatica please do referral and contact pt when done

## 2018-04-01 NOTE — Telephone Encounter (Signed)
See referral

## 2018-04-04 NOTE — Telephone Encounter (Signed)
Our patient Robin Green [phone] is aware of this referral and will call your office to schedule. We have also included the patient's latest office note. Please advise our office if anything further is needed. Thank you!    This referral was:   - Faxed to referred to provider's office   Name:  [facility]   Address:  [address] [place] [id number]  Phone:  [phone]  Fax:  [phone]  - Patient Notified:  -by phone:  spoke to patient

## 2018-04-06 ENCOUNTER — Telehealth: Payer: Self-pay

## 2018-04-06 DIAGNOSIS — R7303 Prediabetes: Secondary | ICD-10-CM

## 2018-04-06 NOTE — Telephone Encounter (Signed)
See referral. Please notify pt

## 2018-04-06 NOTE — Telephone Encounter (Signed)
This referral was:   - Faxed to referred to provider's office  Name:  [facility]  Phone: [phone]  - Patient Notified:  -by phone: spoke to patient     All set.

## 2018-04-06 NOTE — Telephone Encounter (Signed)
Robin PattersonNancy Green calls today asking for a referral to Endocrinologist  For their diabetes  Patient states that she has discussed this with  Dr.Ellis  Harriett SineNancy notes she has no preference but wants a doctor to help her with her diabetes    Please advise

## 2018-04-07 ENCOUNTER — Telehealth: Payer: Self-pay

## 2018-04-07 NOTE — Telephone Encounter (Signed)
NA Diabetic education ( dietary)

## 2018-04-07 NOTE — Telephone Encounter (Signed)
Please review and advise.

## 2018-04-08 ENCOUNTER — Encounter: Payer: Self-pay | Admitting: Gastroenterology

## 2018-04-18 ENCOUNTER — Ambulatory Visit: Payer: Medicare (Managed Care) | Admitting: Primary Care

## 2018-04-26 ENCOUNTER — Ambulatory Visit: Payer: Medicare (Managed Care) | Admitting: Nutrition

## 2018-05-09 ENCOUNTER — Telehealth: Payer: Self-pay | Admitting: Primary Care

## 2018-05-09 NOTE — Telephone Encounter (Signed)
[facility], letter mailed and sent via My chart reschedule physical  (last physical >38yr)

## 2018-05-16 ENCOUNTER — Encounter: Payer: Self-pay | Admitting: Gastroenterology

## 2018-05-17 ENCOUNTER — Other Ambulatory Visit: Payer: Self-pay | Admitting: Primary Care

## 2018-05-17 MED ORDER — ALPRAZOLAM 0.25 MG PO TABS *I*
0.2500 mg | ORAL_TABLET | Freq: Two times a day (BID) | ORAL | 0 refills | Status: DC | PRN
Start: 2018-05-17 — End: 2018-08-30

## 2018-05-17 NOTE — Telephone Encounter (Signed)
1 in mm PMP checked last disp 06/05/2017 last ov 02/18/18  Next appt 06/27/18  Please ERX    This report was requested by: Shepard GeneralSharon A Skyy Mcknight   Reference #: [id number]   [name]'s Prescriptions  Patient Name: Robin Pattersonancy Canto Birth Date: [birth year]   Address: [address] [place], [place] [id number] Sex: Female   Rx Written Rx Dispensed Drug Quantity Days Supply Prescriber Name Payment Method Dispenser   06/04/2017 06/05/2017 alprazolam 0.25 mg tablet  60 30 Hetty ElyEllis, Josephine MD Insurance [facility] [phone]

## 2018-06-06 ENCOUNTER — Other Ambulatory Visit: Payer: Self-pay | Admitting: Primary Care

## 2018-06-06 MED ORDER — VERAPAMIL HCL CR 240 MG PO TBCR *I*
240.0000 mg | ORAL_TABLET | Freq: Every evening | ORAL | 1 refills | Status: DC
Start: 2018-06-06 — End: 2018-11-17

## 2018-06-06 NOTE — Telephone Encounter (Signed)
1 in mm last Ov 02/18/18  Next appt 06/27/18  Please ERX

## 2018-06-08 ENCOUNTER — Ambulatory Visit: Payer: Medicare (Managed Care) | Admitting: Primary Care

## 2018-06-09 ENCOUNTER — Other Ambulatory Visit: Payer: Self-pay | Admitting: Gastroenterology

## 2018-06-15 ENCOUNTER — Other Ambulatory Visit: Payer: Self-pay | Admitting: Primary Care

## 2018-06-15 MED ORDER — HYDROCHLOROTHIAZIDE 12.5 MG PO CAPS *I*
25.0000 mg | ORAL_CAPSULE | Freq: Every morning | ORAL | 1 refills | Status: DC
Start: 2018-06-15 — End: 2019-01-17

## 2018-06-15 NOTE — Telephone Encounter (Signed)
1 in mm Last OV 621/19  Next appt 06/27/18  Please ERX

## 2018-06-22 ENCOUNTER — Other Ambulatory Visit
Admission: RE | Admit: 2018-06-22 | Discharge: 2018-06-22 | Disposition: A | Discharge status: Home / Self Care | Payer: Medicare (Managed Care) | Source: Ambulatory Visit | Attending: Primary Care | Admitting: Primary Care

## 2018-06-22 DIAGNOSIS — R7303 Prediabetes: Secondary | ICD-10-CM | POA: Insufficient documentation

## 2018-06-22 DIAGNOSIS — I10 Essential (primary) hypertension: Secondary | ICD-10-CM

## 2018-06-22 LAB — COMPREHENSIVE METABOLIC PANEL
ALT: 22 U/L (ref 0–35)
AST: 23 U/L (ref 0–35)
Albumin: 4.4 g/dL (ref 3.5–5.2)
Alk Phos: 61 U/L (ref 35–105)
Anion Gap: 12 (ref 7–16)
Bilirubin,Total: 0.4 mg/dL (ref 0.0–1.2)
CO2: 30 mmol/L — ABNORMAL HIGH (ref 20–28)
Calcium: 9.8 mg/dL (ref 8.6–10.2)
Chloride: 100 mmol/L (ref 96–108)
Creatinine: 0.83 mg/dL (ref 0.51–0.95)
GFR,Black: 87 *
GFR,Caucasian: 75 *
Glucose: 92 mg/dL (ref 60–99)
Lab: 9 mg/dL (ref 6–20)
Potassium: 4.3 mmol/L (ref 3.3–5.1)
Sodium: 142 mmol/L (ref 133–145)
Total Protein: 7.6 g/dL (ref 6.3–7.7)

## 2018-06-22 LAB — CBC
Hematocrit: 41 % (ref 34–45)
Hemoglobin: 13.2 g/dL (ref 11.2–15.7)
MCH: 29 pg/cell (ref 26–32)
MCHC: 32 g/dL (ref 32–36)
MCV: 91 fL (ref 79–95)
Platelets: 301 10*3/uL (ref 160–370)
RBC: 4.6 MIL/uL (ref 3.9–5.2)
RDW: 14.6 % — ABNORMAL HIGH (ref 11.7–14.4)
WBC: 5.9 10*3/uL (ref 4.0–10.0)

## 2018-06-22 LAB — URINALYSIS REFLEX TO CULTURE
Blood,UA: NEGATIVE
Ketones, UA: NEGATIVE
Leuk Esterase,UA: NEGATIVE
Nitrite,UA: NEGATIVE
Protein,UA: NEGATIVE mg/dL
Specific Gravity,UA: 1.005 (ref 1.002–1.030)
pH,UA: 7 (ref 5.0–8.0)

## 2018-06-22 LAB — LIPID PANEL
Chol/HDL Ratio: 2.1
Cholesterol: 207 mg/dL — AB
HDL: 97 mg/dL — ABNORMAL HIGH (ref 40–60)
LDL Calculated: 98 mg/dL
Non HDL Cholesterol: 110 mg/dL
Triglycerides: 62 mg/dL

## 2018-06-22 LAB — MICROALBUMIN, URINE, RANDOM
Creatinine,UR: 32 mg/dL (ref 20–300)
Microalbumin,UR: <1.2 mg/dL

## 2018-06-22 LAB — HEMOGLOBIN A1C: Hemoglobin A1C: 6.7 % — ABNORMAL HIGH

## 2018-06-27 ENCOUNTER — Encounter: Payer: Self-pay | Admitting: Primary Care

## 2018-06-27 ENCOUNTER — Ambulatory Visit: Payer: Medicare (Managed Care) | Attending: Primary Care | Admitting: Primary Care

## 2018-06-27 ENCOUNTER — Other Ambulatory Visit: Payer: Self-pay

## 2018-06-27 VITALS — BP 146/86 | HR 62 | Ht 64.0 in | Wt 205.0 lb

## 2018-06-27 DIAGNOSIS — R7303 Prediabetes: Secondary | ICD-10-CM

## 2018-06-27 DIAGNOSIS — G4700 Insomnia, unspecified: Secondary | ICD-10-CM

## 2018-06-27 DIAGNOSIS — M542 Cervicalgia: Secondary | ICD-10-CM

## 2018-06-27 DIAGNOSIS — G8929 Other chronic pain: Secondary | ICD-10-CM

## 2018-06-27 DIAGNOSIS — E782 Mixed hyperlipidemia: Secondary | ICD-10-CM

## 2018-06-27 DIAGNOSIS — I10 Essential (primary) hypertension: Secondary | ICD-10-CM

## 2018-06-27 DIAGNOSIS — E669 Obesity, unspecified: Secondary | ICD-10-CM

## 2018-06-27 DIAGNOSIS — Z0000 Encounter for general adult medical examination without abnormal findings: Secondary | ICD-10-CM

## 2018-06-27 DIAGNOSIS — M549 Dorsalgia, unspecified: Secondary | ICD-10-CM

## 2018-06-27 DIAGNOSIS — M5430 Sciatica, unspecified side: Secondary | ICD-10-CM

## 2018-06-27 DIAGNOSIS — H9122 Sudden idiopathic hearing loss, left ear: Secondary | ICD-10-CM

## 2018-06-27 NOTE — Progress Notes (Signed)
No Flu shot

## 2018-06-27 NOTE — Progress Notes (Signed)
Today we reviewed and updated Robin Green smoking status, activities of daily living, depression screen, fall risk, medications and allergies.   I have counseled the patient in the above areas.     Subjective:     Chief Complaint: Robin Green is a 62 y.o. female here for a/an Subsequent Annual Medicare Visit; Lab Results; and Annual Exam    In general, Robin Green rates their overall health as:  good      Patient Care Team:  Earna Coder, MD as PCP - General (Primary Care)  Noah Delaine, AUD as PCP - CCP-AUDIOLOGY     Current Outpatient Medications on File Prior to Visit   Medication Sig Dispense Refill    magnesium gluconate (MAG-G) 500 MG tablet Take 500 mg by mouth daily      TURMERIC CURCUMIN PO Take 1,000 mg by mouth daily      Melatonin 3 MG CAPS Take 3 mg by mouth nightly as needed      hydrochlorothiazide (MICROZIDE) 12.5 MG capsule Take 2 capsules (25 mg total) by mouth every morning 180 capsule 1    verapamil (CALAN-SR) 240 MG CR tablet Take 1 tablet (240 mg total) by mouth nightly   Swallow whole. Do not crush, break, or chew. 90 tablet 1    blood glucose monitor (FREESTYLE FREEDOM LITE) kit Use daily for BG testing      blood glucose test strip Test 3-4 times a day. Freestyle Freedom. Dx: DM-2      Non-System Medication Abbott freestyle lifestyle touch dx: DM-2      Non-System Medication BP cuff, large Dx: HTN      POTASSIUM PO Take 595 mg by mouth daily      Glucosamine-Chondroitin (OSTEO BI-FLEX REGULAR STRENGTH) 250-200 MG TABS Take by mouth      cholecalciferol (VITAMIN D) 1000 UNIT tablet Take 2,000 Units by mouth daily         ALPRAZolam (XANAX) 0.25 MG tablet Take 1 tablet (0.25 mg total) by mouth 2 times daily as needed for Anxiety   Max daily dose: 0.5 mg 60 tablet 0    gabapentin (NEURONTIN) 300 MG capsule Take 1 capsule (300 mg total) by mouth nightly (Patient not taking: Reported on 06/27/2018) 30 capsule 1    acetaminophen (TYLENOL) 500 mg tablet Take 500 mg  by mouth every 6 hours as needed for Pain      nystatin (MYCOSTATIN) ointment Apply topically 2 times daily as needed   30GM Use topically twice daily on affected area[s] 30 g 2    penicillin v potassium (VEETIDS) 500 MG tablet TAKE 4 TABLETS ONE HOUR BEFORE APPT AS DIRECTED      diclofenac (VOLTAREN) 1 % gel Apply 2g topically 4 times daily      amoxicillin (AMOXIL) 500 MG capsule Prior to dental procedures      generic DME 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      Allergies   Allergen Reactions    Lisinopril Other (See Comments)     Dizziness and cough      Patient Active Problem List    Diagnosis Date Noted    Chronic left-sided low back pain with left-sided sciatica 12/16/2017    Hyperlipidemia, mixed 03/17/2017    Sudden  hearing loss 02/25/2017    Obesity (BMI 30-39.9) 02/25/2017    Type 2 diabetes mellitus 02/25/2017    Carpal tunnel syndrome 02/25/2017    HTN (hypertension) 02/25/2017    Osteoarthritis, knee 02/25/2017    Presence of right artificial knee joint 12/25/2015     Past Medical History:   Diagnosis Date    Anxiety     Diabetes     Diabetes mellitus     High blood pressure     Hypertension      Past Surgical History:   Procedure Laterality Date    BREAST BIOPSY  2008    CHOLECYSTECTOMY, LAPAROSCOPIC      JOINT REPLACEMENT      left TKR    KNEE REPLACEMENT  2008/2017    x2     Family History   Problem Relation Age of Onset    Breast cancer Mother     Diabetes Sister     Hypertension Sister     Diabetes Maternal Uncle     Coronary artery disease Maternal Uncle     Prostate cancer Father     Dementia Father      Social History     Socioeconomic History    Marital status: Widowed     Spouse name: Not on file    Number of children: Not on file    Years of education: Not on file     Highest education level: Not on file   Occupational History    Not on file   Tobacco Use    Smoking status: Never Smoker    Smokeless tobacco: Never Used   Substance and Sexual Activity    Alcohol use: No    Drug use: No    Sexual activity: Not Currently   Social History Narrative    Not on file       Objective:     Vital Signs: BP 146/86 (BP Location: Left arm, Patient Position: Sitting, Cuff Size: large adult)    Pulse 62    Ht 1.626 m ('5\' 4"' )    Wt 93 kg (205 lb)    SpO2 97%    BMI 35.19 kg/m    BMI: Body mass index is 35.19 kg/m.    Vision Screening Results (Welcome visit only):  No exam data present    Depression Screening Results:  Recent Review Flowsheet Data     PHQ Scores 03/17/2017    PSQ2 Q1 - Interest/Pleasure N    PSQ2 Q2 - Down, Depressed, Hopeless N        Opioid Use/DAST- 10 Screening Results:   How many times in the past year have you used an illegal drug or used a prescription medication for nonmedical reasons?: 0 (06/27/2018 12:02 PM)    Activities of Daily Living/Functional Screening Results:  Is the person deaf or does he/she have serious difficulty hearing?: N  Is this person blind or does he/she have serious difficulty seeing even when wearing glasses?: N  *Vision Status: Visual aid   Does this person have serious difficulty walking or climbing stairs?: N  Does this person have difficulty dressing or bathing?: N  *Shopping: Independent  *House Keeping: Independent  *Managing Own Medications: Independent  *Handling Finances: Independent  Difficulty doing errands due to a physicial, mental or emotional condition: No  Difficulty remembering or making decisions due to a physicial, mental or emotional condition: No      Fall Risk Screening Results:  Have you fallen in the last year?:  No  Do you feel you are at risk for falling?: No      Assessment and Plan:      Cognitive Function:  Recall of recent and remote events appears:  Normal      Advanced Care Planning:  was discussed and the  paperwork can be found in the scanned media section     The following health maintenance plan was reviewed with the patient:    Health Maintenance Topics with due status: Overdue       Topic Date Due    IMM-ZOSTER 11/08/2012    DEPRESSION SCREEN YEARLY 03/17/2018    IMM-INFLUENZA 05/01/2018    OPHTHALMOLOGY EXAM 06/16/2018     Health Maintenance Topics with due status: Not Due       Topic Last Completion Date    Colon Cancer Screening Other 06/26/2016    Cervical Cancer Screening 03/30/2017    FOOT EXAM 09/17/2017    Breast Cancer Screening Other 06/09/2018    HEMOGLOBIN A1C 06/22/2018    URINE MICROALBUMIN 06/22/2018    LIPID DISORDER SCREENING 06/22/2018     Health Maintenance Topics with due status: Completed       Topic Last Completion Date    HEPATITIS C SCREENING OFFERED 06/10/2016    HIV TESTING OFFERED 03/17/2017     This health maintenance schedule, identified risks, a list of orders placed today and patient goals have been provided to Robin Green in the after visit summary.     Plan for any concerns identified during screening or risk assessments:  n/a

## 2018-06-27 NOTE — Progress Notes (Signed)
[facility]  Progress Note    Reason For Visit   Adult Comprehensive Physical Exam.    HPI   Today has concerns of: patient has seen ortho and has been set up for surgery. Pt is very upset and worried and is not ready to proceed with surgery.       The Problem List, MHx, SHx, FHx, Immunizations, Medications, and Allergies were reviewed with the patient today during the appointment and updates were made today.    Allergies   Allergen Reactions    Lisinopril Other (See Comments)     Dizziness and cough      Current Outpatient Medications   Medication Sig Dispense Refill    magnesium gluconate (MAG-G) 500 MG tablet Take 500 mg by mouth daily      TURMERIC CURCUMIN PO Take 1,000 mg by mouth daily      Melatonin 3 MG CAPS Take 3 mg by mouth nightly as needed      hydrochlorothiazide (MICROZIDE) 12.5 MG capsule Take 2 capsules (25 mg total) by mouth every morning 180 capsule 1    verapamil (CALAN-SR) 240 MG CR tablet Take 1 tablet (240 mg total) by mouth nightly   Swallow whole. Do not crush, break, or chew. 90 tablet 1    blood glucose monitor (FREESTYLE FREEDOM LITE) kit Use daily for BG testing      blood glucose test strip Test 3-4 times a day. Freestyle Freedom. Dx: DM-2      Non-System Medication Abbott freestyle lifestyle touch dx: DM-2      Non-System Medication BP cuff, large Dx: HTN      POTASSIUM PO Take 595 mg by mouth daily      Glucosamine-Chondroitin (OSTEO BI-FLEX REGULAR STRENGTH) 250-200 MG TABS Take by mouth      cholecalciferol (VITAMIN D) 1000 UNIT tablet Take 2,000 Units by mouth daily         ALPRAZolam (XANAX) 0.25 MG tablet Take 1 tablet (0.25 mg total) by mouth 2 times daily as needed for Anxiety   Max daily dose: 0.5 mg 60 tablet 0    nystatin (MYCOSTATIN) ointment Apply topically 2 times daily as needed   30GM Use topically twice daily on affected area[s] 30 g 2    penicillin v potassium (VEETIDS) 500 MG tablet TAKE 4 TABLETS ONE HOUR BEFORE APPT AS DIRECTED       diclofenac (VOLTAREN) 1 % gel Apply 2g topically 4 times daily      amoxicillin (AMOXIL) 500 MG capsule Prior to dental procedures      generic DME Carbon fiber plate for DJD mid foot left H/O diabetes (Patient not taking: Reported on 06/27/2018) 1 each 0     No current facility-administered medications for this visit.      Patient Active Problem List    Diagnosis Date Noted    Chronic left-sided low back pain with left-sided sciatica 12/16/2017    Hyperlipidemia, mixed 03/17/2017    Sudden hearing loss 02/25/2017    Obesity (BMI 30-39.9) 02/25/2017    Type 2 diabetes mellitus 02/25/2017    Carpal tunnel syndrome 02/25/2017    HTN (hypertension) 02/25/2017    Osteoarthritis, knee 02/25/2017    Presence of right artificial knee joint 12/25/2015     Past Medical History:   Diagnosis Date    Anxiety     Diabetes     Diabetes mellitus     High blood pressure     Hypertension  Past Surgical History:   Procedure Laterality Date    BREAST BIOPSY  2008    CHOLECYSTECTOMY, LAPAROSCOPIC      JOINT REPLACEMENT      left TKR    KNEE REPLACEMENT  2008/2017    x2     Family History   Problem Relation Age of Onset    Breast cancer Mother     Diabetes Sister     Hypertension Sister     Diabetes Maternal Uncle     Coronary artery disease Maternal Uncle     Prostate cancer Father     Dementia Father      Social History     Socioeconomic History    Marital status: Widowed     Spouse name: Not on file    Number of children: Not on file    Years of education: Not on file    Highest education level: Not on file   Tobacco Use    Smoking status: Never Smoker    Smokeless tobacco: Never Used   Substance and Sexual Activity    Alcohol use: No    Drug use: No    Sexual activity: Not Currently   Other Topics Concern    Not on file   Social History Narrative    Not on file          ROS    Systemic symptoms: Not feeling tired (fatigue).  No fever, no chills, and no recent weight change.  Head symptoms:  No headache.  Eye symptoms: No vision problems.  Otolaryngeal symptoms: No hearing loss, no earache, and no tinnitus.  No nasal symptoms  and no sore throat.  Cardiovascular symptoms: No chest pain or discomfort, no palpitations, and no intermittent leg claudication.  Pulmonary symptoms: No dyspnea, no cough, and no wheezing.  Gastrointestinal symptoms: Normal appetite, no heartburn, no nausea, no vomiting, no abdominal pain, no change in stool, no diarrhea, and no constipation.  Genitourinary symptoms: No hematuria  and no increase in urinary frequency.  No dysuria.  Hematologic symptoms: No easy bleeding  and no tendency for easy bruising.  Musculoskeletal symptoms: No back pain  and no myalgias.  No localized joint pain  and no localized joint swelling.  Neurological symptoms: No dizziness, no lightheadedness, no fainting, no memory lapses or loss, no motor disturbances, and no sensory disturbances.  Psychological symptoms: No anxiety  and no depression.  Skin symptoms: No pruritus  and no rash.  Vitals  Blood pressure 146/86, pulse 62, height 1.626 m ('5\' 4"' ), weight 93 kg (205 lb), SpO2 97 %.      Physical Exam   Constitutional: She is oriented to person, place, and time. She appears distressed.   HENT:   Head: Normocephalic and atraumatic.   Right Ear: External ear normal.   Left Ear: External ear normal.   Nose: Nose normal.   Mouth/Throat: Oropharynx is clear and moist. No oropharyngeal exudate.   Eyes: Pupils are equal, round, and reactive to light. Right eye exhibits no discharge. Left eye exhibits no discharge. No scleral icterus.   Neck: Normal range of motion. Neck supple. No JVD present. No thyromegaly present.   Cardiovascular: Normal rate, regular rhythm and normal heart sounds. Exam reveals no gallop.   No murmur heard.  Pulmonary/Chest: Effort normal and breath sounds normal. No respiratory distress. She has no wheezes. She has no rales. She exhibits no tenderness.   Abdominal: Soft. Bowel sounds  are normal. She exhibits no distension and no  mass. There is no tenderness. There is no rebound and no guarding. Musculoskeletal: Normal range of motion.     Lymphadenopathy:     She has no cervical adenopathy.   Neurological: She is alert and oriented to person, place, and time. No cranial nerve deficit.   Skin: Skin is warm and dry. She is not diaphoretic.   Psychiatric: Mood and affect normal.   Crying and very upset       Lab Results  Lab Results   Component Value Date    WBC 5.9 06/22/2018    HGB 13.2 06/22/2018    HCT 41 06/22/2018    MCV 91 06/22/2018    PLT 301 06/22/2018       Chemistry        Lab results: 06/22/18  1354   Sodium 142   Potassium 4.3   Chloride 100   CO2 30*   GFR,Caucasian 75   GFR,Black 87   UN 9   Creatinine 0.83        Lab results: 06/22/18  1354   Glucose 92   Calcium 9.8   Total Protein 7.6   Albumin 4.4   ALT 22   AST 23   Alk Phos 61   Bilirubin,Total 0.4          Lab Results   Component Value Date    CHOL 207 (!) 06/22/2018    HDL 97 (H) 06/22/2018    LDLC 98 06/22/2018    TRIG 62 06/22/2018    CHHDC 2.1 06/22/2018     No results found for: TSH  Hemoglobin A1C   Date Value Ref Range Status   06/22/2018 6.7 (H) % Final     Comment:     Ref Range <=5.6  HbA1c values of 5.7-6.4% indicate an increased risk for developing  diabetes mellitus.  HbA1c values greater than or equal to 6.5% are diagnostic of  diabetes mellitus.  For diagnosis of diabetes in individuals without unequivocal  hyperglycemia, results should be confirmed by repeat testing.       EKG done today shows normal sinus rhythm at 66 bpm.  Normal P axis.  No ST-T changes.    Assessment   This is a 62 y.o.female who comes in today for a physical exam    1. Preventative health care  Reviewed healthy lifestyle measures. Reviewed current guidelines re nutrition, sleep, hydration and exercise. Up to date on routine preventive care and immunizations.     2. Prediabetes  Recommend low carb diet.  Continue to work on wt loss. .   Exercise 30 minutes most days of the week.  Check labs as ordered.  - Hemoglobin A1c; Future  - Comprehensive metabolic panel; Future  - Lipid add Rfx to Drt LDL if Trig >400; Future    3. Chronic neck and back pain  Reviewed with patient to call neurosurgery and discuss her concerns with them re: surgery. She is very upset and crying and unable to focus on anything , she is distraught    4. Essential hypertension  Recommend low-salt diet.  Recommend exercise for 30 minutes most days of the week.  Maintain a healthy weight.  .  Goal blood pressure at 130/80. Continue verapamil and HCTZ.     5. Hyperlipidemia, mixed  Recommend low cholesterol diet.  Exercise as much as possible.   Check labs as ordered.    6. Obesity (BMI 30-39.9)  Continue to work on wt loss    7. Insomnia,  unspecified type  Continue current management    8. Sudden left hearing loss  Managed by ENT    9. Sciatica  Has seen neurosurgery. Notes reviewed.    10. Routine medical exam  See plan under preventive care.  - EKG 12 lead; Future          Follow up: Return in about 1 year (around 06/28/2019).    Author: Earna Coder, MD  Note created: 06/27/2018  at: 12:21 PM

## 2018-06-27 NOTE — Patient Instructions (Signed)
Thank you for completing your Subsequent Annual Medicare Visit; Lab Results; and Annual Exam   with [place] today.     The purpose of this visits was to:     Screen for disease   Assess risk of future medical problems   Help develop a healthy lifestyle   Update vaccines   Get to know your doctor in case of an illness    Patient Care Team:  Hetty Ely, MD as PCP - General (Primary Care)  Verita Schneiders, AUD as PCP - CCP-AUDIOLOGY     Medicare 5 Year Plan    The following items were identified as areas of concern during your screening today:  BMI greater than 25 - This is a risk for Heart Attack, Stroke, High Blood Pressure, Diabetes, High Cholesterol and other complications.       The Health Maintenance table below identifies screening tests and immunizations recommended by your health care team:  Health Maintenance   Topic Date Due    IMM-ZOSTER (2 of 3) 11/08/2012    DEPRESSION SCREEN YEARLY  03/17/2018    IMM-INFLUENZA (1) 05/01/2018    OPHTHALMOLOGY EXAM  06/16/2018    FOOT EXAM  09/17/2018    HEMOGLOBIN A1C  12/22/2018    URINE MICROALBUMIN  06/23/2019    LIPID DISORDER SCREENING  06/23/2019    Cervical Cancer Screening  03/30/2020    Breast Cancer Screening Other  06/09/2020    Colon Cancer Screening Other  06/26/2021    HIV TESTING OFFERED  Completed    HEPATITIS C SCREENING OFFERED  Completed     In addition, goals and orders placed to address these recommendations are listed in the "Today's Visit" section.    We wish you the best of health and look forward to seeing you again next year for your Annual Medicare Wellness Visit.     If you have any health care concerns before then, please do not hesitate to contact us.

## 2018-07-11 ENCOUNTER — Other Ambulatory Visit: Payer: Self-pay | Admitting: Primary Care

## 2018-07-11 MED ORDER — NYSTATIN 100000 UNIT/GM EX POWD  *I*
Freq: Two times a day (BID) | CUTANEOUS | 0 refills | Status: DC | PRN
Start: 2018-07-11 — End: 2018-09-29

## 2018-07-11 NOTE — Telephone Encounter (Signed)
Updated EMR last OV 06/27/18  Next appt 09/29/2018  Please ERX

## 2018-07-11 NOTE — Telephone Encounter (Signed)
Pt returned our call she stated that she uses the Nystatin Powder under both Breasts and stomach

## 2018-07-11 NOTE — Telephone Encounter (Signed)
Called and left VM asking pt to call and let us know where she is applying the nystatin ointment BiD than will update on script.    1 in mm last ov 0/28/19  Next appt 09/29/2018 Please ERX

## 2018-07-15 ENCOUNTER — Other Ambulatory Visit: Payer: Self-pay | Admitting: Primary Care

## 2018-07-15 MED ORDER — NYSTATIN 100000 UNIT/GM EX OINT *I*
TOPICAL_OINTMENT | Freq: Two times a day (BID) | CUTANEOUS | 2 refills | Status: DC | PRN
Start: 2018-07-15 — End: 2021-03-17

## 2018-07-15 NOTE — Telephone Encounter (Signed)
Last appointment: 06/27/2018  Next appointment: 09/29/2018    Mm updated

## 2018-08-30 ENCOUNTER — Other Ambulatory Visit: Payer: Self-pay | Admitting: Primary Care

## 2018-08-30 MED ORDER — ALPRAZOLAM 0.25 MG PO TABS *I*
0.2500 mg | ORAL_TABLET | Freq: Two times a day (BID) | ORAL | 0 refills | Status: DC | PRN
Start: 2018-08-30 — End: 2019-08-08

## 2018-08-30 NOTE — Telephone Encounter (Signed)
06-27-18  09-29-18      This report was requested by: Rochele PagesElizabeth Kay Livier Hendel   Reference #: [id number]   [name] Prescriptions  Patient Name: Robin Green Birth Date: Jun 08, 1956   Address: [address] [place], [place] [id number] Sex: Female     Rx Written Rx Dispensed Drug Quantity Days Supply Prescriber Name     05/17/2018 05/21/2018 alprazolam 0.25 mg tablet  60 30 Hetty ElyEllis, Josephine MD

## 2018-09-08 LAB — UNMAPPED LAB RESULTS
ABO RH Blood Type (HT): A POS — NL
Antibody Screen (HT): NEGATIVE — NL
HIV 1&2 ANTIGEN/ANTIBODY (HT): NONREACTIVE — NL
Hemoglobin (HGB) (HT): 13.6 g/dL — NL (ref 11.5–16.0)

## 2018-09-13 ENCOUNTER — Other Ambulatory Visit: Payer: Self-pay | Admitting: Gastroenterology

## 2018-09-13 NOTE — Unmapped External Note (Signed)
Patient DOB: Sep 18, 1955    Post Operative/Procedure Note    Procedure(s) (LRB):  BILATERAL L4-5 DECOMPRESSIVE LUMBAR LAMINECTOMY WITH INSTRUMENTED FUSION (N/A)      Findings: Tight stenosis L4-5    Complications: no    Surgeon(s):  Lise Auer, MD  Jeananne Rama, MD     Assistants: Jeananne Rama, MD    Anesthesia:  General    Blood Loss:  300 cc    Fluids: See anesthesia record           Drains: JP drain to bulb suction, lumbar region    Condition:  stable              Specimens Removed: No    Skin closed with staples                  Postoperative Diagnosis: LUMBAR SPONDYLOLISTHESIS , SPONDYLOSIS    Jeananne Rama, MD

## 2018-09-17 ENCOUNTER — Encounter: Payer: Medicare (Managed Care) | Admitting: Primary Care

## 2018-09-17 DIAGNOSIS — M4316 Spondylolisthesis, lumbar region: Secondary | ICD-10-CM

## 2018-09-17 DIAGNOSIS — E119 Type 2 diabetes mellitus without complications: Secondary | ICD-10-CM

## 2018-09-17 DIAGNOSIS — Z48811 Encounter for surgical aftercare following surgery on the nervous system: Secondary | ICD-10-CM

## 2018-09-17 DIAGNOSIS — M48062 Spinal stenosis, lumbar region with neurogenic claudication: Secondary | ICD-10-CM

## 2018-09-19 ENCOUNTER — Encounter: Payer: Self-pay | Admitting: Gastroenterology

## 2018-09-20 ENCOUNTER — Telehealth: Payer: Self-pay | Admitting: Primary Care

## 2018-09-20 NOTE — Telephone Encounter (Signed)
Lmtco

## 2018-09-20 NOTE — Telephone Encounter (Signed)
Forwarding to Dr. Newman to advise

## 2018-09-20 NOTE — Telephone Encounter (Signed)
Can continue to track and review with Dr. Rennis Harding, has upcoming appt in 1 week with her as well.  This is only mild bp elevation, no acute management needed

## 2018-09-20 NOTE — Telephone Encounter (Signed)
[facility] called  To let the Nurses know that pt's BP is high today it has been 158/92 please call [facility] to advise

## 2018-09-21 NOTE — Telephone Encounter (Signed)
Spoke to pt and she is doing as well as see can. She is going to the surgeon to get the staples removed today.they will check her BP than too. She is aware that she has an appt on 09/29/2018 with you. Also told her that someone would be coming out this week to recheck her Bp again. Told her to call if she has any other concerns

## 2018-09-21 NOTE — Telephone Encounter (Signed)
noted 

## 2018-09-21 NOTE — Telephone Encounter (Signed)
Called [facility] and she is going to make sure that pt BP will be checked again this week and give [place] a call

## 2018-09-23 ENCOUNTER — Telehealth: Payer: Medicare (Managed Care) | Admitting: Primary Care

## 2018-09-23 NOTE — Telephone Encounter (Signed)
Fyi.

## 2018-09-23 NOTE — Telephone Encounter (Signed)
Maralyn Sago from [facility] called to let dr Rennis Harding know that pt's BP was 140/70 taken at 12:10pm today FYI

## 2018-09-23 NOTE — Telephone Encounter (Signed)
Please call pt and see how she is doing. Would recommend continuing to monitor Bps. Can also be elevated from pain

## 2018-09-26 ENCOUNTER — Encounter: Payer: Self-pay | Admitting: Gastroenterology

## 2018-09-27 ENCOUNTER — Other Ambulatory Visit
Admission: RE | Admit: 2018-09-27 | Discharge: 2018-09-27 | Disposition: A | Discharge status: Home / Self Care | Payer: Medicare (Managed Care) | Source: Ambulatory Visit | Attending: Primary Care | Admitting: Primary Care

## 2018-09-27 DIAGNOSIS — R7303 Prediabetes: Secondary | ICD-10-CM | POA: Insufficient documentation

## 2018-09-27 DIAGNOSIS — E785 Hyperlipidemia, unspecified: Secondary | ICD-10-CM | POA: Insufficient documentation

## 2018-09-27 LAB — LIPID PANEL
Chol/HDL Ratio: 2.3
Cholesterol: 169 mg/dL
HDL: 73 mg/dL — ABNORMAL HIGH (ref 40–60)
LDL Calculated: 85 mg/dL
Non HDL Cholesterol: 96 mg/dL
Triglycerides: 56 mg/dL

## 2018-09-27 LAB — COMPREHENSIVE METABOLIC PANEL
ALT: 21 U/L (ref 0–35)
AST: 23 U/L (ref 0–35)
Albumin: 3.8 g/dL (ref 3.5–5.2)
Alk Phos: 60 U/L (ref 35–105)
Anion Gap: 9 (ref 7–16)
Bilirubin,Total: 0.2 mg/dL (ref 0.0–1.2)
CO2: 29 mmol/L — ABNORMAL HIGH (ref 20–28)
Calcium: 9.7 mg/dL (ref 8.6–10.2)
Chloride: 101 mmol/L (ref 96–108)
Creatinine: 0.94 mg/dL (ref 0.51–0.95)
GFR,Black: 75 *
GFR,Caucasian: 65 *
Glucose: 118 mg/dL — ABNORMAL HIGH (ref 60–99)
Lab: 12 mg/dL (ref 6–20)
Potassium: 4.5 mmol/L (ref 3.3–5.1)
Sodium: 139 mmol/L (ref 133–145)
Total Protein: 7.1 g/dL (ref 6.3–7.7)

## 2018-09-27 LAB — HEMOGLOBIN A1C: Hemoglobin A1C: 7 % — ABNORMAL HIGH

## 2018-09-27 NOTE — Telephone Encounter (Signed)
Patient called back and states she has been feeling pretty good and her pain has been better  She will monitor b/p's when she can and see Dr. Rennis Harding at her appointment Thursday

## 2018-09-27 NOTE — Telephone Encounter (Signed)
LMTCO.

## 2018-09-28 ENCOUNTER — Telehealth: Payer: Medicare (Managed Care) | Admitting: Primary Care

## 2018-09-28 NOTE — Telephone Encounter (Signed)
Out of System Transitions Care Management Documentation:        Hospital Admission Date/Discharge Date:    Admitted 09/13/2018, Discharged 09/16/2018    Discharge Diagnosis at the time of discharge:     Lumbar Spondylolisthesis/Spondylosis    Hospital [facility]   [facility]   Discharge Disposition: Home     Course of Hospital Stay:         Future Appointments:   09/21/2018 Neita GoodnightJacob Raifstanger, PA, Ortho - staple removal  09/29/2018 - Dr. Hetty ElyJosephine Ellis, PCP  10/11/2018 -Dr. Rebecca EatonMauer - Post op follow up    Significant Med Changes: Yes  Diazepam 5 mg - 1 tablet every 8 hours as needed  Oxycodone-Acetaminophen 5-325 mg - 1-2 tabs every t hours as needed    Discharge medications reviewed against outpatient MEDICAL RECORD NUMBERyes    Reviewed medications with: Patient     Patient has all new medications and is taking them as prescribed: yes    Caregiver information:   Patient is caring for herself with the assistance of her sister if she needs it       Transportation for next appointment secured, by whom: Patient states her sister will be driving her to her appointment today with Dr. Rennis HardingEllis    Home care agency and services provided: No    Equipment needs: Rolling walker - patient has walker in her home already    Anticoagulation therapy  no    Future Labs needed: no    Future imaging:  no    Patient report / concerns about present health:  Patient states she is "doing just fine". Reports post surgical back pain at "3/10". Patient states she has not used Diazepam since 09/25/2018 and last dose of Oxycodone-Acetaminophen was on 09/24/2018. She still has pain medications on hand "but I don't think I've needed them the last few days". "I'm still a little stiff from the surgery but I'm doing okay".    Patient Care Management assessment and plan of care/next steps:  Patient is ambulating independently. She has a rolling walker in the home and states she uses it "sometimes". Writer encouraged patient to use the walker as a  safety precaution until she is healed. And, to especially use it if she uses the Diazepam or Oxycodone-Acetaminophen as coordination and alertness may be altered using the medications. Patient agreed to do so.   Patient has family/friends cooking for her and states they help her "when I overdo it sometimes". Patient is able to manage the stairs in her home independently.   Patient denies numbness or tingling in her legs and states her incision is "looking good" since having the staples removed on 09/21/2018. Patient reports she is independent with ADL's . Patient denies purulent drainage from site, fever or any other symptoms.   Patient reported she was told by Ortho that she could shower after the staples were removed. She has not done so, choosing to just wash up, as she was unsure if she would cause problems to the incision site. Writer advised, that if she was told she could shower, to wash the rest of her body as she normally does, but to just allow the warm water to gently run over her lower back. No direct hard pressure of water, no floral soaps, no lotions or oils, no scrubbing and to gently pat the area dry. Patient verbalized her understanding.       Time spent on phone call with patient/caregiver? 15 minutes  Time spent  in chart review ahead/after call? 10 minutes    Mable Paris, RN  09/28/2018

## 2018-09-28 NOTE — Telephone Encounter (Signed)
Call from [facility] who advises Robin Green is being closed to nursing care today but that PT will stay open who will continue to monitor Robin Green's BP.    Notes to follow.

## 2018-09-28 NOTE — Telephone Encounter (Signed)
Sending to Dr.Ellis as FYI

## 2018-09-29 ENCOUNTER — Ambulatory Visit: Payer: Medicare (Managed Care) | Attending: Primary Care | Admitting: Primary Care

## 2018-09-29 ENCOUNTER — Encounter: Payer: Self-pay | Admitting: Primary Care

## 2018-09-29 VITALS — BP 134/76 | HR 75 | Ht 64.0 in | Wt 209.0 lb

## 2018-09-29 DIAGNOSIS — M47816 Spondylosis without myelopathy or radiculopathy, lumbar region: Secondary | ICD-10-CM

## 2018-09-29 DIAGNOSIS — E669 Obesity, unspecified: Secondary | ICD-10-CM

## 2018-09-29 DIAGNOSIS — E782 Mixed hyperlipidemia: Secondary | ICD-10-CM

## 2018-09-29 DIAGNOSIS — G4700 Insomnia, unspecified: Secondary | ICD-10-CM

## 2018-09-29 DIAGNOSIS — I10 Essential (primary) hypertension: Secondary | ICD-10-CM

## 2018-09-29 DIAGNOSIS — E119 Type 2 diabetes mellitus without complications: Secondary | ICD-10-CM

## 2018-09-29 MED ORDER — METFORMIN HCL 500 MG PO TABS *I*
500.0000 mg | ORAL_TABLET | Freq: Two times a day (BID) | ORAL | 5 refills | Status: DC
Start: 2018-09-29 — End: 2018-11-23

## 2018-09-29 NOTE — Patient Instructions (Signed)
Bilateral L4-L5 decompressive lumbar laminectomy with instrumented fusion done on September 13, 2018

## 2018-09-29 NOTE — Progress Notes (Signed)
[facility]     HISTORY OF PRESENT ILLNESS     Robin Green is a 62 y.o. female who presents for Diabetes; Follow-up; and Lab Results    Patient comes in for routine hospital follow-up as well as follow-up of chronic medical problems.  Patient underwent bilateral L4-L5 decompressive lumbar laminectomy with instrumented fusion on September 13, 2018.  Procedure was done by Dr. Maurer at [facility].  Patient was discharged with nursing and physical therapy.  Patient was admitted on September 13, 2018 and discharged home on September 16, 2018  Hospital course was unremarkable except for brief episode of nausea on postop day 2.  This is relieved with a dose of Zofran.  Patient was discharged home on diazepam 5 mg 3 times a day as needed for muscle spasms and oxycodone 5 mg 1-2 tablets every 6 hours as needed for pain.  Patient was seen by orthopedics on September 21, 2018 and the staples were normal and without difficulty.  Per note wound was healing well and patient was walking with a walker.  Patient states that she feels a lot better since his surgery and is doing well.    Patient also seen for follow-up of the following chronic medical problems  Diabetes-patient states that she received insulin while as an inpatient and after discharge she is on metformin.  Hypertension-no headache or vision problems  Obesity-has been unable to exercise due to recent back surgery  Insomnia-at baseline  Back pain-much improved since surgery      Review of Systems   Constitutional: Negative for chills, fever, malaise/fatigue and weight loss.   HENT: Positive for hearing loss.    Respiratory: Negative for cough, shortness of breath and wheezing.    Cardiovascular: Negative for chest pain and palpitations.   Gastrointestinal: Negative for abdominal pain, constipation and diarrhea.   Genitourinary: Negative for dysuria.   Musculoskeletal: Negative for back pain.   Skin: Negative for rash.   Neurological: Negative for dizziness and  headaches.   Psychiatric/Behavioral: Negative for depression. The patient is not nervous/anxious.        MEDICATIONS     Current Outpatient Medications   Medication Sig   • oxyCODONE-acetaminophen (PERCOCET) 5-325 MG per tablet Take 1-2 tablets by mouth every 6 hours as needed   • diazePAM (VALIUM) 5 MG tablet Take 5 mg by mouth every 8 hours as needed   • magnesium gluconate (MAG-G) 500 MG tablet Take 500 mg by mouth daily   • TURMERIC CURCUMIN PO Take 1,000 mg by mouth daily   • hydrochlorothiazide (MICROZIDE) 12.5 MG capsule Take 2 capsules (25 mg total) by mouth every morning   • verapamil (CALAN-SR) 240 MG CR tablet Take 1 tablet (240 mg total) by mouth nightly   Swallow whole. Do not crush, break, or chew.   • blood glucose monitor (FREESTYLE FREEDOM LITE) kit Use daily for BG testing   • blood glucose test strip Test 3-4 times a day. Freestyle Freedom. Dx: DM-2   • Non-System Medication Abbott freestyle lifestyle touch dx: DM-2   • Non-System Medication BP cuff, large Dx: HTN   • POTASSIUM PO Take 595 mg by mouth daily   • cholecalciferol (VITAMIN D) 1000 UNIT tablet Take 2,000 Units by mouth daily      • metFORMIN (GLUCOPHAGE) 500 MG tablet Take 1 tablet (500 mg total) by mouth 2 times daily (with meals)   • TURMERIC PO Take 1 tablet by mouth daily   •   ALPRAZolam (XANAX) 0.25 MG tablet Take 1 tablet (0.25 mg total) by mouth 2 times daily as needed for Anxiety Max daily dose: 0.5 mg    nystatin (MYCOSTATIN) ointment Apply topically 2 times daily as needed 30GM Use topically twice daily on affected area[s]    Melatonin 3 MG CAPS Take 3 mg by mouth nightly as needed    penicillin v potassium (VEETIDS) 500 MG tablet TAKE 4 TABLETS ONE HOUR BEFORE APPT AS DIRECTED    amoxicillin (AMOXIL) 500 MG capsule Prior to dental procedures       Patient's medications were reviewed and updated at today's visit and no changes were made    ALLERGIES     Seafood and Lisinopril      Patient's allergies were reviewed and  updated at today's visit and no changes were made    PROBLEM LIST     Patient Active Problem List   Diagnosis Code    Sudden hearing loss H91.20    Obesity (BMI 30-39.9) E66.9    Type 2 diabetes mellitus E11.9    Carpal tunnel syndrome G56.00    HTN (hypertension) I10    Osteoarthritis, knee M17.10    Presence of right artificial knee joint Z96.651    Hyperlipidemia, mixed E78.2    Chronic left-sided low back pain with left-sided sciatica M54.42, G89.29    Lumbar spondylosis M47.816         TOBACCO USE HISTORY     Social History     Tobacco Use   Smoking Status Never Smoker   Smokeless Tobacco Never Used       PHYSICAL EXAM     BP 134/76 (BP Location: Left arm, Patient Position: Sitting, Cuff Size: large adult)    Pulse 75    Ht 1.626 m (5' 4")    Wt 94.8 kg (209 lb) Comment: shoe   SpO2 97%    BMI 35.87 kg/m         Physical Exam   Constitutional: She is oriented to person, place, and time.   Very pleasant African-American lady in no distress   HENT:   Head: Normocephalic and atraumatic.   Right Ear: External ear normal.   Left Ear: External ear normal.   Nose: Nose normal.   Mouth/Throat: No oropharyngeal exudate.   Eyes: Right eye exhibits no discharge. Left eye exhibits no discharge. No scleral icterus.   Cardiovascular: Normal rate, regular rhythm and normal heart sounds.   Pulmonary/Chest: Effort normal and breath sounds normal. No respiratory distress. She has no wheezes.   Musculoskeletal:      Lumbar back: She exhibits no tenderness and no swelling.        Back:       Comments: Normal bilateral pedal pulses.  Normal monofilament exam bilaterally   Neurological: She is alert and oriented to person, place, and time.   Skin: Skin is warm and dry.   Psychiatric: Affect normal. Her mood appears not anxious. She does not exhibit a depressed mood.         RECENT LABS     Recent Results (from the past 336 hour(s))   Lipid add Rfx to Drt LDL if Trig >400    Collection Time: 09/27/18 12:33 PM   Result  Value Ref Range    Cholesterol 169 mg/dL    Triglycerides 56 mg/dL    HDL 73 (H) 40 - 60 mg/dL    LDL Calculated 85 mg/dL    Non HDL Cholesterol 96 mg/dL  Chol/HDL Ratio 2.3    Comprehensive metabolic panel    Collection Time: 09/27/18 12:33 PM   Result Value Ref Range    Sodium 139 133 - 145 mmol/L    Potassium 4.5 3.3 - 5.1 mmol/L    Chloride 101 96 - 108 mmol/L    CO2 29 (H) 20 - 28 mmol/L    Anion Gap 9 7 - 16    UN 12 6 - 20 mg/dL    Creatinine 0.94 0.51 - 0.95 mg/dL    GFR,Caucasian 65 *    GFR,Black 75 *    Glucose 118 (H) 60 - 99 mg/dL    Calcium 9.7 8.6 - 10.2 mg/dL    Total Protein 7.1 6.3 - 7.7 g/dL    Albumin 3.8 3.5 - 5.2 g/dL    Bilirubin,Total 0.2 0.0 - 1.2 mg/dL    AST 23 0 - 35 U/L    ALT 21 0 - 35 U/L    Alk Phos 60 35 - 105 U/L   Hemoglobin A1c    Collection Time: 09/27/18 12:33 PM   Result Value Ref Range    Hemoglobin A1C 7.0 (H) %            ASSESSMENT/PLAN     1. Diabetes  Follow a low carbohydrate diet.  Continue metformin at current dosage.  Check labs as ordered.  Up-to-date on foot care.  Up-to-date on immunizations except shingles vaccination.  Patient will check coverage of the same.  Due for eye exam.  She will call to make an appointment.  Wi'll slowly increase activity as tolerated  - Comprehensive metabolic panel; Future  - Hemoglobin A1c; Future  - Lipid Panel (Reflex to Direct  LDL if Triglycerides more than 400); Future  - Microalbumin, Urine, Random; Future    2. Essential hypertension  Follow a low-salt diet and exercise as much as tolerated.  Continue hydrochlorothiazide 25 mg a day.  Continue verapamil 240 mg once a day.  Work on weight loss when she is able to exercise more.  Blood pressure goal of 130/80 or less.  Reassess at next office visit.    3. Hyperlipidemia, mixed  ASCVD 10 year risk score is at 16.2%.  I recommended statins.  Patient is well aware of risks for heart attack and stroke but wants to aggressively manage diet and exercise.  Will recheck labs as  ordered.  Encouraged to follow a mostly plant-based diet.    4. Obesity (BMI 30-39.9)  Continue to work on weight loss.    5. Insomnia, unspecified type  Continue melatonin as needed.    6. Lumbar spondylosis  Reviewed recent hospitalization and follow-up office visits with neurosurgery.  We'll be weaning off oxycodone and diazepam.  Patient states she is only taking 1 pill of the oxycodone that was given to her.  She will call with any questions or concerns.    Time-based visit of 45 minutes with more than 50% of the visit spent in face-to-face counseling    Return in about 3 months (around 12/29/2018) for Follow-up, diabetes.    --Patient instructed to call if symptoms are not improving or worsening  --Follow-up arranged    Signed:  , MD on 09/30/2018 at 2:23 PM

## 2018-09-30 ENCOUNTER — Encounter: Payer: Self-pay | Admitting: Gastroenterology

## 2018-09-30 LAB — HM DIABETES FOOT EXAM

## 2018-10-10 ENCOUNTER — Encounter: Payer: Self-pay | Admitting: Gastroenterology

## 2018-10-11 ENCOUNTER — Encounter: Payer: Self-pay | Admitting: Gastroenterology

## 2018-10-13 ENCOUNTER — Encounter: Payer: Self-pay | Admitting: Gastroenterology

## 2018-11-09 ENCOUNTER — Encounter: Payer: Self-pay | Admitting: Gastroenterology

## 2018-11-17 ENCOUNTER — Other Ambulatory Visit: Payer: Self-pay | Admitting: Primary Care

## 2018-11-17 NOTE — Telephone Encounter (Signed)
MM updated, pt last seen 09/29/18, next OV 12/29/18

## 2018-11-23 ENCOUNTER — Other Ambulatory Visit: Payer: Self-pay | Admitting: Primary Care

## 2018-11-23 MED ORDER — METFORMIN HCL 500 MG PO TABS *I*
500.0000 mg | ORAL_TABLET | Freq: Two times a day (BID) | ORAL | 1 refills | Status: DC
Start: 2018-11-23 — End: 2020-07-22

## 2018-11-23 NOTE — Telephone Encounter (Signed)
1 in mm Last Ov 09/29/2018 next appt 12/29/2018 Please ERX

## 2018-12-06 ENCOUNTER — Ambulatory Visit: Payer: Medicare (Managed Care) | Admitting: Foot & Ankle Surgery

## 2018-12-23 ENCOUNTER — Telehealth: Payer: Self-pay | Admitting: Primary Care

## 2018-12-23 ENCOUNTER — Encounter: Payer: Self-pay | Admitting: Gastroenterology

## 2018-12-23 NOTE — Telephone Encounter (Signed)
Zoom information updated and provided to patient and provider

## 2018-12-23 NOTE — Telephone Encounter (Signed)
VV (link) needed 4/30 @[phone]/  3 month fu for diabetes.

## 2018-12-26 ENCOUNTER — Other Ambulatory Visit
Admission: RE | Admit: 2018-12-26 | Discharge: 2018-12-26 | Disposition: A | Discharge status: Home / Self Care | Payer: Medicare (Managed Care) | Source: Ambulatory Visit | Attending: Primary Care | Admitting: Primary Care

## 2018-12-26 DIAGNOSIS — E119 Type 2 diabetes mellitus without complications: Secondary | ICD-10-CM | POA: Insufficient documentation

## 2018-12-26 LAB — COMPREHENSIVE METABOLIC PANEL
ALT: 20 U/L (ref 0–35)
AST: 20 U/L (ref 0–35)
Albumin: 4.3 g/dL (ref 3.5–5.2)
Alk Phos: 59 U/L (ref 35–105)
Anion Gap: 12 (ref 7–16)
Bilirubin,Total: 0.4 mg/dL (ref 0.0–1.2)
CO2: 28 mmol/L (ref 20–28)
Calcium: 9.4 mg/dL (ref 8.6–10.2)
Chloride: 98 mmol/L (ref 96–108)
Creatinine: 0.88 mg/dL (ref 0.51–0.95)
GFR,Black: 81 *
GFR,Caucasian: 70 *
Glucose: 124 mg/dL — ABNORMAL HIGH (ref 60–99)
Lab: 13 mg/dL (ref 6–20)
Potassium: 4.2 mmol/L (ref 3.3–5.1)
Sodium: 138 mmol/L (ref 133–145)
Total Protein: 7.3 g/dL (ref 6.3–7.7)

## 2018-12-26 LAB — MICROALBUMIN, URINE, RANDOM
Creatinine,UR: 148 mg/dL (ref 20–300)
Microalbumin,UR: <1.2 mg/dL

## 2018-12-26 LAB — LIPID PANEL
Chol/HDL Ratio: 2.2
Cholesterol: 196 mg/dL
HDL: 89 mg/dL — ABNORMAL HIGH (ref 40–60)
LDL Calculated: 95 mg/dL
Non HDL Cholesterol: 107 mg/dL
Triglycerides: 60 mg/dL

## 2018-12-26 LAB — HEMOGLOBIN A1C: Hemoglobin A1C: 6.6 % — ABNORMAL HIGH

## 2018-12-29 ENCOUNTER — Ambulatory Visit: Payer: Medicare (Managed Care) | Admitting: Primary Care

## 2018-12-29 ENCOUNTER — Other Ambulatory Visit: Payer: Self-pay | Admitting: Primary Care

## 2018-12-29 ENCOUNTER — Encounter: Payer: Self-pay | Admitting: Primary Care

## 2018-12-29 DIAGNOSIS — I10 Essential (primary) hypertension: Secondary | ICD-10-CM

## 2018-12-29 DIAGNOSIS — Z0000 Encounter for general adult medical examination without abnormal findings: Secondary | ICD-10-CM

## 2018-12-29 DIAGNOSIS — E669 Obesity, unspecified: Secondary | ICD-10-CM

## 2018-12-29 DIAGNOSIS — E782 Mixed hyperlipidemia: Secondary | ICD-10-CM

## 2018-12-29 DIAGNOSIS — G4700 Insomnia, unspecified: Secondary | ICD-10-CM

## 2018-12-29 DIAGNOSIS — E119 Type 2 diabetes mellitus without complications: Secondary | ICD-10-CM

## 2018-12-29 MED ORDER — GLUCOSE BLOOD VI STRP *A*
ORAL_STRIP | Freq: Four times a day (QID) | 5 refills | Status: DC
Start: 2018-12-29 — End: 2019-03-23

## 2018-12-29 NOTE — Progress Notes (Signed)
Due to pandemic event, visit performed via:        Telephone     This is an established patient visit.    Reason for visit: Subsequent Annual Medicare Visit and Lab Results      HPI:  Patient is also seen for follow-up of her chronic medical problems in addition to subsequent Medicare wellness.  States that sugars are well controlled.  She has been careful with her diet.  Has been unable to check her blood pressures at home.  She is trying to find a cough and wants to monitor it.  No chest pain.  Had blood work done recently and those look improved compared to her prior blood work.  Trying to exercise and stay positive and stay healthy.  Saw neurosurgery for follow-up after back surgery.  Was in physical therapy and stopped doing that as that was making her concerned as there were too many people in the office    Patient's problem list, allergies, and medications were reviewed and updated as appropriate. Please see the EHR for full details.    Physical Exam:    This visit was performed during a pandemic event, thus the physical exam was not performed.    Assessment Plan:  1. Preventative health care  Up-to-date on routine preventative care.  Due for eye exam and she will follow-up ones were back to normal functioning    2. Diabetes  A1c is well controlled.  Continue metformin at current dosage.  Follow a low carbohydrate diet and exercise 30 minutes most days of the week or as much as possible.  Continue to work on weight loss.  Recheck blood work in 4 months.  - Hemoglobin A1c; Future  - Comprehensive metabolic panel; Future  - Lipid Panel (Reflex to Direct  LDL if Triglycerides more than 400); Future  - Microalbumin, Urine, Random; Future    3. Essential hypertension  Continue verapamil and hydrochlorothiazide at current dosage.  Follow a low-salt diet.  Exercise 30 minutes most days of the week or as much as possible.  Continue to work on weight loss.  Goal blood pressure 130/80 or less    4. Hyperlipidemia,  mixed  Follow a low-cholesterol diet.  Exercise 30 minutes most days of the week or as much as possible.  Continue to work on weight loss.  Lipid panel is pretty well controlled     5. Obesity (BMI 30-39.9)  continue to work on weight loss    6. Insomnia, unspecified type  Supportive care.  Continue melatonin as needed      The plan was discussed with the patient and the patient/patient rep demonstrated understanding to the provider's satisfaction.    Consent was previously obtained from the patient to complete this telephone consult; including the potential for financial liability.    11-20 minutes were spent on the phone with the patient, patient representatives, and/or other attendees.               Hetty Ely, MD

## 2018-12-29 NOTE — Patient Instructions (Signed)
Thank you for completing your Subsequent Annual Medicare Visit and Lab Results   with [place] today.     The purpose of this visits was to:     Screen for disease   Assess risk of future medical problems   Help develop a healthy lifestyle   Update vaccines   Get to know your doctor in case of an illness    Patient Care Team:  Hetty Ely, MD as PCP - General (Primary Care)  Verita Schneiders, AUD as PCP - CCP-AUDIOLOGY     Medicare 5 Year Plan    The following items were identified as areas of concern during your screening today:      The Health Maintenance table below identifies screening tests and immunizations recommended by your health care team:  Health Maintenance   Topic Date Due    IMM-ZOSTER (2 of 3) 11/08/2012    DEPRESSION SCREEN YEARLY  03/17/2018    OPHTHALMOLOGY EXAM  06/16/2018    IMM-INFLUENZA (Season Ended) 05/02/2019    HEMOGLOBIN A1C  06/27/2019    FOOT EXAM  10/01/2019    URINE MICROALBUMIN  12/26/2019    LIPID DISORDER SCREENING  12/26/2019    Cervical Cancer Screening  03/30/2020    Breast Cancer Screening Other  06/09/2020    Colon Cancer Screening Other  06/26/2021    HIV TESTING OFFERED  Completed    HEPATITIS C SCREENING OFFERED  Completed     In addition, goals and orders placed to address these recommendations are listed in the "Today's Visit" section.    We wish you the best of health and look forward to seeing you again next year for your Annual Medicare Wellness Visit.     If you have any health care concerns before then, please do not hesitate to contact us.

## 2018-12-29 NOTE — Progress Notes (Signed)
Due to pandemic event, telehome visit preformed via:             Telephone for [employer]     Location of Patient: home  Location of Telemedicine Provider: home office   Other participants in telemedicine encounter and roles: n/a    Consent was obtained from the patient to complete this telephone visit; including the potential for financial liability.    This visit was performed during a pandemic event and the vital signs including BMI and physical exam are limited or missing due to the patient's location.  _____________________________________________________________________    Today we reviewed and updated Robin Green smoking status, activities of daily living, depression screen, fall risk, medications and allergies.   I have counseled the patient in the above areas.     Subjective:     Chief Complaint: Robin Green is a 63 y.o. female here for a/an Subsequent Annual Medicare Visit and Lab Results    In general, Robin Green rates their overall health as:  good      Patient Care Team:  Earna Coder, MD as PCP - General (Primary Care)  Noah Delaine, AUD as PCP - CCP-AUDIOLOGY     Current Outpatient Medications on File Prior to Visit   Medication Sig Dispense Refill    metFORMIN (GLUCOPHAGE) 500 MG tablet Take 1 tablet (500 mg total) by mouth 2 times daily (with meals) 180 tablet 1    verapamil (CALAN-SR) 240 MG CR tablet Take 1 tablet (240 mg total) by mouth nightly 90 tablet 1    TURMERIC PO Take 1 tablet by mouth daily      magnesium gluconate (MAG-G) 500 MG tablet Take 500 mg by mouth daily      hydrochlorothiazide (MICROZIDE) 12.5 MG capsule Take 2 capsules (25 mg total) by mouth every morning 180 capsule 1    blood glucose monitor (FREESTYLE FREEDOM LITE) kit Use daily for BG testing      Non-System Medication Abbott freestyle lifestyle touch dx: DM-2      Non-System Medication BP cuff, large Dx: HTN      POTASSIUM PO Take 595 mg by mouth daily       cholecalciferol (VITAMIN D) 1000 UNIT tablet Take 2,000 Units by mouth daily         oxyCODONE-acetaminophen (PERCOCET) 5-325 MG per tablet Take 1-2 tablets by mouth every 6 hours as needed      ALPRAZolam (XANAX) 0.25 MG tablet Take 1 tablet (0.25 mg total) by mouth 2 times daily as needed for Anxiety Max daily dose: 0.5 mg 60 tablet 0    nystatin (MYCOSTATIN) ointment Apply topically 2 times daily as needed 30GM Use topically twice daily on affected area[s] 30 g 2    Melatonin 3 MG CAPS Take 3 mg by mouth nightly as needed      penicillin v potassium (VEETIDS) 500 MG tablet TAKE 4 TABLETS ONE HOUR BEFORE APPT AS DIRECTED      amoxicillin (AMOXIL) 500 MG capsule Prior to dental procedures       No current facility-administered medications on file prior to visit.      Allergies   Allergen Reactions    Seafood Itching    Lisinopril Other (See Comments)     Dizziness and cough      Patient Active Problem List    Diagnosis Date Noted    Lumbar spondylosis 09/29/2018    Chronic left-sided low back pain with left-sided sciatica 12/16/2017  Hyperlipidemia, mixed 03/17/2017    Sudden hearing loss 02/25/2017    Obesity (BMI 30-39.9) 02/25/2017    Type 2 diabetes mellitus 02/25/2017    Carpal tunnel syndrome 02/25/2017    HTN (hypertension) 02/25/2017    Osteoarthritis, knee 02/25/2017    Presence of right artificial knee joint 12/25/2015     Past Medical History:   Diagnosis Date    Anxiety     Diabetes     Diabetes mellitus     High blood pressure     Hypertension      Past Surgical History:   Procedure Laterality Date    BREAST BIOPSY  2008    CHOLECYSTECTOMY, LAPAROSCOPIC      JOINT REPLACEMENT      left TKR    KNEE REPLACEMENT  2008/2017    x2     Family History   Problem Relation Age of Onset    Breast cancer Mother     Diabetes Sister     Hypertension Sister     Diabetes Maternal Uncle     Coronary artery disease Maternal Uncle     Prostate cancer Father     Dementia Father       Social History     Socioeconomic History    Marital status: Widowed     Spouse name: Not on file    Number of children: Not on file    Years of education: Not on file    Highest education level: Not on file   Occupational History    Not on file   Tobacco Use    Smoking status: Never Smoker    Smokeless tobacco: Never Used   Substance and Sexual Activity    Alcohol use: No    Drug use: No    Sexual activity: Not Currently   Social History Narrative    Not on file       Objective:     Vital Signs: There were no vitals taken for this visit.   BMI: There is no height or weight on file to calculate BMI.    Vision Screening Results (Welcome visit only):  No exam data present    Depression Screening Results:  Recent Review Flowsheet Data     PHQ Scores 12/29/2018 03/17/2017    PSQ2 Q1 - Interest/Pleasure N N    PSQ2 Q2 - Down, Depressed, Hopeless N N        Opioid Use/DAST- 10 Screening Results:   How many times in the past year have you used an illegal drug or used a prescription medication for nonmedical reasons?: 0 (12/29/2018  1:58 PM)    Activities of Daily Living/Functional Screening Results:  Is the person deaf or does he/she have serious difficulty hearing?: N  Is this person blind or does he/she have serious difficulty seeing even when wearing glasses?: N  *Vision Status: Visual aid   Does this person have serious difficulty walking or climbing stairs?: N  Does this person have difficulty dressing or bathing?: N  *Shopping: Independent  *House Keeping: Independent  *Managing Own Medications: Independent  *Handling Finances: Independent  Difficulty doing errands due to a physicial, mental or emotional condition: No  Difficulty remembering or making decisions due to a physicial, mental or emotional condition: No      Fall Risk Screening Results:  Have you fallen in the last year?: No  Do you feel you are at risk for falling?: No      Assessment and Plan:  Cognitive Function:  Recall of recent and  remote events appears:  Normal      Advanced Care Planning:  was discussed and patient received paperwork to review     The following health maintenance plan was reviewed with the patient:    Health Maintenance Topics with due status: Overdue       Topic Date Due    IMM-ZOSTER 11/08/2012    DEPRESSION SCREEN YEARLY 03/17/2018    OPHTHALMOLOGY EXAM 06/16/2018     Health Maintenance Topics with due status: Not Due       Topic Last Completion Date    IMM-INFLUENZA 06/15/2016    Colon Cancer Screening Other 06/26/2016    Cervical Cancer Screening 03/30/2017    Breast Cancer Screening Other 06/09/2018    FOOT EXAM 09/30/2018    HEMOGLOBIN A1C 12/26/2018    URINE MICROALBUMIN 12/26/2018    LIPID DISORDER SCREENING 12/26/2018     Health Maintenance Topics with due status: Completed       Topic Last Completion Date    HEPATITIS C SCREENING OFFERED 06/10/2016    HIV TESTING OFFERED 03/17/2017     This health maintenance schedule, identified risks, a list of orders placed today and patient goals have been provided to Robin Green in the after visit summary.     Plan for any concerns identified during screening or risk assessments:  n/a

## 2018-12-29 NOTE — Telephone Encounter (Signed)
Last appointment: 09/29/2018   Next appointment: Visit date not found

## 2019-01-17 ENCOUNTER — Other Ambulatory Visit: Payer: Self-pay | Admitting: Primary Care

## 2019-01-17 MED ORDER — HYDROCHLOROTHIAZIDE 12.5 MG PO CAPS *I*
25.0000 mg | ORAL_CAPSULE | Freq: Every morning | ORAL | 1 refills | Status: DC
Start: 2019-01-17 — End: 2019-06-30

## 2019-01-17 NOTE — Telephone Encounter (Signed)
Scheduled : 12/29/2019. Last OV : 12/29/18 . Refill pended .

## 2019-01-26 ENCOUNTER — Other Ambulatory Visit: Payer: Self-pay | Admitting: Gastroenterology

## 2019-02-06 ENCOUNTER — Encounter: Payer: Self-pay | Admitting: Gastroenterology

## 2019-02-23 ENCOUNTER — Telehealth: Payer: Self-pay | Admitting: Primary Care

## 2019-02-23 ENCOUNTER — Other Ambulatory Visit: Payer: Self-pay | Admitting: Primary Care

## 2019-02-23 NOTE — Telephone Encounter (Signed)
Please give her an in office visit in October.  Not sure why that appointment was canceled

## 2019-02-23 NOTE — Telephone Encounter (Signed)
[facility] to schedule

## 2019-02-23 NOTE — Telephone Encounter (Signed)
Pt call to made appt for med check in October, but she will like another appt before Oct to check her blood pressure please advise [phone] (

## 2019-02-23 NOTE — Telephone Encounter (Signed)
Last appointment: 09/29/2018   Next appointment: Visit date not found

## 2019-02-23 NOTE — Telephone Encounter (Signed)
Can offer any time now for in office visit for blood pressure check since patient is requesting it

## 2019-02-23 NOTE — Telephone Encounter (Signed)
Please book for medication review per Dr Lissa Merlin request

## 2019-02-23 NOTE — Telephone Encounter (Signed)
Patient need appointment to get scripts refilled she wants an in office appointment  Please advise

## 2019-02-23 NOTE — Telephone Encounter (Signed)
Dr Lissa Merlin , you saw pt for a telemed phone visit  On 12/29/18 ( [place]) your note said follow up in 4 months. She would like to be seen sooner the October. When would you like to see pt?

## 2019-02-23 NOTE — Telephone Encounter (Signed)
appt in place

## 2019-03-02 NOTE — Telephone Encounter (Signed)
Appt in place

## 2019-03-22 ENCOUNTER — Encounter: Payer: Self-pay | Admitting: Primary Care

## 2019-03-22 NOTE — Progress Notes (Signed)
[place]     Robin Green is a 63 y.o. female who presents for Hypertension; left shoulder pain; and voice fading  Patient seen for follow-up of the following chronic medical problems  Hypertension-has not been taking her hydrochlorothiazide pills and has not taken them today  Diabetes-no changes  Hyperlipidemia-no chest pain  Hearing loss-at baseline  Obesity-no changes in her weight  She has been noticing that she has been losing her voice a lot more lately.  Also left shoulder has been hurting a little it not too bad.  She is supposed to go to physical therapy but does not want to go in person and is going to try to do some exercises at home        Review of Systems   Constitutional: Negative for chills, fever and weight loss.   HENT:        Voice change   Respiratory: Negative for cough, shortness of breath and wheezing.    Cardiovascular: Negative for chest pain and palpitations.   Musculoskeletal: Positive for joint pain.       MEDICATIONS     Current Outpatient Medications   Medication Sig    blood glucose test strip By no specified route 4 times daily Test 3-4 times a day. Freestyle Freedom. Dx: DM-2    hydroCHLOROthiazide (MICROZIDE) 12.5 MG capsule Take 2 capsules (25 mg total) by mouth every morning    metFORMIN (GLUCOPHAGE) 500 MG tablet Take 1 tablet (500 mg total) by mouth 2 times daily (with meals)    verapamil (CALAN-SR) 240 MG CR tablet Take 1 tablet (240 mg total) by mouth nightly    TURMERIC PO Take 1 tablet by mouth daily    magnesium gluconate (MAG-G) 500 MG tablet Take 500 mg by mouth daily    penicillin v potassium (VEETIDS) 500 MG tablet TAKE 4 TABLETS ONE HOUR BEFORE APPT AS DIRECTED    blood glucose monitor (FREESTYLE FREEDOM LITE) kit Use daily for BG testing    Non-System Medication Abbott freestyle lifestyle touch dx: DM-2    Non-System Medication BP cuff, large Dx: HTN    POTASSIUM PO Take 595 mg by mouth daily    cholecalciferol  (VITAMIN D) 1000 UNIT tablet Take 2,000 Units by mouth daily       ALPRAZolam (XANAX) 0.25 MG tablet Take 1 tablet (0.25 mg total) by mouth 2 times daily as needed for Anxiety Max daily dose: 0.5 mg    nystatin (MYCOSTATIN) ointment Apply topically 2 times daily as needed 30GM Use topically twice daily on affected area[s]    Melatonin 3 MG CAPS Take 3 mg by mouth nightly as needed    amoxicillin (AMOXIL) 500 MG capsule Prior to dental procedures       Patient's medications were reviewed and updated at today's visit and no changes were made    ALLERGIES     Seafood and Lisinopril      Patient's allergies were reviewed and updated at today's visit and no changes were made    PROBLEM LIST     Patient Active Problem List   Diagnosis Code    Sudden hearing loss H91.20    Obesity (BMI 30-39.9) E66.9    Type 2 diabetes mellitus E11.9    Carpal tunnel syndrome G56.00    HTN (hypertension) I10    Osteoarthritis, knee M17.10    Presence of right artificial knee joint Z96.651    Hyperlipidemia, mixed E78.2  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   Right Ear: External ear normal.   Left Ear: External ear normal.   Nose: Nose normal.   Eyes: Right eye exhibits no discharge. Left eye exhibits no discharge. No scleral icterus.   Cardiovascular: Normal rate, regular rhythm and normal heart sounds.   No murmur heard.  Pulmonary/Chest: Effort normal and breath sounds normal. No respiratory distress. She has no wheezes.   Musculoskeletal:      Left shoulder: She exhibits  tenderness.   Psychiatric: Affect normal.   Vitals reviewed.        RECENT LABS   No results found for this or any previous visit (from the past 336 hour(s)).         ASSESSMENT/PLAN     1. Type 2 diabetes mellitus  Recommend low carb diet.  Continue to work on weight loss.  Continue Metformin at current dosage exercise 30 minutes most days of the week.  Check labs as ordered.  Up-to-date on eye exam.  Awaiting records  2. HTN (hypertension)  Blood pressure is not at goal today.  However patient has taken her verapamil but not the hydrochlorothiazide.  Urged to take hydrochlorothiazide as prescribed.  I discussed with patient if she would like me to switch it to something else and she does not want to do that either.  Recommend low-salt diet.  Restrict sodium intake to less than 2 g/day.  Continue to work on weight loss.  Goal blood pressure 130/90 or less.    3. Hyperlipidemia, mixed  Follow low-cholesterol diet and exercise 30 minutes most days of the week.  Continue to work on weight loss.    4. Sudden hearing loss  Followed by ENT    5. Obesity (BMI 30-39.9)  Continue to work on weight loss    6. Change in voice  Check thyroid labs as ordered.  Patient aware to follow-up with ENT for evaluation of vocal cords  - TSH; Future  - T4, free; Future    7. Shoulder pain, left  Recommend physical therapy.  Call with any worsening or if no better.  Patient seems to have good range of movement    Return in about 4 months (around 07/24/2019) for Yearly Check-up.    --Patient instructed to call if symptoms are not improving or worsening  --Follow-up arranged    Signed: Earna Coder, MD on 03/23/2019 at 12:47 PM

## 2019-03-23 ENCOUNTER — Other Ambulatory Visit
Admission: RE | Admit: 2019-03-23 | Discharge: 2019-03-23 | Disposition: A | Discharge status: Home / Self Care | Payer: Medicare (Managed Care) | Source: Ambulatory Visit | Attending: Primary Care | Admitting: Primary Care

## 2019-03-23 ENCOUNTER — Other Ambulatory Visit: Payer: Self-pay | Admitting: Gastroenterology

## 2019-03-23 ENCOUNTER — Ambulatory Visit: Payer: Medicare (Managed Care) | Admitting: Primary Care

## 2019-03-23 ENCOUNTER — Other Ambulatory Visit: Payer: Self-pay | Admitting: Primary Care

## 2019-03-23 ENCOUNTER — Telehealth: Payer: Self-pay | Admitting: Primary Care

## 2019-03-23 ENCOUNTER — Encounter: Payer: Self-pay | Admitting: Primary Care

## 2019-03-23 VITALS — BP 142/68 | HR 68 | Ht 64.0 in | Wt 208.0 lb

## 2019-03-23 DIAGNOSIS — E119 Type 2 diabetes mellitus without complications: Secondary | ICD-10-CM

## 2019-03-23 DIAGNOSIS — M25512 Pain in left shoulder: Secondary | ICD-10-CM

## 2019-03-23 DIAGNOSIS — I10 Essential (primary) hypertension: Secondary | ICD-10-CM

## 2019-03-23 DIAGNOSIS — R499 Unspecified voice and resonance disorder: Secondary | ICD-10-CM

## 2019-03-23 DIAGNOSIS — E669 Obesity, unspecified: Secondary | ICD-10-CM

## 2019-03-23 DIAGNOSIS — E782 Mixed hyperlipidemia: Secondary | ICD-10-CM

## 2019-03-23 DIAGNOSIS — H9120 Sudden idiopathic hearing loss, unspecified ear: Secondary | ICD-10-CM

## 2019-03-23 LAB — TSH: TSH: 0.79 u[IU]/mL (ref 0.27–4.20)

## 2019-03-23 LAB — T4, FREE: Free T4: 1.2 ng/dL (ref 0.9–1.7)

## 2019-03-23 MED ORDER — GLUCOSE BLOOD VI STRP *A*
ORAL_STRIP | Freq: Four times a day (QID) | 5 refills | Status: DC
Start: 2019-03-23 — End: 2020-10-24

## 2019-03-23 NOTE — Telephone Encounter (Signed)
Checkout incomplete: LVM to schedule Medicare wellness visit (due around 07/24/2019)

## 2019-03-23 NOTE — Telephone Encounter (Signed)
Last appointment: 03/23/2019  Next appointment: 03/23/2019

## 2019-03-23 NOTE — Progress Notes (Signed)
Her voice keeps fading in and out

## 2019-03-24 ENCOUNTER — Encounter: Payer: Self-pay | Admitting: Gastroenterology

## 2019-03-24 ENCOUNTER — Telehealth: Payer: Self-pay | Admitting: Primary Care

## 2019-03-24 LAB — HM DIABETES EYE EXAM

## 2019-03-24 NOTE — Telephone Encounter (Signed)
Attempted to call pt,message left for pt that I sent a my chart message , any questions please let us know.

## 2019-03-24 NOTE — Telephone Encounter (Signed)
Check out complete

## 2019-03-24 NOTE — Telephone Encounter (Signed)
Called [facility] provided Fax number to have them send notes for pt to Dr Lissa Merlin

## 2019-03-24 NOTE — Telephone Encounter (Signed)
Pt called back. Gave her information per Dr Lissa Merlin, pt verbalized understanding

## 2019-03-24 NOTE — Telephone Encounter (Signed)
-----   Message from Earna Coder, MD sent at 03/23/2019  9:17 PM EDT -----  Thyroid levels are normal. Please notify pt

## 2019-03-24 NOTE — Telephone Encounter (Signed)
-----   Message from Earna Coder, MD sent at 03/23/2019 11:38 AM EDT -----  [place] [facility] [place] [address] Please get report for my review    JE

## 2019-03-29 ENCOUNTER — Other Ambulatory Visit: Payer: Self-pay | Admitting: Primary Care

## 2019-03-29 MED ORDER — VERAPAMIL HCL CR 240 MG PO TBCR *I*
240.0000 mg | ORAL_TABLET | Freq: Every evening | ORAL | 1 refills | Status: DC
Start: 2019-03-29 — End: 2019-03-29

## 2019-03-29 NOTE — Telephone Encounter (Signed)
Last appointment: 03/23/2019   Next appointment: 06/30/2019

## 2019-03-29 NOTE — Telephone Encounter (Signed)
Last appointment: 03/23/2019  Next appointment: 06/30/2019    Mm updated

## 2019-03-30 ENCOUNTER — Other Ambulatory Visit: Payer: Self-pay | Admitting: Primary Care

## 2019-03-30 MED ORDER — VERAPAMIL HCL CR 240 MG PO TBCR *I*
240.0000 mg | ORAL_TABLET | Freq: Every evening | ORAL | 1 refills | Status: DC
Start: 2019-03-30 — End: 2019-10-27

## 2019-03-30 MED ORDER — HYDROCHLOROTHIAZIDE 25 MG PO TABS *I*
25.0000 mg | ORAL_TABLET | Freq: Every morning | ORAL | 1 refills | Status: DC
Start: 2019-03-30 — End: 2019-06-30

## 2019-03-30 NOTE — Telephone Encounter (Signed)
Last appointment: 03/23/2019  Next appointment: 06/30/2019    Mm updated

## 2019-03-30 NOTE — Progress Notes (Signed)
Outside  diabetic eye exam

## 2019-03-30 NOTE — Telephone Encounter (Signed)
hydroCHLOROthiazide (MICROZIDE) 12.5 MG capsule     Pt is asking Instead of capsules can it be changed to tablets

## 2019-03-30 NOTE — Telephone Encounter (Signed)
Last appointment: 03/23/2019  Next appointment: 06/30/2019    Dr Lissa Merlin, pt is asking for tablets in place of capsule.Ok to write for 25 mg tablet  One a day or keep as 12.5 mg two every morning,

## 2019-03-30 NOTE — Telephone Encounter (Signed)
Switched to tablets and E scripted

## 2019-04-13 ENCOUNTER — Telehealth: Payer: Self-pay | Admitting: Primary Care

## 2019-04-13 NOTE — Telephone Encounter (Signed)
[facility] contacted for the second time to request a copy of last diabetic eye exam.  Fax sent to [phone].

## 2019-04-14 ENCOUNTER — Encounter: Payer: Self-pay | Admitting: Gastroenterology

## 2019-04-19 NOTE — Progress Notes (Signed)
Outside  Diabetic eye exam  . Please update

## 2019-04-20 ENCOUNTER — Ambulatory Visit: Payer: Medicare (Managed Care) | Admitting: Foot & Ankle Surgery

## 2019-04-20 ENCOUNTER — Encounter: Payer: Self-pay | Admitting: Foot & Ankle Surgery

## 2019-04-20 VITALS — Ht 64.0 in | Wt 208.0 lb

## 2019-04-20 DIAGNOSIS — E119 Type 2 diabetes mellitus without complications: Secondary | ICD-10-CM

## 2019-04-20 NOTE — Progress Notes (Signed)
SUBJECTIVE:    Chief Complaint   Patient presents with    Right Foot - Follow-up, Diabetes     DM X 5 years, BG today AM=100  Wearing sneakers    Left Foot - Follow-up, Diabetes     63 year old patient presents to clinic for diabetic foot check.  She's been diabetic for about 5 years.  Her blood glucose today was 100 mg/dL.  She denies any open cuts or sores.  She denies any redness, swelling, drainage, positive she denies any fevers, chills, nausea, vomiting.  She has no new complaints today.     PMH, PSH, Family, Social, Meds, see above  Allergy:   Allergies   Allergen Reactions    Seafood Itching    Lisinopril Other (See Comments)     Dizziness and cough         OBJECTIVE FINDINGS:     Patient is AO3, NAD  DP/PT 2/4 b/l, CFT <3 sec b/l  Sensation intact with SW 5.07g monofilament b/l  Motor fxn intact b/l  Muscle strength 5/5 b/l  Skin is warm, dry, supple.  There is no open lesions appreciated.  Nails are elongated, however within normal limits.  No scaling noted plantarly     No components found for: A1C     DIAGNOSIS:    Encounter Diagnoses   Name Primary?    Type 2 diabetes mellitus without complication, without long-term current use of insulin Yes    Encounter for diabetic foot exam                             PLAN:     - no clinically significant foot or ankle abnormalities  - optimize blood glucose control and blood pressure control and coordinated care and f/u visits with primary care provider  - detailed explanation of potential foot and ankle problems associated with diabetes  - Patient has been afforded the opportunity to ask questions. All questions answered. Patient verbalized understanding of all issues and instructions  - Debrided patient's nails without incident  - F/u yearly     Thanks for the referral Dr. Lissa Merlin

## 2019-04-25 ENCOUNTER — Other Ambulatory Visit
Admission: RE | Admit: 2019-04-25 | Discharge: 2019-04-25 | Disposition: A | Discharge status: Home / Self Care | Payer: Medicare (Managed Care) | Source: Ambulatory Visit | Attending: Pathology | Admitting: Pathology

## 2019-04-25 DIAGNOSIS — N858 Other specified noninflammatory disorders of uterus: Secondary | ICD-10-CM

## 2019-04-25 DIAGNOSIS — N841 Polyp of cervix uteri: Secondary | ICD-10-CM | POA: Insufficient documentation

## 2019-05-04 LAB — SURGICAL PATHOLOGY

## 2019-05-11 ENCOUNTER — Encounter: Payer: Self-pay | Admitting: Gastroenterology

## 2019-06-09 ENCOUNTER — Other Ambulatory Visit
Admission: RE | Admit: 2019-06-09 | Discharge: 2019-06-09 | Disposition: A | Discharge status: Home / Self Care | Payer: Medicare (Managed Care) | Source: Ambulatory Visit | Attending: Pathology | Admitting: Pathology

## 2019-06-09 DIAGNOSIS — N858 Other specified noninflammatory disorders of uterus: Secondary | ICD-10-CM

## 2019-06-09 DIAGNOSIS — N841 Polyp of cervix uteri: Secondary | ICD-10-CM | POA: Insufficient documentation

## 2019-06-09 DIAGNOSIS — R9389 Abnormal findings on diagnostic imaging of other specified body structures: Secondary | ICD-10-CM | POA: Insufficient documentation

## 2019-06-09 DIAGNOSIS — N840 Polyp of corpus uteri: Secondary | ICD-10-CM

## 2019-06-12 ENCOUNTER — Other Ambulatory Visit: Payer: Self-pay | Admitting: Gastroenterology

## 2019-06-14 ENCOUNTER — Other Ambulatory Visit: Payer: Self-pay | Admitting: Gastroenterology

## 2019-06-16 LAB — SURGICAL PATHOLOGY

## 2019-06-16 NOTE — Telephone Encounter (Signed)
Outside  mammo for review

## 2019-06-26 ENCOUNTER — Telehealth: Payer: Self-pay | Admitting: Primary Care

## 2019-06-26 DIAGNOSIS — I10 Essential (primary) hypertension: Secondary | ICD-10-CM

## 2019-06-26 DIAGNOSIS — E119 Type 2 diabetes mellitus without complications: Secondary | ICD-10-CM

## 2019-06-26 NOTE — Telephone Encounter (Signed)
See lab orders

## 2019-06-26 NOTE — Telephone Encounter (Signed)
Call from [place] who states she has lab work ordered at every visit and has an appointment 10/30 and wants to know if she is supposed to have labs.      She is aware Dr. Lissa Merlin is off site today and tomorrow and would like a call back on Wednesday to advise.     Please advise.

## 2019-06-27 NOTE — Telephone Encounter (Signed)
LMT[employer] and my chart message sent

## 2019-06-28 ENCOUNTER — Other Ambulatory Visit
Admission: RE | Admit: 2019-06-28 | Discharge: 2019-06-28 | Disposition: A | Discharge status: Home / Self Care | Payer: Medicare (Managed Care) | Source: Ambulatory Visit | Attending: Primary Care | Admitting: Primary Care

## 2019-06-28 DIAGNOSIS — E119 Type 2 diabetes mellitus without complications: Secondary | ICD-10-CM | POA: Insufficient documentation

## 2019-06-28 DIAGNOSIS — I10 Essential (primary) hypertension: Secondary | ICD-10-CM | POA: Insufficient documentation

## 2019-06-28 LAB — CBC
Hematocrit: 40 % (ref 34–45)
Hemoglobin: 13.1 g/dL (ref 11.2–15.7)
MCH: 29 pg/cell (ref 26–32)
MCHC: 33 g/dL (ref 32–36)
MCV: 90 fL (ref 79–95)
Platelets: 318 10*3/uL (ref 160–370)
RBC: 4.5 MIL/uL (ref 3.9–5.2)
RDW: 15.3 % — ABNORMAL HIGH (ref 11.7–14.4)
WBC: 6.3 10*3/uL (ref 4.0–10.0)

## 2019-06-28 LAB — COMPREHENSIVE METABOLIC PANEL
ALT: 21 U/L (ref 0–35)
AST: 20 U/L (ref 0–35)
Albumin: 4.4 g/dL (ref 3.5–5.2)
Alk Phos: 62 U/L (ref 35–105)
Anion Gap: 9 (ref 7–16)
Bilirubin,Total: 0.4 mg/dL (ref 0.0–1.2)
CO2: 31 mmol/L — ABNORMAL HIGH (ref 20–28)
Calcium: 10 mg/dL (ref 8.6–10.2)
Chloride: 99 mmol/L (ref 96–108)
Creatinine: 0.88 mg/dL (ref 0.51–0.95)
GFR,Black: 80 *
GFR,Caucasian: 70 *
Glucose: 91 mg/dL (ref 60–99)
Lab: 12 mg/dL (ref 6–20)
Potassium: 4.3 mmol/L (ref 3.3–5.1)
Sodium: 139 mmol/L (ref 133–145)
Total Protein: 7.7 g/dL (ref 6.3–7.7)

## 2019-06-28 LAB — URINALYSIS REFLEX TO CULTURE
Blood,UA: NEGATIVE
Glucose,UA: NEGATIVE mg/dL
Ketones, UA: NEGATIVE
Leuk Esterase,UA: NEGATIVE
Nitrite,UA: NEGATIVE
Protein,UA: NEGATIVE mg/dL
Specific Gravity,UA: 1.014 (ref 1.002–1.030)
pH,UA: 7 (ref 5.0–8.0)

## 2019-06-28 LAB — LIPID PANEL
Chol/HDL Ratio: 2.2
Cholesterol: 209 mg/dL — AB
HDL: 93 mg/dL — ABNORMAL HIGH (ref 40–60)
LDL Calculated: 103 mg/dL
Non HDL Cholesterol: 116 mg/dL
Triglycerides: 64 mg/dL

## 2019-06-29 LAB — HEMOGLOBIN A1C: Hemoglobin A1C: 6.7 % — ABNORMAL HIGH

## 2019-06-29 NOTE — Progress Notes (Signed)
Robin Green is a 63 y.o. female who presents for Medication Management    Seen for follow-up of the following chronic medical problems    Hypertension-no headache or vision problems  Type 2 diabetes-can only tolerate Metformin once a day.  A family member took vertebrae and she wants to know if it safe to take it  Hyperlipidemia-no chest pain  Obesity-has lost a few pounds.  Sudden hearing loss-no recent changes  Insomnia-no changes  Lumbar spondylosis-at baseline  Review of Systems   Constitutional: Negative for chills, fever and weight loss.   Respiratory: Negative for cough, shortness of breath and wheezing.    Cardiovascular: Negative for chest pain and palpitations.   Psychiatric/Behavioral: Negative for depression. The patient is not nervous/anxious.        MEDICATIONS     Current Outpatient Medications   Medication Sig    Magnesium Oxide 500 MG TABS Take 1 tablet by mouth daily    Potassium 99 MG TABS Take by mouth     verapamil (CALAN-SR) 240 MG CR tablet Take 1 tablet (240 mg total) by mouth nightly    hydroCHLOROthiazide (HYDRODIURIL) 25 MG tablet Take 1 tablet (25 mg total) by mouth every morning    blood glucose test strip By no specified route 4 times daily Test 3-4 times a day. Freestyle Freedom. Dx: DM-2    TURMERIC PO Take 1 tablet by mouth daily    blood glucose monitor (FREESTYLE FREEDOM LITE) kit Use daily for BG testing    Non-System Medication Abbott freestyle lifestyle touch dx: DM-2    Non-System Medication BP cuff, large Dx: HTN    cholecalciferol (VITAMIN D) 1000 UNIT tablet Take 2,000 Units by mouth daily       metFORMIN (GLUCOPHAGE) 500 MG tablet Take 1 tablet (500 mg total) by mouth 2 times daily (with meals)    ALPRAZolam (XANAX) 0.25 MG tablet Take 1 tablet (0.25 mg total) by mouth 2 times daily as needed for Anxiety Max daily dose: 0.5 mg    nystatin (MYCOSTATIN) ointment Apply topically 2 times daily as needed  30GM Use topically twice daily on affected area[s]    Melatonin 3 MG CAPS Take 3 mg by mouth nightly as needed    amoxicillin (AMOXIL) 500 MG capsule Prior to dental procedures       Patient's medications were reviewed and updated at today's visit and no changes were made    ALLERGIES     Seafood and Lisinopril      Patient's allergies were reviewed and updated at today's visit and no changes were made    PROBLEM LIST     Patient Active Problem List   Diagnosis Code    Sudden hearing loss H91.20    Obesity (BMI 30-39.9) E66.9    Type 2 diabetes mellitus E11.9    Carpal tunnel syndrome G56.00    HTN (hypertension) I10    Osteoarthritis, knee M17.10    Presence of right artificial knee joint Z96.651    Hyperlipidemia, mixed E78.2    Chronic left-sided low back pain with left-sided sciatica M54.42, G89.29    Lumbar spondylosis M47.816         TOBACCO USE HISTORY     Social History     Tobacco Use   Smoking Status Never Smoker   Smokeless Tobacco Never Used       PHYSICAL EXAM     BP  138/78 (BP Location: Left arm, Patient Position: Sitting, Cuff Size: adult)    Pulse 76    Ht 1.626 m (_0 )    Wt 91.2 kg (201 lb)    SpO2 97%    BMI 34.50 kg/m         Physical Exam   Constitutional: She is oriented to person, place, and time.   Pleasant lady in no distress   HENT:   Head: Normocephalic and atraumatic.   Right Ear: Tympanic membrane, external ear and ear canal normal.   Left Ear: Tympanic membrane, external ear and ear canal normal.   Eyes: Right eye exhibits no discharge. Left eye exhibits no discharge. No scleral icterus.   Neck: Neck supple.   Cardiovascular: Normal rate and regular rhythm. Exam reveals no gallop and no friction rub.   No murmur heard.  Pulmonary/Chest: Effort normal and breath sounds normal. No respiratory distress. She has no wheezes.   Neurological: She is alert and oriented to person, place, and time.   Skin: Skin is warm and dry.   Psychiatric: Mood and affect normal.   Nursing note  and vitals reviewed.        RECENT LABS     Recent Results (from the past 336 hour(s))   Hemoglobin A1c    Collection Time: 06/28/19  2:07 PM   Result Value Ref Range    Hemoglobin A1C 6.7 (H) %   Lipid Panel (Reflex to Direct  LDL if Triglycerides more than 400)    Collection Time: 06/28/19  2:07 PM   Result Value Ref Range    Cholesterol 209 (!) mg/dL    Triglycerides 64 mg/dL    HDL 93 (H) 40 - 60 mg/dL    LDL Calculated 103 mg/dL    Non HDL Cholesterol 116 mg/dL    Chol/HDL Ratio 2.2    Comprehensive metabolic panel    Collection Time: 06/28/19  2:07 PM   Result Value Ref Range    Sodium 139 133 - 145 mmol/L    Potassium 4.3 3.3 - 5.1 mmol/L    Chloride 99 96 - 108 mmol/L    CO2 31 (H) 20 - 28 mmol/L    Anion Gap 9 7 - 16    UN 12 6 - 20 mg/dL    Creatinine 0.88 0.51 - 0.95 mg/dL    GFR,Caucasian 70 *    GFR,Black 80 *    Glucose 91 60 - 99 mg/dL    Calcium 10.0 8.6 - 10.2 mg/dL    Total Protein 7.7 6.3 - 7.7 g/dL    Albumin 4.4 3.5 - 5.2 g/dL    Bilirubin,Total 0.4 0.0 - 1.2 mg/dL    AST 20 0 - 35 U/L    ALT 21 0 - 35 U/L    Alk Phos 62 35 - 105 U/L   CBC    Collection Time: 06/28/19  2:07 PM   Result Value Ref Range    WBC 6.3 4.0 - 10.0 THOU/uL    RBC 4.5 3.9 - 5.2 MIL/uL    Hemoglobin 13.1 11.2 - 15.7 g/dL    Hematocrit 40 34 - 45 %    MCV 90 79 - 95 fL    MCH 29 26 - 32 pg/cell    MCHC 33 32 - 36 g/dL    RDW 15.3 (H) 11.7 - 14.4 %    Platelets 318 160 - 370 THOU/uL   Urinalysis reflex to culture    Collection Time: 06/28/19  2:07 PM   Result Value Ref Range    Color, UA Yellow Yellow-Dark Yellow    Appearance,UR Clear Clear    Specific Gravity,UA 1.014 1.002 - 1.030    Leuk Esterase,UA NEG NEGATIVE    Nitrite,UA NEG NEGATIVE    pH,UA 7.0 5.0 - 8.0    Protein,UA NEG NEGATIVE mg/dL    Glucose,UA NEG NEGATIVE mg/dL    Ketones, UA NEG NEGATIVE    Blood,UA NEG NEGATIVE            ASSESSMENT/PLAN     1. Essential hypertension  Continue to follow low-salt diet and exercise 30 minutes most days of the week.   Continue to work on weight loss.  Continue verapamil to 40 mg a day and hydrochlorothiazide 25 mg a day.  Goal blood pressure 130/80 or less.  Labs reviewed with patient.  - CBC; Future    2. Type 2 diabetes mellitus  Patient states that she can only tolerate the Metformin once a day.  She will work aggressively on diet and exercise and work on weight loss.  Continue Metformin once a day.  Labs reviewed with patient.  - Comprehensive metabolic panel; Future  - Lipid Panel (Reflex to Direct  LDL if Triglycerides more than 400); Future  - Hemoglobin A1c; Future  - Microalbumin, Urine, Random; Future    3. Hyperlipidemia, mixed  The 10-year ASCVD risk score Mikey Bussing DC Jr., et al., 2013) is: 15.9%    Values used to calculate the score:      Age: 89 years      Sex: Female      Is Non-Hispanic African American: Yes      Diabetic: Yes      Tobacco smoker: No      Systolic Blood Pressure: 675 mmHg      Is BP treated: No      HDL Cholesterol: 93 mg/dL      Total Cholesterol: 209 mg/dL  Patient well understands the risk for MI and stroke.  She is making an informed decision to hold off on statins for the next 3 months.  We will work aggressively on diet and exercise  4. Obesity (BMI 30-39.9)  Continue to work on weight loss    5. Sudden hearing loss  Managed by ENT    6. Insomnia, unspecified type  Supportive care    7. Lumbar spondylosis  Supportive care    Return in about 3 months (around 09/30/2019) for diabetes, Follow-up.    --Patient instructed to call if symptoms are not improving or worsening  --Follow-up arranged    Signed: Earna Coder, MD on 06/30/2019 at 3:02 PM

## 2019-06-30 ENCOUNTER — Encounter: Payer: Self-pay | Admitting: Primary Care

## 2019-06-30 ENCOUNTER — Ambulatory Visit: Payer: Medicare (Managed Care) | Admitting: Primary Care

## 2019-06-30 ENCOUNTER — Telehealth: Payer: Self-pay | Admitting: Primary Care

## 2019-06-30 VITALS — BP 138/78 | HR 76 | Ht 64.0 in | Wt 201.0 lb

## 2019-06-30 DIAGNOSIS — H9120 Sudden idiopathic hearing loss, unspecified ear: Secondary | ICD-10-CM

## 2019-06-30 DIAGNOSIS — E669 Obesity, unspecified: Secondary | ICD-10-CM

## 2019-06-30 DIAGNOSIS — E119 Type 2 diabetes mellitus without complications: Secondary | ICD-10-CM

## 2019-06-30 DIAGNOSIS — M47816 Spondylosis without myelopathy or radiculopathy, lumbar region: Secondary | ICD-10-CM

## 2019-06-30 DIAGNOSIS — E782 Mixed hyperlipidemia: Secondary | ICD-10-CM

## 2019-06-30 DIAGNOSIS — G4700 Insomnia, unspecified: Secondary | ICD-10-CM

## 2019-06-30 DIAGNOSIS — Z23 Encounter for immunization: Secondary | ICD-10-CM

## 2019-06-30 DIAGNOSIS — I10 Essential (primary) hypertension: Secondary | ICD-10-CM

## 2019-06-30 NOTE — Telephone Encounter (Signed)
Return in about 3 months (around 09/30/2019) for diabetes, Follow-up.    Check out comments: 3 months for follow-up of diabetes.See lab orders.RFC     Check out incomplete

## 2019-07-03 NOTE — Telephone Encounter (Signed)
appt in place

## 2019-07-03 NOTE — Telephone Encounter (Signed)
[employer] and Letter mailed and sent via my chart     Checkout incomplete    2 attempts to pt    Placed pt in recall

## 2019-08-08 ENCOUNTER — Other Ambulatory Visit: Payer: Self-pay | Admitting: Primary Care

## 2019-08-08 MED ORDER — ALPRAZOLAM 0.25 MG PO TABS *I*
0.2500 mg | ORAL_TABLET | Freq: Two times a day (BID) | ORAL | 0 refills | Status: DC | PRN
Start: 2019-08-08 — End: 2020-07-26

## 2019-08-08 NOTE — Telephone Encounter (Signed)
Last appointment: 06/30/2019  Next appointment: 09/29/2019    No Controlled Substance Agreement  No urine tox      PMP    Patient Name: Robin Green   Birth Date: 1956-02-13   Address: [place], [place] [address]   Sex: Female   Rx Written Rx Dispensed Drug Quantity Days Supply Prescriber Name Payment Method Dispenser   09/16/2018 09/16/2018 oxycodone-acetaminophen 5-325 mg tab  [address] Salomon Fick, Insurance [id number]   09/16/2018 09/16/2018 diazepam 5 mg tablet  21 [address] Salomon Fick, Insurance [id number]   08/30/2018 08/30/2018 alprazolam 0.25 mg tablet  60 30 Aura Fey MD Insurance [facility] [phone]   * - Drugs marked with an asterisk are compound drugs. If the compound drug is made up of more than one controlled substance

## 2019-08-08 NOTE — Telephone Encounter (Signed)
Last appointment: 06/30/2019   Next appointment: 09/29/2019

## 2019-08-14 ENCOUNTER — Encounter: Payer: Self-pay | Admitting: Primary Care

## 2019-08-14 NOTE — Telephone Encounter (Signed)
Per chart she is on tablets.  Please call pharmacy to clarify

## 2019-08-15 ENCOUNTER — Other Ambulatory Visit: Payer: Self-pay | Admitting: Primary Care

## 2019-08-15 MED ORDER — HYDROCHLOROTHIAZIDE 12.5 MG PO TABS *I*
25.0000 mg | ORAL_TABLET | Freq: Every day | ORAL | 5 refills | Status: DC
Start: 2019-08-15 — End: 2019-08-15

## 2019-08-15 NOTE — Telephone Encounter (Signed)
MM updated, pt last OV 06/30/19, next OV 09/29/19

## 2019-08-15 NOTE — Telephone Encounter (Signed)
Last appointment: 06/30/2019   Next appointment: 09/29/2019

## 2019-08-15 NOTE — Telephone Encounter (Signed)
Last appointment: 06/30/2019  Next appointment: 09/29/2019    Mm updated

## 2019-09-11 ENCOUNTER — Other Ambulatory Visit: Payer: Self-pay | Admitting: Primary Care

## 2019-09-11 NOTE — Telephone Encounter (Signed)
updateing pharmacy

## 2019-09-29 ENCOUNTER — Ambulatory Visit: Payer: Medicare (Managed Care) | Admitting: Primary Care

## 2019-10-23 ENCOUNTER — Other Ambulatory Visit
Admission: RE | Admit: 2019-10-23 | Discharge: 2019-10-23 | Disposition: A | Discharge status: Home / Self Care | Payer: Medicare (Managed Care) | Source: Ambulatory Visit | Attending: Primary Care | Admitting: Primary Care

## 2019-10-23 DIAGNOSIS — I10 Essential (primary) hypertension: Secondary | ICD-10-CM

## 2019-10-23 DIAGNOSIS — E119 Type 2 diabetes mellitus without complications: Secondary | ICD-10-CM | POA: Insufficient documentation

## 2019-10-23 LAB — CBC
Hematocrit: 39 % (ref 34–45)
Hemoglobin: 12.6 g/dL (ref 11.2–15.7)
MCH: 28 pg/cell (ref 26–32)
MCHC: 32 g/dL (ref 32–36)
MCV: 89 fL (ref 79–95)
Platelets: 292 10*3/uL (ref 160–370)
RBC: 4.4 MIL/uL (ref 3.9–5.2)
RDW: 14.8 % — ABNORMAL HIGH (ref 11.7–14.4)
WBC: 5 10*3/uL (ref 4.0–10.0)

## 2019-10-23 LAB — COMPREHENSIVE METABOLIC PANEL
ALT: 19 U/L (ref 0–35)
AST: 23 U/L (ref 0–35)
Albumin: 4.2 g/dL (ref 3.5–5.2)
Alk Phos: 62 U/L (ref 35–105)
Anion Gap: 11 (ref 7–16)
Bilirubin,Total: 0.5 mg/dL (ref 0.0–1.2)
CO2: 27 mmol/L (ref 20–28)
Calcium: 9.5 mg/dL (ref 8.6–10.2)
Chloride: 100 mmol/L (ref 96–108)
Creatinine: 0.93 mg/dL (ref 0.51–0.95)
GFR,Black: 75 *
GFR,Caucasian: 65 *
Glucose: 107 mg/dL — ABNORMAL HIGH (ref 60–99)
Lab: 12 mg/dL (ref 6–20)
Potassium: 4.1 mmol/L (ref 3.3–5.1)
Sodium: 138 mmol/L (ref 133–145)
Total Protein: 7.3 g/dL (ref 6.3–7.7)

## 2019-10-23 LAB — LIPID PANEL
Chol/HDL Ratio: 2.2
Cholesterol: 201 mg/dL — AB
HDL: 90 mg/dL — ABNORMAL HIGH (ref 40–60)
LDL Calculated: 98 mg/dL
Non HDL Cholesterol: 111 mg/dL
Triglycerides: 63 mg/dL

## 2019-10-23 LAB — MICROALBUMIN, URINE, RANDOM
Creatinine,UR: 59 mg/dL (ref 20–300)
Microalbumin,UR: <1.2 mg/dL

## 2019-10-24 LAB — HEMOGLOBIN A1C: Hemoglobin A1C: 6.6 % — ABNORMAL HIGH

## 2019-10-26 ENCOUNTER — Encounter: Payer: Self-pay | Admitting: Gastroenterology

## 2019-10-27 ENCOUNTER — Telehealth: Payer: Self-pay | Admitting: Primary Care

## 2019-10-27 ENCOUNTER — Encounter: Payer: Self-pay | Admitting: Primary Care

## 2019-10-27 ENCOUNTER — Ambulatory Visit: Payer: Medicare (Managed Care) | Admitting: Primary Care

## 2019-10-27 VITALS — BP 146/84 | HR 70 | Ht 64.0 in | Wt 201.0 lb

## 2019-10-27 DIAGNOSIS — E669 Obesity, unspecified: Secondary | ICD-10-CM

## 2019-10-27 DIAGNOSIS — E119 Type 2 diabetes mellitus without complications: Secondary | ICD-10-CM

## 2019-10-27 DIAGNOSIS — E782 Mixed hyperlipidemia: Secondary | ICD-10-CM

## 2019-10-27 DIAGNOSIS — G4700 Insomnia, unspecified: Secondary | ICD-10-CM

## 2019-10-27 DIAGNOSIS — I10 Essential (primary) hypertension: Secondary | ICD-10-CM

## 2019-10-27 DIAGNOSIS — M25512 Pain in left shoulder: Secondary | ICD-10-CM

## 2019-10-27 MED ORDER — VERAPAMIL HCL CR 360 MG PO CP24 *I*
360.0000 mg | ORAL_CAPSULE | Freq: Every evening | ORAL | 5 refills | Status: DC
Start: 2019-10-27 — End: 2019-11-08

## 2019-10-27 NOTE — Progress Notes (Signed)
[place]     Robin Green is a 64 y.o. female who presents for Follow-up, Diabetes, and Lab Results  Patient seen for follow-up of the following chronic medical problems.  Hypertension-has not been checking blood pressures at home  Diabetes-no changes in appetite or urination  Hyperlipidemia-no chest pain  Obesity no recent changes in sleep  Insomnia no new concerns  Reports ongoing left shoulder pain which is minimal.  Does not feel she is ready to go to physical therapy or see orthopedics yet just wants to make me aware.        Review of Systems   Constitutional: Negative for chills and fever.   Respiratory: Negative for cough, shortness of breath and wheezing.    Cardiovascular: Negative for chest pain and palpitations.   Gastrointestinal: Negative for abdominal pain, constipation, diarrhea, heartburn, nausea and vomiting.   Musculoskeletal: Positive for joint pain.   Psychiatric/Behavioral: Negative for depression. The patient is not nervous/anxious.        MEDICATIONS     Current Outpatient Medications   Medication Sig    Zinc Sulfate (ZINC-220 PO) Take by mouth    hydroCHLOROthiazide (HYDRODIURIL) 12.5 MG tablet TAKE 2 TABLETS(25 MG) BY MOUTH DAILY    Magnesium Oxide 500 MG TABS Take 1 tablet by mouth daily    Potassium 99 MG TABS Take by mouth     blood glucose test strip By no specified route 4 times daily Test 3-4 times a day. Freestyle Freedom. Dx: DM-2    TURMERIC PO Take 1 tablet by mouth daily    blood glucose monitor (FREESTYLE FREEDOM LITE) kit Use daily for BG testing    Non-System Medication Abbott freestyle lifestyle touch dx: DM-2    Non-System Medication BP cuff, large Dx: HTN    cholecalciferol (VITAMIN D) 1000 UNIT tablet Take 2,000 Units by mouth daily       verapamil (VERELAN) 360 MG 24 hr capsule Take 1 capsule (360 mg total) by mouth nightly    ALPRAZolam (XANAX) 0.25 MG tablet Take 1 tablet (0.25 mg total) by mouth 2 times daily as  needed for Anxiety Max daily dose: 0.5 mg    metFORMIN (GLUCOPHAGE) 500 MG tablet Take 1 tablet (500 mg total) by mouth 2 times daily (with meals)    nystatin (MYCOSTATIN) ointment Apply topically 2 times daily as needed 30GM Use topically twice daily on affected area[s]    Melatonin 3 MG CAPS Take 3 mg by mouth nightly as needed    amoxicillin (AMOXIL) 500 MG capsule Prior to dental procedures       Patient's medications were reviewed and updated at today's visit and no changes were made    ALLERGIES     Seafood and Lisinopril      Patient's allergies were reviewed and updated at today's visit and no changes were made    PROBLEM LIST     Patient Active Problem List   Diagnosis Code    Sudden hearing loss H91.20    Obesity (BMI 30-39.9) E66.9    Type 2 diabetes mellitus E11.9    Carpal tunnel syndrome G56.00    HTN (hypertension) I10    Osteoarthritis, knee M17.10    Presence of right artificial knee joint Z96.651    Hyperlipidemia, mixed E78.2    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   Right Ear: External ear normal.   Left Ear: External ear normal.   Nose: Nose normal.   Eyes: Right eye exhibits no discharge. Left eye exhibits no discharge. No scleral icterus.   Cardiovascular: Normal rate, regular rhythm and normal heart sounds. Exam reveals no gallop and no friction rub.   No murmur heard.  Pulmonary/Chest: Effort normal and breath sounds  normal. No respiratory distress. She has no wheezes. She has no rales.   Psychiatric: Affect normal.   Nursing note and vitals reviewed.        RECENT LABS     Recent Results (from the past 336 hour(s))   Hemoglobin A1c    Collection Time: 10/23/19  1:09 PM   Result Value Ref Range    Hemoglobin A1C 6.6 (H) %   Lipid Panel (Reflex to Direct  LDL if Triglycerides more than 400)    Collection Time: 10/23/19  1:09 PM   Result Value Ref Range    Cholesterol 201 (!) mg/dL    Triglycerides 63 mg/dL    HDL 90 (H) 40 - 60 mg/dL    LDL Calculated 98 mg/dL    Non HDL Cholesterol 111 mg/dL    Chol/HDL Ratio 2.2    Comprehensive metabolic panel    Collection Time: 10/23/19  1:09 PM   Result Value Ref Range    Sodium 138 133 - 145 mmol/L    Potassium 4.1 3.3 - 5.1 mmol/L    Chloride 100 96 - 108 mmol/L    CO2 27 20 - 28 mmol/L    Anion Gap 11 7 - 16    UN 12 6 - 20 mg/dL    Creatinine 0.93 0.51 - 0.95 mg/dL    GFR,Caucasian 65 *    GFR,Black 75 *    Glucose 107 (H) 60 - 99 mg/dL    Calcium 9.5 8.6 - 10.2 mg/dL    Total Protein 7.3 6.3 - 7.7 g/dL    Albumin 4.2 3.5 - 5.2 g/dL    Bilirubin,Total 0.5 0.0 - 1.2 mg/dL    AST 23 0 - 35 U/L    ALT 19 0 - 35 U/L    Alk Phos 62 35 - 105 U/L   CBC    Collection Time: 10/23/19  1:09 PM   Result Value Ref Range    WBC 5.0 4.0 - 10.0 THOU/uL    RBC 4.4 3.9 - 5.2 MIL/uL    Hemoglobin 12.6 11.2 - 15.7 g/dL    Hematocrit 39 34 - 45 %    MCV 89 79 - 95 fL    MCH 28 26 - 32 pg/cell    MCHC 32 32 - 36 g/dL    RDW 14.8 (H) 11.7 - 14.4 %    Platelets 292 160 - 370  THOU/uL   Microalbumin, Urine, Random    Collection Time: 10/23/19  1:24 PM   Result Value Ref Range    Creatinine,UR 59 20 - 300 mg/dL    Microalbumin,UR <1.20 mg/dL    Microalb/Creat Ratio see below 0.0 - 29.9 mg MA/g CR            ASSESSMENT/PLAN     1. Essential hypertension  Blood pressure is not well controlled.  Recommend increasing verapamil to 360 mg a day.  Stop 240 mg a day.  Continue hydrochlorothiazide 50 mg a day.  Follow  low-salt diet and exercise 30 minutes most days of the week.  Continue to work on weight loss.  Goal blood pressure 130/90 or less.  Reassess in 1 month on increased blood pressure medication    2. Type 2 diabetes mellitus  A1c is fairly well controlled.  Continue Metformin at current dosage.  Continue to work on weight loss.  Follow a low carbohydrate diet and exercise 30 minutes most days of the week.  Labs reviewed with patient.    3. Hyperlipidemia, mixed  The 10-year ASCVD risk score Mikey Bussing DC Jr., et al., 2013) is: 23%    Values used to calculate the score:      Age: 1 years      Sex: Female      Is Non-Hispanic African American: Yes      Diabetic: Yes      Tobacco smoker: No      Systolic Blood Pressure: 932 mmHg      Is BP treated: Yes      HDL Cholesterol: 90 mg/dL      Total Cholesterol: 201 mg/dL  Follow low-cholesterol diet and exercise 30 minutes most days of the week.  Reviewed with patient increased risk for MI and stroke and she declines statins at this time will be more aggressive with diet.  Fully understands her increased risk    4. Obesity (BMI 30-39.9)  Continue to work on weight loss    5. Insomnia, unspecified type  Continue melatonin as needed    Return in about 4 weeks (around 11/24/2019) for Follow-up, HTN.    --Patient instructed to call if symptoms are not improving or worsening  --Follow-up arranged    Signed: Earna Coder, MD on 10/27/2019 at 1:47 PM

## 2019-10-27 NOTE — Telephone Encounter (Signed)
Return in about 4 weeks (around 11/24/2019) for Follow-up, HTN  Check out not completed

## 2019-10-30 NOTE — Telephone Encounter (Signed)
Contacted Pt and is scheduled for 4 week F/u all set

## 2019-10-31 ENCOUNTER — Other Ambulatory Visit: Payer: Self-pay | Admitting: Primary Care

## 2019-11-07 NOTE — Telephone Encounter (Signed)
Please advise/send?

## 2019-11-07 NOTE — Telephone Encounter (Signed)
No other ER 360 mg comes up in the system. Please give verbal to switch med to available form

## 2019-11-07 NOTE — Telephone Encounter (Signed)
Call from [place] who states [facility] advised her they never got the script for her Verapamil.     [facility] sees the script went over 2/26.    Call to [facility] who advised [facility] that the Verapamil SR360 was received but is not available at [facility] (they are unable to order the SR)  The pharmacist did advise that they should be able to order the ER Verapamil in 360 or 180.      Last appointment: 10/27/2019  Next appointment: 11/29/2019        Naarah is aware Dr. Rennis Harding is off site today

## 2019-11-08 MED ORDER — VERAPAMIL HCL CR 180 MG PO CP24 *I*
360.0000 mg | ORAL_CAPSULE | Freq: Every evening | ORAL | 5 refills | Status: DC
Start: 2019-11-08 — End: 2019-11-27

## 2019-11-08 NOTE — Telephone Encounter (Signed)
Spoke to pharmacist their is only a 180 mg  er so updated EMR and they won't know if it is covered until they run it thru the system

## 2019-11-08 NOTE — Addendum Note (Signed)
Addended by: Janit Bern A on: 11/08/2019 09:29 AM     Modules accepted: Orders

## 2019-11-08 NOTE — Addendum Note (Signed)
Addended by: Hetty Ely on: 11/08/2019 09:30 AM     Modules accepted: Orders

## 2019-11-27 ENCOUNTER — Other Ambulatory Visit: Payer: Self-pay | Admitting: Primary Care

## 2019-11-27 MED ORDER — VERAPAMIL HCL CR 180 MG PO CP24 *I*
360.0000 mg | ORAL_CAPSULE | Freq: Every evening | ORAL | 5 refills | Status: DC
Start: 2019-11-27 — End: 2019-12-18

## 2019-11-27 MED ORDER — HYDROCHLOROTHIAZIDE 12.5 MG PO TABS *I*
25.0000 mg | ORAL_TABLET | Freq: Every day | ORAL | 1 refills | Status: DC
Start: 2019-11-27 — End: 2019-12-08

## 2019-11-27 NOTE — Telephone Encounter (Signed)
Last appointment: 10/27/2019  Next appointment: 11/29/2019

## 2019-11-28 NOTE — Progress Notes (Signed)
[place]     Robin Green is a 64 y.o. female who presents for Follow-up and Hypertension    Patient seen for follow-up of hypertension.  At her last visit verapamil was increased to 360 mg a day and she was told to take this along with hydrochlorothiazide.  Unfortunately patient has not increased her verapamil to 360 mg as discussed.  She is lost 6 pounds but is frustrated that her blood pressure is still high.  She thinks it may be from some potato chips that she has been eating.  Has not been checking her blood pressures at home      Review of Systems   Constitutional: Negative for chills, fever and weight loss.   Respiratory: Negative for cough, shortness of breath and wheezing.    Cardiovascular: Negative for chest pain and palpitations.   Psychiatric/Behavioral: Negative for depression. The patient is not nervous/anxious.        MEDICATIONS     Current Outpatient Medications   Medication Sig    hydroCHLOROthiazide (HYDRODIURIL) 12.5 MG tablet Take 2 tablets (25 mg total) by mouth daily    verapamil (VERELAN) 180 MG 24 hr capsule Take 2 capsules (360 mg total) by mouth nightly    Zinc Sulfate (ZINC-220 PO) Take by mouth    Magnesium Oxide 500 MG TABS Take 1 tablet by mouth daily    Potassium 99 MG TABS Take by mouth     blood glucose test strip By no specified route 4 times daily Test 3-4 times a day. Freestyle Freedom. Dx: DM-2    metFORMIN (GLUCOPHAGE) 500 MG tablet Take 1 tablet (500 mg total) by mouth 2 times daily (with meals)    TURMERIC PO Take 1 tablet by mouth daily    blood glucose monitor (FREESTYLE FREEDOM LITE) kit Use daily for BG testing    Non-System Medication Abbott freestyle lifestyle touch dx: DM-2    Non-System Medication BP cuff, large Dx: HTN    cholecalciferol (VITAMIN D) 1000 UNIT tablet Take 2,000 Units by mouth daily       ALPRAZolam (XANAX) 0.25 MG tablet Take 1 tablet (0.25 mg total) by mouth 2 times daily as needed for  Anxiety Max daily dose: 0.5 mg    nystatin (MYCOSTATIN) ointment Apply topically 2 times daily as needed 30GM Use topically twice daily on affected area[s]    Melatonin 3 MG CAPS Take 3 mg by mouth nightly as needed    amoxicillin (AMOXIL) 500 MG capsule Prior to dental procedures       Patient's medications were reviewed and updated at today's visit and no changes were made    ALLERGIES     Seafood and Lisinopril      Patient's allergies were reviewed and updated at today's visit and no changes were made    PROBLEM LIST     Patient Active Problem List   Diagnosis Code    Sudden hearing loss H91.20    Obesity (BMI 30-39.9) E66.9    Type 2 diabetes mellitus E11.9    Carpal tunnel syndrome G56.00    HTN (hypertension) I10    Osteoarthritis, knee M17.10    Presence of right artificial knee joint Z96.651    Hyperlipidemia, mixed E78.2    Chronic left-sided low back pain with left-sided sciatica M54.42, G89.29    Lumbar spondylosis M47.816         TOBACCO USE HISTORY     Social History  Tobacco Use   Smoking Status Never Smoker   Smokeless Tobacco Never Used       PHYSICAL EXAM     BP 132/78    Pulse 72    Ht 1.626 m ('5\' 4"' )    Wt 88.5 kg (195 lb)    BMI 33.47 kg/m     General Appearance: No distress    Physical Exam  Vitals reviewed.   Constitutional:       General: She is not in acute distress.     Comments: Pleasant lady in no distress, wearing a mask   HENT:      Head: Normocephalic and atraumatic.      Right Ear: External ear normal.      Left Ear: External ear normal.      Nose: Nose normal.   Eyes:      General: No scleral icterus.        Right eye: No discharge.         Left eye: No discharge.      Conjunctiva/sclera: Conjunctivae normal.   Cardiovascular:      Rate and Rhythm: Normal rate and regular rhythm.      Heart sounds: Normal heart sounds. No murmur. No gallop.    Pulmonary:      Effort: Pulmonary effort is normal. No respiratory distress.      Breath sounds: Normal breath sounds. No  stridor. No wheezing, rhonchi or rales.   Neurological:      Mental Status: She is alert.   Psychiatric:         Mood and Affect: Mood normal.           RECENT LABS   No results found for this or any previous visit (from the past 336 hour(s)).         ASSESSMENT/PLAN     1. Essential hypertension  Continue current management.  Recommend that she take 25 mg of hydrochlorothiazide and 360 mg of verapamil.  Will reassess at next visit in 1 month.  Follow low-salt diet and exercise 30 minutes most days of the week.  Continue to work on weight loss.  Congratulated on weight loss.    Return if symptoms worsen or fail to improve.    --Patient instructed to call if symptoms are not improving or worsening  --Follow-up arranged    Signed: Earna Coder, MD on 11/29/2019 at 4:38 PM

## 2019-11-29 ENCOUNTER — Ambulatory Visit: Payer: Medicare (Managed Care) | Admitting: Primary Care

## 2019-11-29 ENCOUNTER — Encounter: Payer: Self-pay | Admitting: Primary Care

## 2019-11-29 VITALS — BP 132/78 | HR 72 | Ht 64.0 in | Wt 195.0 lb

## 2019-11-29 DIAGNOSIS — I10 Essential (primary) hypertension: Secondary | ICD-10-CM

## 2019-12-07 ENCOUNTER — Ambulatory Visit: Payer: Medicare (Managed Care) | Attending: Optometry | Admitting: Optometry

## 2019-12-07 DIAGNOSIS — H40003 Preglaucoma, unspecified, bilateral: Secondary | ICD-10-CM | POA: Insufficient documentation

## 2019-12-07 NOTE — Progress Notes (Signed)
Outpatient Visit      Patient name: Robin Green  DOB: [birth year]       Age: 64 y.o.  MR#: [id number]    Encounter Date: 12/07/2019    Subjective:      Chief Complaint   Patient presents with    Glaucoma     FUV     HPI     Glaucoma      Additional comments: FUV              Comments      Yenifer Saccente is a 64 y.o. female here for NPV glaucoma eval referred by   Dr   Derrick Ravel. Pt has a history of glaucoma suspect and cataracts . Pt last CEE   was in Jan 2021 and she states she thought everything was normal. Pt has   no complaints regarding her vision or her glasses. No FHx of ocular   diseases. No Hx of eye injuries or eye surgeries. Pt noticed a few   floaters in her right eye at her last exam which her doctor check. Pt   states she almost never notices them. Pt denies flashes or pain. Pt is   also diabetic.     Type of Glaucoma: Glaucoma suspect of both eyes    Per Last Glaucoma note: Follow up prn  Glaucoma Meds: None.   Compliant with Glaucoma Meds:   Prior Surgeries w/ dates: None.     Last IOP: OU 14 applanation on 08/09/17  Last DFE: never  Last HVF: none  Last OCT RNFL: none       Other Ocular Meds: None.     Length of Diabetes: Five years  Today's Fasting Blood Sugar: did not check   Lab Results       Component                Value               Date                       HA1C                     6.6 (H)             10/23/2019                            Last edited by Jesse Sans, OD on 12/07/2019 11:01 AM. (History)        has a current medication list which includes the following prescription(s): hydrochlorothiazide, verapamil, zinc sulfate, alprazolam, magnesium oxide, potassium, blood glucose, metformin, turmeric, nystatin, melatonin, amoxicillin, freestyle freedom lite, non-system medication, non-system medication, and cholecalciferol.     is allergic to seafood and lisinopril.      Past Medical History:   Diagnosis Date    Anxiety     Diabetes     Diabetes mellitus     High blood pressure      Hypertension       Past Surgical History:   Procedure Laterality Date    BREAST BIOPSY  2008    CHOLECYSTECTOMY, LAPAROSCOPIC      JOINT REPLACEMENT      left TKR    KNEE REPLACEMENT  2008/2017    x2        Specialty Problems        Ophthalmology Problems    Type 2  diabetes mellitus               ROS     Positive for: Eyes    Negative for: Constitutional, Gastrointestinal, Neurological, Skin,   Genitourinary, Musculoskeletal, HENT, Endocrine, Cardiovascular,   Respiratory, Psychiatric, Allergic/Imm, Heme/Lymph    Last edited by Bartholomew Crews, COA on 12/06/2019  4:02 PM. (History)         Objective:     Base Eye Exam     Visual Acuity (Snellen - Linear)       Right Left    Dist cc 20/20 -2 20/20 -1    Correction: Glasses          Tonometry (Tonopen, 10:48 AM)       Right Left    Pressure 21 24          Tonometry #2 (Applanation, 11:01 AM)       Right Left    Pressure 20 22          Pachymetry (12/07/2019)       Right Left    Thickness 611 616          Pupils       Dark Light Shape React APD    Right 4 3 Round Brisk None    Left 4 3 Round Brisk None          Visual Fields (Counting fingers)       Left Right     Full           Extraocular Movement       Right Left     Full, Ortho Full, Ortho          Neuro/Psych     Oriented x3: Yes    Mood/Affect: Normal            Slit Lamp and Fundus Exam     External Exam       Right Left    External Normal ocular adnexae, lacrimal gland & drainage, orbits Normal ocular adnexae, lacrimal gland & drainage, orbits          Slit Lamp Exam       Right Left    Lids/Lashes Normal structure & position Normal structure & position    Conjunctiva/Sclera Normal bulbar/palpebral, conjunctiva, sclera Normal bulbar/palpebral, conjunctiva, sclera    Cornea Normal epithelium, stroma, endothelium, tear film Normal epithelium, stroma, endothelium, tear film    Anterior Chamber Clear & deep Clear & deep    Iris Normal shape, size, morphology Normal shape, size, morphology    Lens 2+ Nuclear  sclerosis 2+ Nuclear sclerosis    Vitreous Clear Clear          Fundus Exam       Right Left    Disc good rims good rims    C/D Ratio 0.3 0.3    Macula Normal Normal    Vessels Normal Normal            Refraction     Wearing Rx       Sphere Cylinder Axis    Right +1.00 -0.75 086    Left +2.00 -1.50 079    Age: >48yr    Type: PAL                        No annotated images are attached to the encounter.      Assessment/Plan:      1. Glaucoma suspect of both eyes  OCT, RNFL-OU    HVF threshold-OU         PLAN:  1.  Glaucoma suspect 2' to borderline IOP   -IOP today 20,22, thick corneas (pachys in 600's)   -OCT RNFL WNL OU   -HVF WNL OU   Small cups, good rims, no evidence of glaucoma damage.  There is low/no suspicion of glaucoma a this time.  Will monitor yearly.

## 2019-12-08 ENCOUNTER — Other Ambulatory Visit: Payer: Self-pay | Admitting: Primary Care

## 2019-12-08 MED ORDER — HYDROCHLOROTHIAZIDE 12.5 MG PO TABS *I*
25.0000 mg | ORAL_TABLET | Freq: Every day | ORAL | 1 refills | Status: DC
Start: 2019-12-08 — End: 2020-01-31

## 2019-12-08 NOTE — Telephone Encounter (Signed)
Scheduled : 12/29/2019   Last OV : 11/29/2019   Pended 90 day as pt requested

## 2019-12-18 ENCOUNTER — Encounter: Payer: Self-pay | Admitting: Primary Care

## 2019-12-18 ENCOUNTER — Other Ambulatory Visit: Payer: Self-pay | Admitting: Primary Care

## 2019-12-18 MED ORDER — VERAPAMIL HCL CR 180 MG PO CP24 *I*
360.0000 mg | ORAL_CAPSULE | Freq: Every evening | ORAL | 1 refills | Status: DC
Start: 2019-12-18 — End: 2019-12-29

## 2019-12-18 NOTE — Telephone Encounter (Signed)
As noted

## 2019-12-18 NOTE — Telephone Encounter (Signed)
Pt wants 90 day supply    Last appointment: 11/29/2019  Next appointment: 12/29/2019

## 2019-12-25 ENCOUNTER — Other Ambulatory Visit
Admission: RE | Admit: 2019-12-25 | Discharge: 2019-12-25 | Disposition: A | Discharge status: Home / Self Care | Payer: Medicare (Managed Care) | Source: Ambulatory Visit | Attending: Primary Care | Admitting: Primary Care

## 2019-12-25 DIAGNOSIS — E119 Type 2 diabetes mellitus without complications: Secondary | ICD-10-CM

## 2019-12-25 LAB — COMPREHENSIVE METABOLIC PANEL
ALT: 18 U/L (ref 0–35)
AST: 19 U/L (ref 0–35)
Albumin: 4.4 g/dL (ref 3.5–5.2)
Alk Phos: 62 U/L (ref 35–105)
Anion Gap: 11 (ref 7–16)
Bilirubin,Total: 0.4 mg/dL (ref 0.0–1.2)
CO2: 29 mmol/L — ABNORMAL HIGH (ref 20–28)
Calcium: 9.7 mg/dL (ref 8.6–10.2)
Chloride: 101 mmol/L (ref 96–108)
Creatinine: 0.93 mg/dL (ref 0.51–0.95)
GFR,Black: 75 *
GFR,Caucasian: 65 *
Glucose: 95 mg/dL (ref 60–99)
Lab: 15 mg/dL (ref 6–20)
Potassium: 4.3 mmol/L (ref 3.3–5.1)
Sodium: 141 mmol/L (ref 133–145)
Total Protein: 7.5 g/dL (ref 6.3–7.7)

## 2019-12-25 LAB — LIPID PANEL
Chol/HDL Ratio: 2.4
Cholesterol: 205 mg/dL — AB
HDL: 86 mg/dL — ABNORMAL HIGH (ref 40–60)
LDL Calculated: 104 mg/dL
Non HDL Cholesterol: 119 mg/dL
Triglycerides: 76 mg/dL

## 2019-12-25 LAB — MICROALBUMIN, URINE, RANDOM
Creatinine,UR: 62 mg/dL (ref 20–300)
Microalbumin,UR: <1.2 mg/dL

## 2019-12-26 LAB — HEMOGLOBIN A1C: Hemoglobin A1C: 6.6 % — ABNORMAL HIGH

## 2019-12-29 ENCOUNTER — Encounter: Payer: Self-pay | Admitting: Primary Care

## 2019-12-29 ENCOUNTER — Telehealth: Payer: Self-pay | Admitting: Primary Care

## 2019-12-29 ENCOUNTER — Ambulatory Visit: Payer: Medicare (Managed Care) | Admitting: Primary Care

## 2019-12-29 VITALS — BP 130/66 | HR 69 | Ht 64.0 in | Wt 199.0 lb

## 2019-12-29 DIAGNOSIS — E669 Obesity, unspecified: Secondary | ICD-10-CM

## 2019-12-29 DIAGNOSIS — H9122 Sudden idiopathic hearing loss, left ear: Secondary | ICD-10-CM

## 2019-12-29 DIAGNOSIS — M1710 Unilateral primary osteoarthritis, unspecified knee: Secondary | ICD-10-CM

## 2019-12-29 DIAGNOSIS — M47816 Spondylosis without myelopathy or radiculopathy, lumbar region: Secondary | ICD-10-CM

## 2019-12-29 DIAGNOSIS — N62 Hypertrophy of breast: Secondary | ICD-10-CM

## 2019-12-29 DIAGNOSIS — E119 Type 2 diabetes mellitus without complications: Secondary | ICD-10-CM

## 2019-12-29 DIAGNOSIS — I10 Essential (primary) hypertension: Secondary | ICD-10-CM

## 2019-12-29 DIAGNOSIS — M179 Osteoarthritis of knee, unspecified: Secondary | ICD-10-CM

## 2019-12-29 DIAGNOSIS — E782 Mixed hyperlipidemia: Secondary | ICD-10-CM

## 2019-12-29 DIAGNOSIS — G4700 Insomnia, unspecified: Secondary | ICD-10-CM

## 2019-12-29 DIAGNOSIS — Z0000 Encounter for general adult medical examination without abnormal findings: Secondary | ICD-10-CM

## 2019-12-29 MED ORDER — VERAPAMIL HCL CR 240 MG PO TBCR *I*
240.0000 mg | ORAL_TABLET | Freq: Every evening | ORAL | 5 refills | Status: DC
Start: 2019-12-29 — End: 2020-01-31

## 2019-12-29 NOTE — Telephone Encounter (Signed)
1st attempt    Appt / Follow up  info - 4 weeks for HTN. & 1 year for your Subsequent Annual Medicare Wellness Visit.    Referral info -     TO PLASTIC SURGERY  Where: Division of Plastic Surgery  Address: [address] [place], [place] [address] [place] [address]                Phone: [phone]

## 2019-12-29 NOTE — Patient Instructions (Signed)
Thank you for completing your Annual Exam, Subsequent Annual Medicare Visit, and Lab Results   with [place] today.     The purpose of this visits was to:     Screen for disease   Assess risk of future medical problems   Help develop a healthy lifestyle   Update vaccines   Get to know your doctor in case of an illness    Patient Care Team:  Hetty Ely, MD as PCP - General (Primary Care)  Verita Schneiders, AUD as PCP - CCP-AUDIOLOGY     Medicare 5 Year Plan    The following items were identified as areas of concern during your screening today:  BMI greater than 25 - This is a risk for Heart Attack, Stroke, High Blood Pressure, Diabetes, High Cholesterol and other complications.       The Health Maintenance table below identifies screening tests and immunizations recommended by your health care team:  Health Maintenance: These screening recommendations are based on [place]PSTF, [employer], and [place] state guidelines   Topic Date Due    Shingles Vaccine (2 of 3) 11/08/2012    Diabetic Foot Exam  10/01/2019    HIV Screening  06/29/2020 (Originally 08/16/1969)    Diabetic A1C Monitoring  03/25/2020    Cervical Cancer Screening   03/30/2020    Diabetic Kidney Disease  12/24/2020    DEPRESSION SCREEN YEARLY  12/28/2020    Breast Cancer Screening  06/11/2021    Colon Cancer Screening  06/26/2021    Diabetic Eye Exam  12/06/2021    Flu Shot  Completed    Hepatitis C Screening  Completed    COVID-19 Vaccine  Completed     In addition, goals and orders placed to address these recommendations are listed in the "Today's Visit" section.    We wish you the best of health and look forward to seeing you again next year for your Annual Medicare Wellness Visit.     If you have any health care concerns before then, please do not hesitate to contact us.

## 2019-12-29 NOTE — Addendum Note (Signed)
Addended by: Hetty Ely on: 12/29/2019 02:56 PM     Modules accepted: Orders

## 2019-12-29 NOTE — Progress Notes (Signed)
[facility]  Progress Note    Reason For Visit   Adult Comprehensive Physical Exam.    HPI   Today has concerns of: patient seen for Medicare wellness and for physical.  She states that she does not like the verapamil 360 mg.  She just has not been feeling that good and is unsure exactly what seems to be causing the problem.  She also received her 2nd  dose of Pfizer over 2 weeks ago but did not have any immediate problems from that.  Back has been bothering her.  Feels her breasts are really large and that is a problem.  Afraid to think about surgery but may be she should consider it.  Upper back feels tight and hurts.  Saw orthopedics for her knee and everything there is okay.    The Problem List, MHx, SHx, FHx, Immunizations, Medications, and Allergies were reviewed with the patient today during the appointment and updates were made today.    Allergies   Allergen Reactions    Seafood Itching    Lisinopril Other (See Comments)     Dizziness and cough      Current Outpatient Medications   Medication Sig Dispense Refill    hydroCHLOROthiazide (HYDRODIURIL) 12.5 MG tablet Take 2 tablets (25 mg total) by mouth daily 180 tablet 1    Zinc Sulfate (ZINC-220 PO) Take by mouth      Magnesium Oxide 500 MG TABS Take 1 tablet by mouth daily      Potassium 99 MG TABS Take by mouth       blood glucose test strip By no specified route 4 times daily Test 3-4 times a day. Freestyle Freedom. Dx: DM-2 100 strip 5    metFORMIN (GLUCOPHAGE) 500 MG tablet Take 1 tablet (500 mg total) by mouth 2 times daily (with meals) 180 tablet 1    TURMERIC PO Take 1 tablet by mouth daily      blood glucose monitor (FREESTYLE FREEDOM LITE) kit Use daily for BG testing      Non-System Medication Abbott freestyle lifestyle touch dx: DM-2      Non-System Medication BP cuff, large Dx: HTN      cholecalciferol (VITAMIN D) 1000 UNIT tablet Take 2,000 Units by mouth daily         verapamil (CALAN-SR) 240 MG CR tablet Take 1 tablet  (240 mg total) by mouth nightly Swallow whole. Do not crush, break, or chew. 30 tablet 5    ALPRAZolam (XANAX) 0.25 MG tablet Take 1 tablet (0.25 mg total) by mouth 2 times daily as needed for Anxiety Max daily dose: 0.5 mg 60 tablet 0    nystatin (MYCOSTATIN) ointment Apply topically 2 times daily as needed 30GM Use topically twice daily on affected area[s] 30 g 2    Melatonin 3 MG CAPS Take 3 mg by mouth nightly as needed      amoxicillin (AMOXIL) 500 MG capsule Prior to dental procedures       No current facility-administered medications for this visit.     Patient Active Problem List    Diagnosis Date Noted    Lumbar spondylosis 09/29/2018    Chronic left-sided low back pain with left-sided sciatica 12/16/2017    Hyperlipidemia, mixed 03/17/2017    Sudden hearing loss 02/25/2017    Obesity (BMI 30-39.9) 02/25/2017    Type 2 diabetes mellitus 02/25/2017    Carpal tunnel syndrome 02/25/2017    HTN (hypertension) 02/25/2017    Osteoarthritis, knee 02/25/2017  Presence of right artificial knee joint 12/25/2015     Past Medical History:   Diagnosis Date    Anxiety     Diabetes     Diabetes mellitus     High blood pressure     Hypertension      Past Surgical History:   Procedure Laterality Date    BREAST BIOPSY  2008    CHOLECYSTECTOMY, LAPAROSCOPIC      JOINT REPLACEMENT      left TKR    KNEE REPLACEMENT  2008/2017    x2     Family History   Problem Relation Age of Onset    Breast cancer Mother     Diabetes Sister     Hypertension Sister     Diabetes Maternal Uncle     Coronary artery disease Maternal Uncle     Prostate cancer Father     Dementia Father      Social History     Socioeconomic History    Marital status: Widowed     Spouse name: Not on file    Number of children: Not on file    Years of education: Not on file    Highest education level: Not on file   Tobacco Use    Smoking status: Never Smoker    Smokeless tobacco: Never Used   Substance and Sexual Activity     Alcohol use: No    Drug use: No    Sexual activity: Not Currently   Other Topics Concern    Not on file   Social History Narrative    Not on file        ROS   Review of Systems   Constitutional: Negative for chills, fever, malaise/fatigue and weight loss.   HENT: Negative for sore throat.    Respiratory: Negative for cough, shortness of breath and wheezing.    Cardiovascular: Negative for chest pain and palpitations.   Gastrointestinal: Negative for abdominal pain, constipation, diarrhea, heartburn, nausea and vomiting.   Genitourinary: Negative for dysuria.   Musculoskeletal: Positive for back pain and joint pain.   Skin: Negative for rash.   Neurological: Negative for dizziness and headaches.   Psychiatric/Behavioral: Negative for depression. The patient is not nervous/anxious.        Vitals  Blood pressure 134/68, pulse 69, height 1.626 m (_0 ), weight 90.3 kg (199 lb), SpO2 98 %.    Physical Exam  Physical Exam  Vitals and nursing note reviewed.   Constitutional:       General: She is not in acute distress.     Appearance: She is not diaphoretic.      Comments: Pleasant lady in no distress, wearing a mask   HENT:      Head: Normocephalic and atraumatic.      Right Ear: Tympanic membrane, ear canal and external ear normal.      Left Ear: Tympanic membrane, ear canal and external ear normal.      Nose: Nose normal.      Mouth/Throat:      Mouth: Mucous membranes are moist.      Pharynx: Oropharynx is clear. No oropharyngeal exudate.   Eyes:      General: No scleral icterus.        Right eye: No discharge.         Left eye: No discharge.      Conjunctiva/sclera: Conjunctivae normal.      Pupils: Pupils are equal, round, and reactive to light.  Neck:      Thyroid: No thyromegaly.   Cardiovascular:      Rate and Rhythm: Normal rate and regular rhythm.      Heart sounds: Normal heart sounds. No murmur heard.   No gallop.    Pulmonary:      Effort: Pulmonary effort is normal. No respiratory distress.      Breath  sounds: Normal breath sounds. No wheezing or rales.   Abdominal:      General: There is no distension.      Palpations: Abdomen is soft. There is no mass.      Tenderness: There is no abdominal tenderness. There is no guarding or rebound.   Musculoskeletal:         General: Normal range of motion.      Cervical back: Normal range of motion and neck supple.        Back:       Comments: Deep marks on both shoulders made from the bra   Lymphadenopathy:      Cervical: No cervical adenopathy.   Skin:     General: Skin is warm and dry.   Neurological:      Mental Status: She is alert and oriented to person, place, and time.      Cranial Nerves: No cranial nerve deficit.   Psychiatric:         Mood and Affect: Mood and affect normal.         Lab Results  Lab Results   Component Value Date    WBC 5.0 10/23/2019    HGB 12.6 10/23/2019    HCT 39 10/23/2019    MCV 89 10/23/2019    PLT 292 10/23/2019       Chemistry        Lab results: 12/25/19  1519   Sodium 141   Potassium 4.3   Chloride 101   CO2 29*   GFR,Caucasian 65   GFR,Black 75   UN 15   Creatinine 0.93        Lab results: 12/25/19  1519   Glucose 95   Calcium 9.7   Total Protein 7.5   Albumin 4.4   ALT 18   AST 19   Alk Phos 62   Bilirubin,Total 0.4          Lab Results   Component Value Date    CHOL 205 (!) 12/25/2019    HDL 86 (H) 12/25/2019    LDLC 104 12/25/2019    TRIG 76 12/25/2019    CHHDC 2.4 12/25/2019     Lab Results   Component Value Date    TSH 0.79 03/23/2019     Hemoglobin A1C   Date Value Ref Range Status   12/25/2019 6.6 (H) % Final     Comment:     Ref Range <=5.6  HbA1c values of 5.7-6.4% indicate an increased risk for developing  diabetes mellitus.  HbA1c values greater than or equal to 6.5% are diagnostic of  diabetes mellitus.  For diagnosis of diabetes in individuals without unequivocal  hyperglycemia, results should be confirmed by repeat testing.       EKG done today shows normal sinus rhythm at 57 bpm.  Normal P axis.  No ST-T  changes.    Assessment   This is a 64 y.o.female who comes in today for a physical exam    1. Preventative health care  Reviewed healthy lifestyle measures. Up to date on routine preventive care and immunizations. Reviewed  current guidelines re : nutrition ,sleep ,hydration and exercise.  Patient plans to defer on the Shingrix vaccination for now.  Reviewed risks and benefits with her.    2. Essential hypertension  She does not like the new verapamil prescription.  Thinks the previous one at 240 mg worked better for her.  She will stay on the hydrochlorothiazide alone for the next 2 to 3 days and start taking Calan SR starting Monday.  Goal blood pressure of 130/90 or less.  Labs reviewed with patient.  Follow low-salt diet.  Exercise 30 minutes most days of the week.  Continue to work on weight loss.  Check blood pressures 2-3 times per week.  Reassess in 1 month    3. Type 2 diabetes mellitus  A1c appears stable.  Continue Metformin at current dosage.  Continue to work on weight loss.  Continue to follow a low-carb diet and exercise 30 minutes most days of the week.    4. Hyperlipidemia, mixed  Continue to follow a low-cholesterol diet and exercise 30 minutes most days of the week.. I reviewed most recent labs again with patient.  Has refused statins in the past.    5. Obesity (BMI 30-39.9)  Continue to work on weight loss    6. Insomnia, unspecified type  Supportive care    7. Lumbar spondylosis  Has seen spine clinic in the past.  Supportive care    8. Large breasts  Referral done to plastic surgery.    9. Sudden left hearing loss  Supportive care.    10. Osteoarthritis, knee  Managed by orthopedics.  Continue to monitor.  Notes reviewed.    11. Routine medical exam  See plan under preventive care.        Follow up: Return in about 4 weeks (around 01/26/2020) for Follow-up, HTN.    Author: Earna Coder, MD  Note created: 12/29/2019  at: 2:31 PM

## 2019-12-29 NOTE — Progress Notes (Signed)
Visit performed as:             Office Visit, met with patient in person    Today we reviewed and updated Robin Green smoking status, activities of daily living, depression screen, fall risk, medications and allergies.   I have counseled the patient in the above areas.     Subjective:     Chief Complaint: Robin Green is a 64 y.o. female here for a/an Annual Exam, Subsequent Annual Medicare Visit, and Lab Results    In general, Robin Green rates their overall health as:  fair      Patient Care Team:  Earna Coder, MD as PCP - General (Primary Care)  Noah Delaine, AUD as PCP - CCP-AUDIOLOGY     Current Outpatient Medications on File Prior to Visit   Medication Sig Dispense Refill    verapamil (VERELAN) 180 MG 24 hr capsule Take 2 capsules (360 mg total) by mouth nightly 180 capsule 1    hydroCHLOROthiazide (HYDRODIURIL) 12.5 MG tablet Take 2 tablets (25 mg total) by mouth daily 180 tablet 1    Zinc Sulfate (ZINC-220 PO) Take by mouth      Magnesium Oxide 500 MG TABS Take 1 tablet by mouth daily      Potassium 99 MG TABS Take by mouth       blood glucose test strip By no specified route 4 times daily Test 3-4 times a day. Freestyle Freedom. Dx: DM-2 100 strip 5    metFORMIN (GLUCOPHAGE) 500 MG tablet Take 1 tablet (500 mg total) by mouth 2 times daily (with meals) 180 tablet 1    TURMERIC PO Take 1 tablet by mouth daily      blood glucose monitor (FREESTYLE FREEDOM LITE) kit Use daily for BG testing      Non-System Medication Abbott freestyle lifestyle touch dx: DM-2      Non-System Medication BP cuff, large Dx: HTN      cholecalciferol (VITAMIN D) 1000 UNIT tablet Take 2,000 Units by mouth daily         ALPRAZolam (XANAX) 0.25 MG tablet Take 1 tablet (0.25 mg total) by mouth 2 times daily as needed for Anxiety Max daily dose: 0.5 mg 60 tablet 0    nystatin (MYCOSTATIN) ointment Apply topically 2 times daily as needed 30GM Use topically twice daily on affected area[s] 30 g 2    Melatonin  3 MG CAPS Take 3 mg by mouth nightly as needed      amoxicillin (AMOXIL) 500 MG capsule Prior to dental procedures       No current facility-administered medications on file prior to visit.     Allergies   Allergen Reactions    Seafood Itching    Lisinopril Other (See Comments)     Dizziness and cough      Patient Active Problem List    Diagnosis Date Noted    Lumbar spondylosis 09/29/2018    Chronic left-sided low back pain with left-sided sciatica 12/16/2017    Hyperlipidemia, mixed 03/17/2017    Sudden hearing loss 02/25/2017    Obesity (BMI 30-39.9) 02/25/2017    Type 2 diabetes mellitus 02/25/2017    Carpal tunnel syndrome 02/25/2017    HTN (hypertension) 02/25/2017    Osteoarthritis, knee 02/25/2017    Presence of right artificial knee joint 12/25/2015     Past Medical History:   Diagnosis Date    Anxiety     Diabetes     Diabetes mellitus  High blood pressure     Hypertension      Past Surgical History:   Procedure Laterality Date    BREAST BIOPSY  2008    CHOLECYSTECTOMY, LAPAROSCOPIC      JOINT REPLACEMENT      left TKR    KNEE REPLACEMENT  2008/2017    x2     Family History   Problem Relation Age of Onset    Breast cancer Mother     Diabetes Sister     Hypertension Sister     Diabetes Maternal Uncle     Coronary artery disease Maternal Uncle     Prostate cancer Father     Dementia Father      Social History     Socioeconomic History    Marital status: Widowed     Spouse name: Not on file    Number of children: Not on file    Years of education: Not on file    Highest education level: Not on file   Occupational History    Not on file   Tobacco Use    Smoking status: Never Smoker    Smokeless tobacco: Never Used   Substance and Sexual Activity    Alcohol use: No    Drug use: No    Sexual activity: Not Currently   Social History Narrative    Not on file       Objective:     Vital Signs: BP 134/68    Pulse 69    Ht 1.626 m ('5\' 4"' )    Wt 90.3 kg (199 lb)    SpO2 98%     BMI 34.16 kg/m    BMI: Body mass index is 34.16 kg/m.    Vision Screening Results (Welcome visit only):  No exam data present    Depression Screening Results:  Recent Review Flowsheet Data     PHQ Scores 12/29/2019 12/29/2018 03/17/2017    PSQ2 Q1 - Interest/Pleasure N N N    PSQ2 Q2 - Down, Depressed, Hopeless N N N        Opioid Use/DAST- 10 Screening Results:   How many times in the past year have you used an illegal drug or used a prescription medication for nonmedical reasons?: 0 (12/29/2019  1:41 PM)    Activities of Daily Living/Functional Screening Results:  Is the person deaf or does he/she have serious difficulty hearing?: N  *Vision Status: Visual aid   Does this person have serious difficulty walking or climbing stairs?: N  Does this person have difficulty dressing or bathing?: N  *Shopping: Independent  *House Keeping: Independent  *Managing Own Medications: Independent  *Handling Finances: Independent  Difficulty doing errands due to a physicial, mental or emotional condition: No  Difficulty remembering or making decisions due to a physicial, mental or emotional condition: No      Fall Risk Screening Results:  Have you fallen in the last year?: No  Do you feel you are at risk for falling?: No      Assessment and Plan:     Cognitive Function:  Recall of recent and remote events appears:  Normal      Advanced Care Planning:  was discussed and patient received paperwork to review     The following health maintenance plan was reviewed with the patient:    Health Maintenance Topics with due status: Overdue       Topic Date Due    IMM-ZOSTER 11/08/2012    Diabetic Foot Exam ADA  10/01/2019     Health Maintenance Topics with due status: Postponed       Topic Postponed Until    HIV Screening USPSTF/[facility] 06/29/2020 (Originally 08/16/1969)     Health Maintenance Topics with due status: Not Due       Topic Last Completion Date    Colon Cancer Screening Other 06/26/2016    Cervical Cancer Screening 03/30/2017    Breast  Cancer Screening Other 06/12/2019    Diabetic Eye Exam ADA 12/07/2019    Diabetic Nephropathy Screening no ACE/ARB 12/25/2019    Diabetic A1C Monitoring Other 12/25/2019    DEPRESSION SCREEN YEARLY 12/29/2019     Health Maintenance Topics with due status: Completed       Topic Last Completion Date    Hepatitis C Screening 06/10/2016    IMM-INFLUENZA 06/30/2019    COVID-19 Vaccine 12/12/2019     This health maintenance schedule, identified risks, a list of orders placed today and patient goals have been provided to Robin Green in the after visit summary.     Plan for any concerns identified during screening or risk assessments:  n/a

## 2020-01-01 NOTE — Telephone Encounter (Signed)
Pt returned our call and she is scheduled for a 4 week F/u per JE and also for next year's [facility] also is aware of Referral

## 2020-01-24 ENCOUNTER — Ambulatory Visit: Payer: Medicare (Managed Care) | Admitting: Primary Care

## 2020-01-29 NOTE — Progress Notes (Signed)
[place]     Robin Green is a 64 y.o. female who presents for Hypertension and Pain (left shoulder)    Patient seen for follow-up of hypertension.  At her last visit she was continued on hydrochlorothiazide but switched back to verapamil.  Patient states that the hydrochlorothiazide causes side effects and she does not want to stay on that.  Gives her some back pain.  She does plan to follow-up with her prior spine clinic for follow-up of back pain which comes and goes.  Patient also states that now her left shoulder has been hurting and she would like a referral for physical therapy for the same.      Review of Systems   Constitutional: Negative for chills, fever and weight loss.   HENT: Negative for sore throat.    Respiratory: Negative for cough, shortness of breath and wheezing.    Cardiovascular: Negative for chest pain and palpitations.   Musculoskeletal: Positive for joint pain.   Skin: Negative for rash.       MEDICATIONS     Current Outpatient Medications   Medication Sig    COD LIVER OIL PO Take by mouth    verapamil (CALAN-SR) 240 MG CR tablet Take 1 tablet (240 mg total) by mouth 2 times daily Swallow whole. Do not crush, break, or chew.    Zinc Sulfate (ZINC-220 PO) Take by mouth    ALPRAZolam (XANAX) 0.25 MG tablet Take 1 tablet (0.25 mg total) by mouth 2 times daily as needed for Anxiety Max daily dose: 0.5 mg    Magnesium Oxide 500 MG TABS Take 1 tablet by mouth daily    Potassium 99 MG TABS Take by mouth     blood glucose test strip By no specified route 4 times daily Test 3-4 times a day. Freestyle Freedom. Dx: DM-2    metFORMIN (GLUCOPHAGE) 500 MG tablet Take 1 tablet (500 mg total) by mouth 2 times daily (with meals)    TURMERIC PO Take 1 tablet by mouth daily    nystatin (MYCOSTATIN) ointment Apply topically 2 times daily as needed 30GM Use topically twice daily on affected area[s]    Melatonin 3 MG CAPS Take 3 mg by mouth nightly as  needed    amoxicillin (AMOXIL) 500 MG capsule Prior to dental procedures    blood glucose monitor (FREESTYLE FREEDOM LITE) kit Use daily for BG testing    Non-System Medication Abbott freestyle lifestyle touch dx: DM-2    Non-System Medication BP cuff, large Dx: HTN    cholecalciferol (VITAMIN D) 1000 UNIT tablet Take 2,000 Units by mouth daily       metaxalone (SKELAXIN) 800 MG tablet Take 1 tablet (800 mg total) by mouth 3 times daily       Patient's medications were reviewed and updated at today's visit and no changes were made    ALLERGIES     Seafood and Lisinopril      Patient's allergies were reviewed and updated at today's visit and no changes were made    PROBLEM LIST     Patient Active Problem List   Diagnosis Code    Sudden hearing loss H91.20    Obesity (BMI 30-39.9) E66.9    Type 2 diabetes mellitus E11.9    Carpal tunnel syndrome G56.00    HTN (hypertension) I10    Osteoarthritis, knee M17.10    Presence of right artificial knee joint Z96.651    Hyperlipidemia,  mixed E78.2    Chronic left-sided low back pain with left-sided sciatica M54.42, G89.29    Lumbar spondylosis M47.816         TOBACCO USE HISTORY     Social History     Tobacco Use   Smoking Status Never Smoker   Smokeless Tobacco Never Used       PHYSICAL EXAM     BP 160/90    Pulse 80    Resp 14    Wt 91.3 kg (201 lb 3.2 oz)    SpO2 98%    BMI 34.54 kg/m         Physical Exam  Vitals reviewed.   Constitutional:       General: She is not in acute distress.     Comments: Pleasant lady in no distress, wearing a mask   HENT:      Head: Normocephalic and atraumatic.      Right Ear: External ear normal.      Left Ear: External ear normal.      Nose: Nose normal.   Eyes:      General: No scleral icterus.        Right eye: No discharge.         Left eye: No discharge.      Conjunctiva/sclera: Conjunctivae normal.   Cardiovascular:      Rate and Rhythm: Normal rate.      Heart sounds: Normal heart sounds. No murmur heard.   No gallop.     Pulmonary:      Effort: Pulmonary effort is normal. No respiratory distress.      Breath sounds: Normal breath sounds. No wheezing or rales.   Musculoskeletal:      Left shoulder: No swelling, deformity, effusion, laceration, tenderness or bony tenderness. Decreased range of motion. Normal strength.   Neurological:      Mental Status: She is alert.           RECENT LABS   No results found for this or any previous visit (from the past 336 hour(s)).         ASSESSMENT/PLAN     1. Essential hypertension  Recommend increasing verapamil to 40 mg a day.  Risks and benefits reviewed with patient.  Goal blood pressure of 130/90 or less.  Follow low-salt diet and exercise 30 minutes most days of the week.  Reassess in 1 month or sooner as needed.    2. Shoulder pain, left  Referral done to physical therapy for further evaluation and management of muscular pain as well as concerns for rotator cuff disorder.  Further management based on results  - AMB REFERRAL TO PHYSICAL / OCCUPATIONAL THERAPY    Return in about 4 weeks (around 02/28/2020) for Follow-up.    --Patient instructed to call if symptoms are not improving or worsening  --Follow-up arranged    Signed: Earna Coder, MD on 01/31/2020 at 4:06 PM

## 2020-01-31 ENCOUNTER — Ambulatory Visit: Payer: Medicare (Managed Care) | Admitting: Primary Care

## 2020-01-31 ENCOUNTER — Encounter: Payer: Self-pay | Admitting: Primary Care

## 2020-01-31 VITALS — BP 160/90 | HR 80 | Resp 14 | Wt 201.2 lb

## 2020-01-31 DIAGNOSIS — I10 Essential (primary) hypertension: Secondary | ICD-10-CM

## 2020-01-31 DIAGNOSIS — M25512 Pain in left shoulder: Secondary | ICD-10-CM

## 2020-01-31 MED ORDER — VERAPAMIL HCL CR 240 MG PO TBCR *I*
240.0000 mg | ORAL_TABLET | Freq: Two times a day (BID) | ORAL | 5 refills | Status: DC
Start: 2020-01-31 — End: 2020-03-01

## 2020-01-31 MED ORDER — METAXALONE 800 MG PO TABS *I*
800.0000 mg | ORAL_TABLET | Freq: Three times a day (TID) | ORAL | 1 refills | Status: DC
Start: 2020-01-31 — End: 2020-02-02

## 2020-02-01 ENCOUNTER — Ambulatory Visit: Payer: Medicare (Managed Care) | Admitting: Surgery

## 2020-02-01 ENCOUNTER — Telehealth: Payer: Self-pay | Admitting: Primary Care

## 2020-02-01 NOTE — Telephone Encounter (Signed)
Last appointment: 01/31/2020  Next appointment: 03/01/2020        Metaxalone 800 mg tablets are not covered please advise

## 2020-02-02 MED ORDER — TIZANIDINE HCL 2 MG PO CAPS *A*
2.0000 mg | ORAL_CAPSULE | Freq: Three times a day (TID) | ORAL | 1 refills | Status: DC
Start: 2020-02-02 — End: 2020-03-01

## 2020-02-02 NOTE — Telephone Encounter (Signed)
Spoke to pharmacist and it was 170 dollars for 30 days she said any other muscle relaxer is usually cheaper and covered the metaxalone isn't. Please advise

## 2020-02-02 NOTE — Telephone Encounter (Signed)
Can try tizanidine. escripted

## 2020-02-20 ENCOUNTER — Encounter: Payer: Self-pay | Admitting: Gastroenterology

## 2020-02-20 DIAGNOSIS — M25512 Pain in left shoulder: Secondary | ICD-10-CM | POA: Insufficient documentation

## 2020-02-25 NOTE — Progress Notes (Signed)
[facility]     HISTORY OF PRESENT ILLNESS     Robin Green is a 64 y.o. female who presents for Hypertension    Seen for follow up of HTN. Was on verapamil and did not like how it made her feel. She went back to HCTZ. Bp was high when she was seen by plastic surgeon. She is feeling fine. Saw plastic surgeon to consider breast reduction surgery due to back pain.     Interim history reviewed    Review of Systems   Constitutional: Negative for chills, fever and weight loss.   HENT: Negative for sore throat.    Respiratory: Negative for cough, shortness of breath and wheezing.    Cardiovascular: Negative for chest pain and palpitations.   Musculoskeletal: Positive for back pain.   Psychiatric/Behavioral: Negative for depression. The patient is not nervous/anxious.        MEDICATIONS     Current Outpatient Medications   Medication Sig    Ascorbic Acid (VITAMIN C) 500 MG CAPS Take 1,000 mg by mouth     CINNAMON PO Take by mouth    COD LIVER OIL PO Take by mouth    Zinc Sulfate (ZINC-220 PO) Take by mouth    ALPRAZolam (XANAX) 0.25 MG tablet Take 1 tablet (0.25 mg total) by mouth 2 times daily as needed for Anxiety Max daily dose: 0.5 mg    Magnesium Oxide 500 MG TABS Take 1 tablet by mouth daily    Potassium 99 MG TABS Take by mouth     blood glucose test strip By no specified route 4 times daily Test 3-4 times a day. Freestyle Freedom. Dx: DM-2    metFORMIN (GLUCOPHAGE) 500 MG tablet Take 1 tablet (500 mg total) by mouth 2 times daily (with meals)    TURMERIC PO Take 1 tablet by mouth daily    nystatin (MYCOSTATIN) ointment Apply topically 2 times daily as needed 30GM Use topically twice daily on affected area[s]    amoxicillin (AMOXIL) 500 MG capsule Prior to dental procedures    blood glucose monitor (FREESTYLE FREEDOM LITE) kit Use daily for BG testing    Non-System Medication Abbott freestyle lifestyle touch dx: DM-2    Non-System Medication BP cuff, large Dx: HTN    cholecalciferol  (VITAMIN D) 1000 UNIT tablet Take 2,000 Units by mouth daily       NIFEdipine (PROCARDIA XL) 30 MG 24 hr tablet Take 1 tablet (30 mg total) by mouth daily Swallow whole. Do not crush, break, or chew.    Melatonin 3 MG CAPS Take 3 mg by mouth nightly as needed (Patient not taking: Reported on 03/01/2020)       Patient's medications were reviewed and updated at today's visit and no changes were made    ALLERGIES     Seafood and Lisinopril      Patient's allergies were reviewed and updated at today's visit and no changes were made    PROBLEM LIST     Patient Active Problem List   Diagnosis Code    Sudden hearing loss H91.20    Obesity (BMI 30-39.9) E66.9    Type 2 diabetes mellitus E11.9    Carpal tunnel syndrome G56.00    HTN (hypertension) I10    Osteoarthritis, knee M17.10    Presence of right artificial knee joint Z96.651    Hyperlipidemia, mixed E78.2    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   Constitutional:       General: She is not in acute distress.     Comments: Pleasant lady in no distress, wearing a mask   HENT:      Head: Normocephalic and atraumatic.      Right Ear: External ear normal.      Left Ear: External ear normal.      Nose: Nose normal.   Eyes:      General: No scleral icterus.        Right eye: No discharge.         Left eye: No discharge.      Conjunctiva/sclera: Conjunctivae normal.   Cardiovascular:      Rate and Rhythm: Normal rate and regular rhythm.      Heart sounds: Normal heart sounds. No murmur heard.     Pulmonary:      Effort: Pulmonary effort is normal. No respiratory distress.      Breath sounds: Normal breath  sounds. No stridor. No wheezing or rales.   Neurological:      Mental Status: She is alert.   Psychiatric:         Mood and Affect: Mood normal.           RECENT LABS   No results found for this or any previous visit (from the past 336 hour(s)).         ASSESSMENT/PLAN     1. Essential hypertension    Bp is not well controlled. HCTZ is not working at maximum dose. Recommend that she stop HCTZ. Start nifedipine. Risks benefits side effects reviewed with patient. Goal BP of 130/80 or less. Reassess in 1 month. Continue to work on wt loss.     Return in about 4 weeks (around 03/29/2020) for Follow-up, HTN.    --Patient instructed to call if symptoms are not improving or worsening  --Follow-up arranged    Signed: Earna Coder, MD on 03/01/2020 at 10:25 PM

## 2020-02-29 ENCOUNTER — Encounter: Payer: Self-pay | Admitting: Surgery

## 2020-02-29 ENCOUNTER — Ambulatory Visit: Payer: Medicare (Managed Care) | Admitting: Surgery

## 2020-02-29 VITALS — BP 162/83 | HR 78 | Temp 98.2°F | Ht 64.5 in | Wt 203.0 lb

## 2020-02-29 DIAGNOSIS — N62 Hypertrophy of breast: Secondary | ICD-10-CM

## 2020-02-29 NOTE — H&P (Signed)
Plastic Surgery  History and Physical      Name: Robin Green, Robin Green  MRN: [id number]  DOB: [birth year]  Age: 64 y.o.   Gender: female   Ethnicity: Black or African American [2]      Date: 02/29/2020      Surgeon: Fabio Pierce, MD  Provider: Sharlyne Cai, NP      Referring Provider: Elsie Stain, MD  PCP: Hetty Ely, MD      Chief Complaint    Heavy breasts and back pain.      History of Present Illness   Robin Green is a 64 y.o. female seeking surgical reduction of Robin Green breasts. Robin Green currently wears a brassiere size 44 DD (E) and desires a C cup size postoperatively. Robin Green breasts' size has been stable over the last 6 months. Robin Green body weight has been stable over the last 3 months.    Robin Green denies back pain.    Robin Green reports neck and shoulder pain, experiencing it most of the time, with a maximum intensity of 7/10. Robin Green has experienced neck and shoulder pain for approximately 5 years.    Robin Green reports breast pain, experiencing it most of the time, with a maximum intensity of 7/10. Robin Green has experienced breast pain for approximately 5 years.    Unsuccessful previous measures for pain control include anti-inflammatory drugs (NSAIDs), massage therapy and physical therapy.    Robin Green denies intertriginous rash at the inframammary folds.   Robin Green denies nipple discharge. Robin Green denies nodules in Robin Green breasts.    Robin Green denies previous breast disease.    Robin Green reports previous breast surgery. Biopsy x2 to left breasts    Robin Green has has not attempted breast feeding in the past. Robin Green reports no plans of further pregnancies in the future.    Robin Green reports family history of breast cancer. Mother    Robin Green reports having had a mammogram in the past. The last study was in 06/2019.       Review of Systems     Musculoskeletal: Positive for neck pain. Negative for back pain.    Skin: Negative for rash.    All other systems reviewed and are negative.       Past Medical History   Robin Green  has a past medical history of Anxiety, Diabetes, Diabetes mellitus, High  blood pressure, and Hypertension.   Robin Green has Sudden hearing loss; Obesity (BMI 30-39.9); Type 2 diabetes mellitus; Carpal tunnel syndrome; HTN (hypertension); Osteoarthritis, knee; Presence of right artificial knee joint; Hyperlipidemia, mixed; Chronic left-sided low back pain with left-sided sciatica; and Lumbar spondylosis on their problem list.       Past Surgical History   Robin Green has a past surgical history that includes joint replacement; Cholecystectomy, laparoscopic; knee replacement (2008/2017); and Breast biopsy (2008).       Obstetric History   No obstetric history on file.      Allergies   Robin Green is allergic to seafood and lisinopril.       Medications   Robin Green has a current medication list which includes the following prescription(s): tizanidine, cod liver oil, verapamil, zinc sulfate, alprazolam, magnesium oxide, potassium, blood glucose, turmeric, nystatin, melatonin, amoxicillin, freestyle freedom lite, non-system medication, non-system medication, cholecalciferol, and metformin.        Social History   Robin Green  reports that Robin Green has never smoked. Robin Green has never used smokeless tobacco. Robin Green reports previously being sexually active. Robin Green reports that Robin Green does not drink alcohol and does not use drugs.  Family History   Robin Green family history includes Breast cancer in Robin Green mother; Coronary artery disease in Robin Green maternal uncle; Dementia in Robin Green father; Diabetes in Robin Green maternal uncle and sister; Hypertension in Robin Green sister; Prostate cancer in Robin Green father.      Vital Signs  BP 162/83    Pulse 78    Temp 36.8 C (98.2 F) (Infrared)    Ht 1.638 m (5' 4.5")    Wt 92.1 kg (203 lb)    SpO2 97%    BMI 34.31 kg/m   There were no vitals filed for this visit.      Physical Exam    Constitutional: Robin Green is alert. Not distressed. Robin Green appears well hydrated and not pale.    Head: Normocephalic.    Cardiovascular: Normal rate and regular rhythm.    Pulmonary: Effort normal.    Breasts: Robin Green breasts are asymmetrical. The left breast is slightly  larger than the right. Robin Green breasts have colors within defined limits. Shoulder grooves are present.    Right Breast: Large size. Ptosis grade 3. Negative for abnormal contour, skin change, inverted nipple, tenderness, nipple discharge and rash in inframammary fold. Inspection reveals no scar. No palpable mass.    Left Breast: Large size. Ptosis grade 3. Negative for abnormal contour, skin change, inverted nipple, tenderness, nipple discharge and rash in inframammary fold. Inspection reveals no scar. No palpable mass.     Breast Measurements:                     Right Left     Sternal notch to nipple (cm)         42 41    Nipple to inframammary fold (cm)  16 16    Breast base width (cm)                      27 26   Neurological: Robin Green is alert and oriented to person, place, and time.    Psychiatric: Robin Green has a normal mood and affect. Behavior normal.    Skin: Skin is warm. No jaundice or pallor.    A chaperone was present in the room.       Assessment and Plan   Robin Green is a 64 y.o. female seeking reduction mammaplasty. During our encounter we discussed several aspects of breast reduction surgery, including but not limited to indications, postoperative care, physical activity restrictions, risks, alternatives, benefits, expected outcomes, surgical technique and scar placement. We went over diagrams illustrating the procedure and scar placement. Robin Green was shown pre and postoperative photographs relevant to Robin Green case. Robin Green was provided with an informational pamphlet by the [employer] [facility] [employer] on breast reduction surgery.    I believe this patient has symptomatic macromastia and is a good candidate for reduction mammaplasty. An estimated combined weight of 800 grams or more could be removed from Robin Green breasts. Robin Green understands that the primary goal of surgery is to alleviate Robin Green symptoms and that postoperative breast size cannot be guaranteed. However, most patients range from a large B to a C cup  postoperatively.    We will submit this case to the patient's medical insurance company for preoperative authorization as soon as possible.    Robin Green reports having had a screening mammogram. Robin Green last mammographic study was in 06/2019 (recommended follow up: 12 months). This report was reviewed. Robin Green screening status is up to date.     We discussed weight loss prior to surgery would  benefit Robin Green greatly. Robin Green last HbA1C was 6.6 3 months ago, I think it would be okay to proceed with surgery but an updated HbA1C closure to surgery would be recommended.          We thank Dr. Rennis Harding for the kind referral.       Sharlyne Cai, NP

## 2020-03-01 ENCOUNTER — Encounter: Payer: Self-pay | Admitting: Primary Care

## 2020-03-01 ENCOUNTER — Ambulatory Visit: Payer: Medicare (Managed Care) | Admitting: Primary Care

## 2020-03-01 VITALS — BP 160/90 | HR 60 | Resp 12 | Wt 202.0 lb

## 2020-03-01 DIAGNOSIS — I10 Essential (primary) hypertension: Secondary | ICD-10-CM

## 2020-03-01 MED ORDER — NIFEDIPINE CR OSMOTIC 30 MG PO TB24 *I*
30.0000 mg | ORAL_TABLET | Freq: Every day | ORAL | 5 refills | Status: DC
Start: 2020-03-01 — End: 2020-04-01

## 2020-03-06 ENCOUNTER — Telehealth: Payer: Self-pay | Admitting: Primary Care

## 2020-03-06 NOTE — Telephone Encounter (Signed)
nifedipine 30 mg, to lower the copay for this medication, Monia Pouch [phone] Pt states that the PCP needs to call this number

## 2020-03-07 NOTE — Telephone Encounter (Signed)
Medication is going to be approved will be receiving a fax with information on it. Pt was called and message left

## 2020-03-08 NOTE — Telephone Encounter (Addendum)
Nifedipine ER Osmotic release tablet. Approved fom 09/01/2019-12/31/202.    VM left advising  pt as well.

## 2020-03-13 ENCOUNTER — Encounter: Payer: Self-pay | Admitting: Gastroenterology

## 2020-03-19 ENCOUNTER — Other Ambulatory Visit: Payer: Self-pay | Admitting: Surgery

## 2020-03-19 DIAGNOSIS — Z01818 Encounter for other preprocedural examination: Secondary | ICD-10-CM

## 2020-03-19 DIAGNOSIS — N62 Hypertrophy of breast: Secondary | ICD-10-CM

## 2020-03-27 ENCOUNTER — Other Ambulatory Visit: Payer: Self-pay | Admitting: Gastroenterology

## 2020-03-28 ENCOUNTER — Encounter: Payer: Self-pay | Admitting: Foot & Ankle Surgery

## 2020-03-29 ENCOUNTER — Ambulatory Visit: Payer: Medicare (Managed Care) | Admitting: Foot & Ankle Surgery

## 2020-03-29 DIAGNOSIS — R234 Changes in skin texture: Secondary | ICD-10-CM

## 2020-03-29 DIAGNOSIS — E119 Type 2 diabetes mellitus without complications: Secondary | ICD-10-CM

## 2020-04-01 ENCOUNTER — Encounter: Payer: Self-pay | Admitting: Primary Care

## 2020-04-01 ENCOUNTER — Other Ambulatory Visit: Payer: Self-pay | Admitting: Primary Care

## 2020-04-01 ENCOUNTER — Encounter: Payer: Self-pay | Admitting: Foot & Ankle Surgery

## 2020-04-01 DIAGNOSIS — E119 Type 2 diabetes mellitus without complications: Secondary | ICD-10-CM

## 2020-04-01 DIAGNOSIS — I10 Essential (primary) hypertension: Secondary | ICD-10-CM

## 2020-04-01 MED ORDER — NIFEDIPINE CR OSMOTIC 30 MG PO TB24 *I*
30.0000 mg | ORAL_TABLET | Freq: Every day | ORAL | 1 refills | Status: DC
Start: 2020-04-01 — End: 2020-07-12

## 2020-04-01 MED ORDER — AMMONIUM LACTATE 12 % EX CREA *I*
TOPICAL_CREAM | Freq: Two times a day (BID) | CUTANEOUS | 1 refills | Status: DC | PRN
Start: 2020-04-01 — End: 2020-08-16

## 2020-04-01 NOTE — Telephone Encounter (Signed)
Last appointment: 03/01/2020  Next appointment: 04/10/2020

## 2020-04-01 NOTE — Telephone Encounter (Signed)
Please advise if you would like labs drawn for upcoming appt 04/10/20. Current lab orders are from another provider.

## 2020-04-01 NOTE — Telephone Encounter (Signed)
Outside  mammo for review

## 2020-04-01 NOTE — Progress Notes (Signed)
SUBJECTIVE:    Chief Complaint   Patient presents with    Right Foot - Follow-up, Diabetes    Left Foot - Follow-up, Diabetes     Right 5th and left 4th underneath toes are cut.   A1C: 6.6 on 12/25/19     64 year old patient presents to clinic for diabetic foot check.  She's been diabetic for about 5 years.  Her last A1c was 6.6.  She denies any open cuts or sores.  She denies any redness, swelling, drainage, positive she denies any fevers, chills, nausea, vomiting.  She has no new complaints today.     PMH, PSH, Family, Social, Meds, see above  Allergy:   Allergies   Allergen Reactions    Seafood Itching    Lisinopril Other (See Comments)     Dizziness and cough         OBJECTIVE FINDINGS:     Patient is AO3, NAD  DP/PT 2/4 b/l, CFT <3 sec b/l  Sensation intact with SW 5.07g monofilament b/l  Motor fxn intact b/l  Muscle strength 5/5 b/l  Skin is warm, dry, supple.  Partial-thickness fissuring noted subfourth and fifth digits bilateral feet.  No erythema, edema or signs of infection appreciated.     No components found for: A1C     DIAGNOSIS:    Encounter Diagnoses   Name Primary?    Type 2 diabetes mellitus without complication, without long-term current use of insulin Yes    Encounter for diabetic foot exam     Fissure in skin of both feet                             PLAN:     - no clinically significant foot or ankle abnormalities  - optimize blood glucose control and blood pressure control and coordinated care and f/u visits with primary care provider  - detailed explanation of potential foot and ankle problems associated with diabetes  - Patient has been afforded the opportunity to ask questions. All questions answered. Patient verbalized understanding of all issues and instructions  - Debrided patient's nails without incident  -Prescription for AmLactin cream provided to the patient  -Follow-up yearly for diabetic foot check     Thanks for the referral Dr. Rennis Harding

## 2020-04-01 NOTE — Telephone Encounter (Signed)
Yes. See lab orders

## 2020-04-03 ENCOUNTER — Ambulatory Visit: Payer: Medicare (Managed Care) | Admitting: Primary Care

## 2020-04-04 ENCOUNTER — Encounter: Payer: Self-pay | Admitting: Primary Care

## 2020-04-04 ENCOUNTER — Other Ambulatory Visit
Admission: RE | Admit: 2020-04-04 | Discharge: 2020-04-04 | Disposition: A | Discharge status: Home / Self Care | Payer: Medicare (Managed Care) | Source: Ambulatory Visit | Attending: Primary Care | Admitting: Primary Care

## 2020-04-04 DIAGNOSIS — E119 Type 2 diabetes mellitus without complications: Secondary | ICD-10-CM | POA: Insufficient documentation

## 2020-04-04 DIAGNOSIS — I10 Essential (primary) hypertension: Secondary | ICD-10-CM

## 2020-04-04 LAB — COMPREHENSIVE METABOLIC PANEL
ALT: 19 U/L (ref 0–35)
AST: 27 U/L (ref 0–35)
Albumin: 4.3 g/dL (ref 3.5–5.2)
Alk Phos: 57 U/L (ref 35–105)
Anion Gap: 14 (ref 7–16)
Bilirubin,Total: 0.6 mg/dL (ref 0.0–1.2)
CO2: 24 mmol/L (ref 20–28)
Calcium: 9.8 mg/dL (ref 8.6–10.2)
Chloride: 102 mmol/L (ref 96–108)
Creatinine: 0.82 mg/dL (ref 0.51–0.95)
GFR,Black: 87 *
GFR,Caucasian: 76 *
Glucose: 101 mg/dL — ABNORMAL HIGH (ref 60–99)
Lab: 14 mg/dL (ref 6–20)
Potassium: 4.4 mmol/L (ref 3.3–5.1)
Sodium: 140 mmol/L (ref 133–145)
Total Protein: 7.1 g/dL (ref 6.3–7.7)

## 2020-04-04 LAB — CBC
Hematocrit: 39 % (ref 34–45)
Hemoglobin: 12.7 g/dL (ref 11.2–15.7)
MCH: 29 pg (ref 26–32)
MCHC: 33 g/dL (ref 32–36)
MCV: 90 fL (ref 79–95)
Platelets: 299 10*3/uL (ref 160–370)
RBC: 4.3 MIL/uL (ref 3.9–5.2)
RDW: 14.8 % — ABNORMAL HIGH (ref 11.7–14.4)
WBC: 5.9 10*3/uL (ref 4.0–10.0)

## 2020-04-04 LAB — LIPID PANEL
Chol/HDL Ratio: 2.1
Cholesterol: 199 mg/dL
HDL: 94 mg/dL — ABNORMAL HIGH (ref 40–60)
LDL Calculated: 93 mg/dL
Non HDL Cholesterol: 105 mg/dL
Triglycerides: 60 mg/dL

## 2020-04-04 LAB — URINALYSIS REFLEX TO CULTURE
Blood,UA: NEGATIVE
Glucose,UA: NEGATIVE mg/dL
Ketones, UA: NEGATIVE
Leuk Esterase,UA: NEGATIVE
Nitrite,UA: NEGATIVE
Protein,UA: NEGATIVE mg/dL
Specific Gravity,UA: 1.015 (ref 1.002–1.030)
pH,UA: 6.5 (ref 5.0–8.0)

## 2020-04-04 LAB — MICROALBUMIN, URINE, RANDOM
Creatinine,UR: 110 mg/dL (ref 20–300)
Microalbumin,UR: <1.2 mg/dL

## 2020-04-04 NOTE — Telephone Encounter (Signed)
Spoke to pt and she understands

## 2020-04-04 NOTE — Telephone Encounter (Signed)
Spoke to pt and she has been on a new BP medication and she is having some feet and leg swollen lately No SOB or chest pain but asking to have some blood work done I see their is orders in her EMR is this okay to get these done now or anything to add per pt message

## 2020-04-04 NOTE — Telephone Encounter (Signed)
Those tests are already ordered and can be done now

## 2020-04-05 LAB — HEMOGLOBIN A1C: Hemoglobin A1C: 6.7 % — ABNORMAL HIGH

## 2020-04-09 NOTE — Progress Notes (Signed)
[place]     Leira Regino is a 64 y.o. female who presents for Follow-up, Hypertension, Lab Results, and Diabetes    Patient seen for follow-up of hypertension.  At her last visit she was started on nifedipine 30 mg once a day.  She was also asked to continue on hydrochlorothiazide.  However patient misunderstood and has stopped taking the hydrochlorothiazide.  She has been taking the nifedipine but wonders if it is causing side effects like muscle aches and joint pain and she feels like those symptoms have been increased.  Patient has also been not taking the Metformin as prescribed.  A1c is slightly worse.  Patient was not really sure if the Metformin or the nifedipine was causing the problems and indicates that that is why she stopped taking the Metformin.  Also had blood work done and would like that reviewed.  Neck pain continues.  She is seeing a therapist and plans to get back to therapy as that was very helpful with the neck pain.  Interim history is significant for visit to plastic surgery for evaluation of breast reduction surgery.  She also saw the foot doctor.  Due to return in 1 year.  Also evaluated by orthopedic surgery for s/p total knee replacement bilaterally.      Review of Systems   Constitutional: Negative for chills, fever and weight loss.   HENT: Negative for sore throat.    Respiratory: Negative for cough, shortness of breath and wheezing.    Cardiovascular: Negative for chest pain and palpitations.   Musculoskeletal: Positive for back pain, joint pain and neck pain.   Skin: Negative for rash.       MEDICATIONS     Current Outpatient Medications   Medication Sig    hydroCHLOROthiazide (HYDRODIURIL) 12.5 MG tablet SMARTSIG:Tablet(s) By Mouth    NIFEdipine (PROCARDIA XL) 30 MG 24 hr tablet Take 1 tablet (30 mg total) by mouth daily Swallow whole. Do not crush, break, or chew.    Ascorbic Acid (VITAMIN C) 500 MG CAPS Take 1,000 mg by mouth      CINNAMON PO Take by mouth    COD LIVER OIL PO Take by mouth    Zinc Sulfate (ZINC-220 PO) Take by mouth    Magnesium Oxide 500 MG TABS Take 1 tablet by mouth daily    Potassium 99 MG TABS Take by mouth     blood glucose test strip By no specified route 4 times daily Test 3-4 times a day. Freestyle Freedom. Dx: DM-2    TURMERIC PO Take 1 tablet by mouth daily    amoxicillin (AMOXIL) 500 MG capsule Prior to dental procedures    blood glucose monitor (FREESTYLE FREEDOM LITE) kit Use daily for BG testing    Non-System Medication Abbott freestyle lifestyle touch dx: DM-2    Non-System Medication BP cuff, large Dx: HTN    cholecalciferol (VITAMIN D) 1000 UNIT tablet Take 2,000 Units by mouth daily       ammonium lactate (AMLACTIN) 12 % cream Apply topically 2 times daily as needed for Dry Skin to the following areas: apply to feet daily    ALPRAZolam (XANAX) 0.25 MG tablet Take 1 tablet (0.25 mg total) by mouth 2 times daily as needed for Anxiety Max daily dose: 0.5 mg    metFORMIN (GLUCOPHAGE) 500 MG tablet Take 1 tablet (500 mg total) by mouth 2 times daily (with meals) (Patient not taking: Reported on 04/10/2020)  nystatin (MYCOSTATIN) ointment Apply topically 2 times daily as needed 30GM Use topically twice daily on affected area[s]    Melatonin 3 MG CAPS Take 3 mg by mouth nightly as needed        Patient's medications were reviewed and updated at today's visit and no changes were made    ALLERGIES     Seafood and Lisinopril      Patient's allergies were reviewed and updated at today's visit and no changes were made    PROBLEM LIST     Patient Active Problem List   Diagnosis Code    Sudden hearing loss H91.20    Obesity (BMI 30-39.9) E66.9    Type 2 diabetes mellitus E11.9    Carpal tunnel syndrome G56.00    HTN (hypertension) I10    Osteoarthritis, knee M17.10    Presence of right artificial knee joint Z96.651    Hyperlipidemia, mixed E78.2    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   Constitutional:       General: She is not in acute distress.     Comments: Pleasant lady who appears to be in no distress, wearing a mask   HENT:      Head: Normocephalic and atraumatic.      Right Ear: External ear normal.      Left Ear: External ear normal.      Nose: Nose normal.   Eyes:      Conjunctiva/sclera: Conjunctivae normal.   Cardiovascular:      Rate and Rhythm: Normal rate and regular rhythm.      Heart sounds: Normal heart sounds. No murmur heard.     Pulmonary:      Effort: Pulmonary effort is normal. No respiratory distress.      Breath sounds: Normal breath sounds. No stridor. No wheezing or rales.   Neurological:      Mental Status: She is alert.   Psychiatric:         Mood and Affect: Mood normal.           RECENT LABS     Recent Results (from the past 336 hour(s))   CBC    Collection Time: 04/04/20  3:47 PM   Result Value Ref Range    WBC 5.9 4.0 - 10.0 THOU/uL    RBC 4.3 3.90 - 5.20 MIL/uL    Hemoglobin 12.7 11.2 - 15.7 g/dL    Hematocrit 39 34.00 - 45.00 %    MCV 90 79.0 - 95.0 fL    MCH 29 26 - 32 pg    MCHC 33 32 - 36 g/dL    RDW 14.8 (H) 11.7 - 14.4 %    Platelets 299 160 - 370 THOU/uL   Hemoglobin A1c    Collection Time: 04/04/20  3:47 PM   Result Value  Ref Range    Hemoglobin A1C 6.7 (H) %   Microalbumin, Urine, Random    Collection Time: 04/04/20  3:47 PM   Result Value Ref Range    Creatinine,UR 110 20 - 300 mg/dL    Microalbumin,UR <1.20 mg/dL    Microalb/Creat Ratio see below 0.0 - 29.9 mg MA/g CR   Lipid Panel (Reflex to Direct  LDL if Triglycerides more than 400)     Collection Time: 04/04/20  3:47 PM   Result Value Ref Range    Cholesterol 199 mg/dL    Triglycerides 60 mg/dL    HDL 94 (H) 40 - 60 mg/dL    LDL Calculated 93 mg/dL    Non HDL Cholesterol 105 mg/dL    Chol/HDL Ratio 2.1    Comprehensive metabolic panel    Collection Time: 04/04/20  3:47 PM   Result Value Ref Range    Sodium 140 133 - 145 mmol/L    Potassium 4.4 3.3 - 5.1 mmol/L    Chloride 102 96 - 108 mmol/L    CO2 24 20 - 28 mmol/L    Anion Gap 14 7 - 16    UN 14 6 - 20 mg/dL    Creatinine 0.82 0.51 - 0.95 mg/dL    GFR,Caucasian 76 *    GFR,Black 87 *    Glucose 101 (H) 60 - 99 mg/dL    Calcium 9.8 8.6 - 10.2 mg/dL    Total Protein 7.1 6.3 - 7.7 g/dL    Albumin 4.3 3.5 - 5.2 g/dL    Bilirubin,Total 0.6 0.0 - 1.2 mg/dL    AST 27 0 - 35 U/L    ALT 19 0 - 35 U/L    Alk Phos 57 35 - 105 U/L   Urinalysis reflex to culture    Collection Time: 04/04/20  3:47 PM   Result Value Ref Range    Color, UA Yellow Yellow-Dark Yellow    Appearance,UR Clear Clear    Specific Gravity,UA 1.015 1.002 - 1.030    Leuk Esterase,UA NEG NEGATIVE    Nitrite,UA NEG NEGATIVE    pH,UA 6.5 5.0 - 8.0    Protein,UA NEG NEGATIVE mg/dL    Glucose,UA NEG NEGATIVE mg/dL    Ketones, UA NEG NEGATIVE    Blood,UA NEG NEGATIVE            ASSESSMENT/PLAN     1. Essential hypertension  I urged her to take the medication as prescribed.  Patient states that she will start taking the hydrochlorothiazide.  She will continue on the current dose of nifedipine and see if her symptoms resolve.  We also discussed that if she is unable to continue to tolerate the medication we would consider losartan 50 mg once a day.  She has never tried losartan but had cough and dizziness with the lisinopril.  Patient will continue to follow low-salt diet and exercise 30 minutes most days of the week.  Continue to work on weight loss.  Goal blood pressure 130/80 or less.  Labs reviewed with patient.    2. Type 2 diabetes mellitus  Labs reviewed with patient.  A1c is not well  controlled as patient had stopped taking Metformin.  Recommend that she restart.  Follow low-carb diet and exercise 30 minutes most days of the week.  Continue to work on weight loss.  Recheck labs in 3 months  - Hemoglobin A1c; Future  - Comprehensive metabolic panel; Future  - Lipid Panel (Reflex to Direct  LDL  if Triglycerides more than 400); Future  - Microalbumin, Urine, Random; Future    3. Hyperlipidemia, mixed  The 10-year ASCVD risk score Mikey Bussing DC Jr., et al., 2013) is: 17.9%    Values used to calculate the score:      Age: 50 years      Sex: Female      Is Non-Hispanic African American: Yes      Diabetic: Yes      Tobacco smoker: No      Systolic Blood Pressure: 056 mmHg      Is BP treated: Yes      HDL Cholesterol: 94 mg/dL      Total Cholesterol: 199 mg/dL     Patient is at very high risk for heart attack and stroke.  I once again reviewed risk for heart attack and stroke without statins.  I stressed the need for weight loss and following a whole food plant-based diet.  Patient understands risk for MI and stroke but declined starting on statins.  Wants to be more aggressive with her diet.  Recheck labs in 3 months     4. Obesity (BMI 30-39.9)  Continue to work on weight loss.  --Patient instructed to call if symptoms are not improving or worsening  --Follow-up arranged    Signed: Earna Coder, MD on 04/10/2020 at 2:09 PM

## 2020-04-10 ENCOUNTER — Encounter: Payer: Self-pay | Admitting: Primary Care

## 2020-04-10 ENCOUNTER — Ambulatory Visit: Payer: Medicare (Managed Care) | Admitting: Primary Care

## 2020-04-10 VITALS — BP 132/76 | HR 79 | Ht 64.5 in | Wt 202.0 lb

## 2020-04-10 DIAGNOSIS — E119 Type 2 diabetes mellitus without complications: Secondary | ICD-10-CM

## 2020-04-10 DIAGNOSIS — E669 Obesity, unspecified: Secondary | ICD-10-CM

## 2020-04-10 DIAGNOSIS — I10 Essential (primary) hypertension: Secondary | ICD-10-CM

## 2020-04-10 DIAGNOSIS — E782 Mixed hyperlipidemia: Secondary | ICD-10-CM

## 2020-04-10 MED ORDER — HYDROCHLOROTHIAZIDE 12.5 MG PO TABS *I*
25.0000 mg | ORAL_TABLET | Freq: Every day | ORAL | 1 refills | Status: DC
Start: 2020-04-10 — End: 2020-07-31

## 2020-04-23 ENCOUNTER — Other Ambulatory Visit
Admission: RE | Admit: 2020-04-23 | Discharge: 2020-04-23 | Disposition: A | Discharge status: Home / Self Care | Payer: Medicare (Managed Care) | Source: Ambulatory Visit | Attending: Obstetrics | Admitting: Obstetrics

## 2020-04-23 DIAGNOSIS — Z01419 Encounter for gynecological examination (general) (routine) without abnormal findings: Secondary | ICD-10-CM | POA: Insufficient documentation

## 2020-04-23 DIAGNOSIS — Z124 Encounter for screening for malignant neoplasm of cervix: Secondary | ICD-10-CM | POA: Insufficient documentation

## 2020-04-30 LAB — GYN CYTOLOGY

## 2020-05-02 ENCOUNTER — Ambulatory Visit: Payer: Medicare (Managed Care) | Admitting: Foot & Ankle Surgery

## 2020-05-09 ENCOUNTER — Encounter: Payer: Self-pay | Admitting: Gastroenterology

## 2020-07-02 ENCOUNTER — Other Ambulatory Visit: Payer: Self-pay | Admitting: Gastroenterology

## 2020-07-09 ENCOUNTER — Telehealth: Payer: Self-pay | Admitting: Primary Care

## 2020-07-09 NOTE — Telephone Encounter (Signed)
Pt stopped taking Nifedipine, it gave her body aches and swelling. She is taking Hydrochlorothiazide 12.5 mg, two tablets once daily and she started herself back on Verapamil 240 mg one tablet twice daily. She wants to make sure that her BP is managed and is also having surgery in December. Pt asking for an OV 11/11 in the morning or anytime 11/12. Pt aware provider is out of the office, please advise.

## 2020-07-09 NOTE — Telephone Encounter (Signed)
Can offer 11:40 am appt on 07/12/20

## 2020-07-09 NOTE — Telephone Encounter (Signed)
Patient is calling because she would like to talk to Dr. Rennis Harding about her new BP medication and how she needed to go back to her other medication because she was swelling and making her feel sick please advise

## 2020-07-10 NOTE — Telephone Encounter (Signed)
Appointment offered accepted and added to schedule

## 2020-07-11 ENCOUNTER — Encounter: Payer: Self-pay | Admitting: Gastroenterology

## 2020-07-12 ENCOUNTER — Ambulatory Visit: Payer: Medicare (Managed Care) | Admitting: Primary Care

## 2020-07-12 ENCOUNTER — Encounter: Payer: Self-pay | Admitting: Primary Care

## 2020-07-12 VITALS — BP 144/74 | HR 71 | Ht 64.5 in | Wt 198.0 lb

## 2020-07-12 DIAGNOSIS — I10 Essential (primary) hypertension: Secondary | ICD-10-CM

## 2020-07-12 MED ORDER — VERAPAMIL HCL CR 240 MG PO TBCR *I*
240.0000 mg | ORAL_TABLET | Freq: Two times a day (BID) | ORAL | 5 refills | Status: DC
Start: 2020-07-12 — End: 2021-04-11

## 2020-07-12 NOTE — Progress Notes (Signed)
[place]     Robin Green is a 64 y.o. female who presents for Follow-up and Medication Management    Patient seen today for follow-up of hypertension.  At her last visit she was started on nifedipine.  Patient states that caused leg swelling and she stopped taking it.  She started herself back on hydrochlorothiazide and verapamil that she was on previously.  Notes that blood pressures at home have been a little bit better.  Denies any headache or vision problems.  Has been doing this for about 3 weeks.      Review of Systems   Constitutional: Negative for chills and fever.   HENT: Negative for sore throat.    Respiratory: Negative for cough, shortness of breath and wheezing.    Cardiovascular: Negative for chest pain and palpitations.   Skin: Negative for rash.       MEDICATIONS     Current Outpatient Medications   Medication Sig    hydroCHLOROthiazide (HYDRODIURIL) 12.5 MG tablet Take 2 tablets (25 mg total) by mouth daily    Ascorbic Acid (VITAMIN C) 500 MG CAPS Take 1,000 mg by mouth     CINNAMON PO Take by mouth    Zinc Sulfate (ZINC-220 PO) Take by mouth    Potassium 99 MG TABS Take by mouth     blood glucose test strip By no specified route 4 times daily Test 3-4 times a day. Freestyle Freedom. Dx: DM-2    metFORMIN (GLUCOPHAGE) 500 MG tablet Take 1 tablet (500 mg total) by mouth 2 times daily (with meals)    TURMERIC PO Take 1 tablet by mouth daily    blood glucose monitor (FREESTYLE FREEDOM LITE) kit Use daily for BG testing    Non-System Medication Abbott freestyle lifestyle touch dx: DM-2    Non-System Medication BP cuff, large Dx: HTN    cholecalciferol (VITAMIN D) 1000 UNIT tablet Take 2,000 Units by mouth daily       verapamil (CALAN-SR) 240 mg CR tablet Take 1 tablet (240 mg total) by mouth 2 times daily  Swallow whole. Do not crush, break, or chew.    ammonium lactate (AMLACTIN) 12 % cream Apply topically 2 times daily as needed for Dry Skin  to the following areas: apply to feet daily    ALPRAZolam (XANAX) 0.25 MG tablet Take 1 tablet (0.25 mg total) by mouth 2 times daily as needed for Anxiety Max daily dose: 0.5 mg    nystatin (MYCOSTATIN) ointment Apply topically 2 times daily as needed 30GM Use topically twice daily on affected area[s]    Melatonin 3 MG CAPS Take 3 mg by mouth nightly as needed     amoxicillin (AMOXIL) 500 MG capsule Prior to dental procedures       Patient's medications were reviewed and updated at today's visit and no changes were made    ALLERGIES     Nifedipine, Seafood, and Lisinopril      Patient's allergies were reviewed and updated at today's visit and no changes were made    PROBLEM LIST     Patient Active Problem List   Diagnosis Code    Sudden hearing loss H91.20    Obesity (BMI 30-39.9) E66.9    Type 2 diabetes mellitus E11.9    Carpal tunnel syndrome G56.00    HTN (hypertension) I10    Osteoarthritis, knee M17.10    Presence of right artificial knee joint Z96.651  Hyperlipidemia, mixed E78.2    Chronic left-sided low back pain with left-sided sciatica M54.42, G89.29    Lumbar spondylosis M47.816    Acute pain of left shoulder M25.512         TOBACCO USE HISTORY     Social History     Tobacco Use   Smoking Status Never Smoker   Smokeless Tobacco Never Used       PHYSICAL EXAM     BP 144/74    Pulse 71    Ht 1.638 m (5' 4.5")    Wt 89.8 kg (198 lb)    SpO2 98%    BMI 33.46 kg/m     General Appearance: No distress    Physical Exam  Vitals reviewed.   Constitutional:       Comments: Pleasant lady in no distress, wearing a mask   HENT:      Head: Normocephalic and atraumatic.      Right Ear: External ear normal.      Left Ear: External ear normal.      Nose: Nose normal.   Eyes:      General:         Right eye: No discharge.         Left eye: No discharge.      Conjunctiva/sclera: Conjunctivae normal.   Cardiovascular:      Rate and Rhythm: Normal rate and regular rhythm.      Heart sounds: Normal heart  sounds. No murmur heard.      Pulmonary:      Effort: Pulmonary effort is normal. No respiratory distress.      Breath sounds: Normal breath sounds. No wheezing.   Neurological:      Mental Status: She is alert.   Psychiatric:         Mood and Affect: Mood normal.           RECENT LABS   No results found for this or any previous visit (from the past 336 hour(s)).         ASSESSMENT/PLAN     1. Essential hypertension  Follow low-salt diet and exercise 30 minutes most days of the week.  Continue 50 mg of hydrochlorothiazide and 240 mg of verapamil twice a day.  Continue to work on weight loss.  Goal blood pressure 130/80 or less.  Patient is to call me with an update in 1 week.  If blood pressures continue to be elevated plan is to increase verapamil to 240 mg 3 times a day.  Patient verbalized understanding of plan    Return in about 4 weeks (around 08/09/2020) for Follow-up, HTN.    --Patient instructed to call if symptoms are not improving or worsening  --Follow-up arranged    Signed: Earna Coder, MD on 07/13/2020 at 2:28 PM

## 2020-07-22 NOTE — Telephone (Signed)
Pre-Operative Instructions                     FOLLOW YOUR SURGEON'S INSTRUCTIONS IF DIFFERENT THAN BELOW.                    PRIOR TO SURGERY  · Five days before surgery, if surgeon does not specify, please STOP taking:  1. Anti-inflammatory meds: (Ibuprofen, Motrin, Advil, Mobic, Meloxicam, Aleve, Naproxen, Voltaren, etc.  2. Vitamins and herbal supplements, including herbal teas     · YOU MAY TAKE ACETAMINOPHEN (TYLENOL) as needed    · Directions regarding your prescribed blood thinners, including aspirin, must be approved by your cardiologist or prescribing doctor.    ARRIVAL/SURGICAL TIME  · Staff from the [facility] will call you between 130PM and 4PM on the day before surgery to inform you of your arrival and surgery times. This call will be Friday afternoon if your surgery is on a Monday.  · Please arrange for transportation to and from the hospital. You must have a responsible adult to stay with you after your surgery if you are going home the same day.  · Please know that you should not drive for 24 hours after receiving anesthesia.    DAY BEFORE SURGERY  · Keep yourself well hydrated to aid in the placement of your IV and for your general well-being.  · Do not eat anything after midnight the night before your surgery (including candy or gum).                                                                                         DAY OF SURGERY  · DO NOT CONSUME FOOD OF ANY KIND.    · Only Gatorade, clear apple juice or water from midnight until 2 hours before your scheduled surgery.    · Please bring photo identification and your insurance card with you for registration.    · If you are female, please be prepared to provide a urine sample on arrival to the preoperative area unless you are one year post menopause or have had a hysterectomy.     ·  Please wear loose, comfortable clothing and comfortable walking shoes.  Wear something that will easily accommodate a bandage, cast or other  type of dressing at your surgical site.    · DO NOT WEAR: HEAVY MAKEUP, DARK NAIL POLISH, HAIR PINS, BODY LOTION OR SCENTS.      · Please understand that rings and body piercings need to be removed and left at home.  If they are not removed, your surgery is at risk of being delayed or cancelled.    · When you come to [place], please try not to bring any valuable items with the exception of your phone.      · Valuables such as jewelry, cash and credit cards are best left at home during your stay.  Please send them home with family members.  If you are alone a staff member will assist you in storing your belongings.    · We  ask that your cell phone be with you and   turned on, so that the Preop and Operating Room nurses may phone you directly with instructions on the morning of surgery if necessary, and you can be in communication with your family.     [facility] does not assume responsibility for items brought with you on the day of surgery.     If wearing eyeglasses, please bring a case.  DO NOT WEAR CONTACT LENSES.    You may shower, brush your teeth, and use deodorant.     Patient visitation is restricted at this time due to COVID-19 concerns.     Surgical Patients: For patients having surgery, one individual will be allowed to accompany the patient during the pre-surgical process and then must leave the premises once the patient goes into surgery. This individual may return at discharge. If the patient is admitted to the hospital after surgery, then the inpatient visitation policy will be followed.          MEDICATIONS: DAY OF SURGERY YOU MAY TAKE VERAPAMIL AND XANAX (ALPRAZOLAM) THE DAY OF SURGERY    When you arrive on the day of surgery, you will need to provide your prescribed medications and the last time you took them. Please be prepared. You may bring a list with you.    DO NOT TAKE METFORMIN OR HYDROCHLOROTHIAZIDE THE DAY OF SURGERY     Take your medications as directed according to your  printed, verbal or MyChart instructions.   Anxiety and pain medications may be taken as prescribed at any time prior to arrival.    Medications from the [facility] will be administered to you under the direction of your surgeon. Please leave your prescriptions at home with the exception of inhalers.    DIABETICS: ON THE DAY OF SURGERY   Do not take oral diabetes medications. METFORMIN   If you are diabetic and feeling symptomatic, you may take sugar containing clear liquids after midnight the night before your surgery if your glucose level is less than 70.  If you are still feeling symptomatic, you may report to the Surgery Center early and report your symptoms to anesthesia personnel.    AT THE HOSPITAL ON THE DAY OF SURGERY   Park in the Main Ramp garage.  Enter the building through the [employer].      Please stop at the Information Desk for directions to the Surgery Center on [address].       [facility]  The pharmacy is open from 9am to 5:30pm on weekdays and 10am-2pm on Saturdays.     Any prescriptions you may need after your surgery can be filled at the Outpatient Pharmacy in the main lobby:     Pharmacy staff will coordinate obtaining your insurance information and co-payments, which will be identical to those of your home pharmacy.   A credit card can be called in to the pharmacy in advance and stored securely with  encryption, to be used only for medications prescribed for you.   Please have your support person call the pharmacy with a credit card number during your surgery to expedite your discharge from the hospital.   Cash or a check are acceptable forms of payment as well, and payment can be facilitated at the time of discharge by staff members accompanying you to your car.   Prescriptions cannot be filled without payment. Thank you for your consideration.    QUESTIONS?   Question about these instructions? Call [phone], select option 2, and  leave  any time.   A nurse will return your call during our regular business hours.  · Any questions regarding specifics about your surgery or recovery? Please call your surgeon’s office.

## 2020-07-26 ENCOUNTER — Other Ambulatory Visit: Payer: Self-pay | Admitting: Primary Care

## 2020-07-27 ENCOUNTER — Ambulatory Visit: Payer: Medicare (Managed Care) | Attending: Surgery

## 2020-07-27 DIAGNOSIS — Z01812 Encounter for preprocedural laboratory examination: Secondary | ICD-10-CM | POA: Insufficient documentation

## 2020-07-27 DIAGNOSIS — Z20828 Contact with and (suspected) exposure to other viral communicable diseases: Secondary | ICD-10-CM | POA: Insufficient documentation

## 2020-07-27 DIAGNOSIS — Z20822 Contact with and (suspected) exposure to covid-19: Secondary | ICD-10-CM | POA: Insufficient documentation

## 2020-07-27 DIAGNOSIS — N62 Hypertrophy of breast: Secondary | ICD-10-CM | POA: Insufficient documentation

## 2020-07-28 LAB — COVID-19 NAAT (PCR): COVID-19 NAAT (PCR): NEGATIVE

## 2020-07-28 LAB — COVID-19 PCR

## 2020-07-28 MED ORDER — ALPRAZOLAM 0.25 MG PO TABS *I*
0.2500 mg | ORAL_TABLET | Freq: Two times a day (BID) | ORAL | 0 refills | Status: DC | PRN
Start: 2020-07-28 — End: 2021-04-04

## 2020-07-31 ENCOUNTER — Ambulatory Visit
Admission: RE | Admit: 2020-07-31 | Discharge: 2020-07-31 | Disposition: A | Discharge status: Home / Self Care | Payer: Medicare (Managed Care) | Source: Ambulatory Visit | Attending: Obstetrics | Admitting: Obstetrics

## 2020-07-31 ENCOUNTER — Ambulatory Visit: Payer: Medicare (Managed Care) | Admitting: Anesthesiology

## 2020-07-31 ENCOUNTER — Ambulatory Visit: Payer: Medicare (Managed Care)

## 2020-07-31 ENCOUNTER — Encounter
Admission: RE | Disposition: A | Discharge status: Home / Self Care | Payer: Self-pay | Source: Ambulatory Visit | Attending: Obstetrics

## 2020-07-31 DIAGNOSIS — Z9889 Other specified postprocedural states: Secondary | ICD-10-CM

## 2020-07-31 DIAGNOSIS — N840 Polyp of corpus uteri: Secondary | ICD-10-CM

## 2020-07-31 DIAGNOSIS — E785 Hyperlipidemia, unspecified: Secondary | ICD-10-CM | POA: Insufficient documentation

## 2020-07-31 DIAGNOSIS — Z96652 Presence of left artificial knee joint: Secondary | ICD-10-CM | POA: Insufficient documentation

## 2020-07-31 DIAGNOSIS — R9389 Abnormal findings on diagnostic imaging of other specified body structures: Secondary | ICD-10-CM

## 2020-07-31 DIAGNOSIS — E119 Type 2 diabetes mellitus without complications: Secondary | ICD-10-CM | POA: Insufficient documentation

## 2020-07-31 DIAGNOSIS — Z7984 Long term (current) use of oral hypoglycemic drugs: Secondary | ICD-10-CM | POA: Insufficient documentation

## 2020-07-31 DIAGNOSIS — I10 Essential (primary) hypertension: Secondary | ICD-10-CM | POA: Insufficient documentation

## 2020-07-31 DIAGNOSIS — N8110 Cystocele, unspecified: Secondary | ICD-10-CM | POA: Insufficient documentation

## 2020-07-31 HISTORY — PX: PR HYSTEROSCOPY BX ENDOMETRIUM&/POLYPC W/WO D&C: 58558

## 2020-07-31 LAB — BASIC METABOLIC PANEL
Anion Gap: 14 (ref 7–16)
CO2: 25 mmol/L (ref 20–28)
Calcium: 9.8 mg/dL (ref 8.6–10.2)
Chloride: 101 mmol/L (ref 96–108)
Creatinine: 0.87 mg/dL (ref 0.51–0.95)
GFR,Black: 81 *
GFR,Caucasian: 70 *
Glucose: 108 mg/dL — ABNORMAL HIGH (ref 60–99)
Lab: 14 mg/dL (ref 6–20)
Potassium: 4.2 mmol/L (ref 3.3–5.1)
Sodium: 140 mmol/L (ref 133–145)

## 2020-07-31 LAB — POCT GLUCOSE
Glucose POCT: 103 mg/dL — ABNORMAL HIGH (ref 60–99)
Glucose POCT: 91 mg/dL (ref 60–99)
Glucose POCT: 90 mg/dL (ref 60–99)

## 2020-07-31 SURGERY — HYSTEROSCOPY, WITH OR WITHOUT DILATION AND CURETTAGE OF UTERUS, WITH OR WITHOUT POLYPECTOMY
Anesthesia: General | Site: Uterus | Wound class: Clean Contaminated

## 2020-07-31 MED ORDER — LIDOCAINE HCL 2 % IJ SOLN *I*
INTRAMUSCULAR | Status: DC | PRN
Start: 2020-07-31 — End: 2020-07-31
  Administered 2020-07-31: 100 mg via INTRAVENOUS

## 2020-07-31 MED ORDER — HYDROMORPHONE HCL PF 1 MG/ML IJ SOLN *WRAPPED*
0.5000 mg | INTRAMUSCULAR | Status: DC | PRN
Start: 2020-07-31 — End: 2020-07-31

## 2020-07-31 MED ORDER — ONDANSETRON HCL 2 MG/ML IV SOLN *I*
4.0000 mg | Freq: Three times a day (TID) | INTRAMUSCULAR | Status: DC | PRN
Start: 2020-07-31 — End: 2020-07-31

## 2020-07-31 MED ORDER — ACETAMINOPHEN 325 MG PO TABS *I*
650.0000 mg | ORAL_TABLET | Freq: Four times a day (QID) | ORAL | Status: DC
Start: 2020-07-31 — End: 2020-07-31

## 2020-07-31 MED ORDER — ATROPINE SULFATE 1 MG/ML IJ/IV SOLN *WRAPPED*
INTRAMUSCULAR | Status: AC
Start: 2020-07-31 — End: 2020-07-31
  Filled 2020-07-31: qty 1

## 2020-07-31 MED ORDER — SODIUM CHLORIDE 0.9 % 25 ML IV SOLN *I*
12.5000 mg | Freq: Four times a day (QID) | INTRAVENOUS | Status: DC | PRN
Start: 2020-07-31 — End: 2020-07-31

## 2020-07-31 MED ORDER — FENTANYL CITRATE 50 MCG/ML IJ SOLN *WRAPPED*
INTRAMUSCULAR | Status: AC
Start: 2020-07-31 — End: 2020-07-31
  Filled 2020-07-31: qty 2

## 2020-07-31 MED ORDER — ONDANSETRON HCL 2 MG/ML IV SOLN *I*
4.0000 mg | Freq: Once | INTRAMUSCULAR | Status: DC | PRN
Start: 2020-07-31 — End: 2020-07-31

## 2020-07-31 MED ORDER — PROPOFOL 10 MG/ML IV EMUL (INTERMITTENT DOSING) WRAPPED *I*
INTRAVENOUS | Status: DC | PRN
Start: 2020-07-31 — End: 2020-07-31
  Administered 2020-07-31: 200 mg via INTRAVENOUS

## 2020-07-31 MED ORDER — LIDOCAINE HCL (PF) 1 % IJ SOLN *I*
INTRAMUSCULAR | Status: AC
Start: 2020-07-31 — End: 2020-07-31
  Filled 2020-07-31: qty 30

## 2020-07-31 MED ORDER — MIDAZOLAM HCL 1 MG/ML IJ SOLN *I* WRAPPED
INTRAMUSCULAR | Status: AC
Start: 2020-07-31 — End: 2020-07-31
  Filled 2020-07-31: qty 2

## 2020-07-31 MED ORDER — LACTATED RINGERS IV SOLN *I*
125.0000 mL/h | INTRAVENOUS | Status: DC
Start: 2020-07-31 — End: 2020-07-31
  Administered 2020-07-31: 125 mL/h via INTRAVENOUS

## 2020-07-31 MED ORDER — SODIUM CHLORIDE 0.9 % 25 ML IV SOLN *I*
6.2500 mg | Freq: Once | INTRAVENOUS | Status: DC | PRN
Start: 2020-07-31 — End: 2020-07-31

## 2020-07-31 MED ORDER — FENTANYL CITRATE 50 MCG/ML IJ SOLN *WRAPPED*
INTRAMUSCULAR | Status: DC | PRN
Start: 2020-07-31 — End: 2020-07-31
  Administered 2020-07-31: 50 ug via INTRAVENOUS

## 2020-07-31 MED ORDER — ONDANSETRON HCL 2 MG/ML IV SOLN *I*
INTRAMUSCULAR | Status: AC
Start: 2020-07-31 — End: 2020-07-31
  Filled 2020-07-31: qty 2

## 2020-07-31 MED ORDER — LIDOCAINE HCL 2 % (PF) IJ SOLN *I*
INTRAMUSCULAR | Status: AC
Start: 2020-07-31 — End: 2020-07-31
  Filled 2020-07-31: qty 5

## 2020-07-31 MED ORDER — MIDAZOLAM HCL 1 MG/ML IJ SOLN *I* WRAPPED
INTRAMUSCULAR | Status: DC | PRN
Start: 2020-07-31 — End: 2020-07-31
  Administered 2020-07-31: 2 mg via INTRAVENOUS

## 2020-07-31 MED ORDER — ONDANSETRON HCL 2 MG/ML IV SOLN *I*
INTRAMUSCULAR | Status: DC | PRN
Start: 2020-07-31 — End: 2020-07-31
  Administered 2020-07-31: 4 mg via INTRAVENOUS

## 2020-07-31 MED ORDER — LIDOCAINE HCL 1 % IJ SOLN *I*
INTRAMUSCULAR | Status: DC | PRN
Start: 2020-07-31 — End: 2020-07-31
  Administered 2020-07-31: 20 mL via SUBCUTANEOUS

## 2020-07-31 MED ORDER — SODIUM CHLORIDE 0.9 % IV SOLN WRAPPED *I*
20.0000 mL/h | Status: DC
Start: 2020-07-31 — End: 2020-07-31

## 2020-07-31 MED ORDER — ATROPINE SULFATE 1 MG/ML IJ/IV SOLN *WRAPPED*
INTRAMUSCULAR | Status: DC | PRN
  Administered 2020-07-31: .5 mg via INTRAVENOUS

## 2020-07-31 MED ORDER — DEXAMETHASONE SODIUM PHOSPHATE 4 MG/ML INJ SOLN *WRAPPED*
INTRAMUSCULAR | Status: DC | PRN
Start: 2020-07-31 — End: 2020-07-31
  Administered 2020-07-31: 8 mg via INTRAVENOUS

## 2020-07-31 MED ORDER — DEXTROSE 5 % FLUSH FOR PUMPS *I*
0.0000 mL/h | INTRAVENOUS | Status: DC | PRN
Start: 2020-07-31 — End: 2020-07-31

## 2020-07-31 MED ORDER — HALOPERIDOL LACTATE 5 MG/ML IJ SOLN *I*
0.5000 mg | Freq: Once | INTRAMUSCULAR | Status: DC | PRN
Start: 2020-07-31 — End: 2020-07-31

## 2020-07-31 MED ORDER — DEXAMETHASONE SODIUM PHOSPHATE 4 MG/ML INJ SOLN *WRAPPED*
INTRAMUSCULAR | Status: AC
Start: 2020-07-31 — End: 2020-07-31
  Filled 2020-07-31: qty 1

## 2020-07-31 MED ORDER — IBUPROFEN 600 MG PO TABS *I*
600.0000 mg | ORAL_TABLET | Freq: Four times a day (QID) | ORAL | Status: DC
Start: 2020-07-31 — End: 2020-07-31

## 2020-07-31 MED ORDER — MEPERIDINE HCL 25 MG/ML IJ SOLN *I*
12.5000 mg | INTRAMUSCULAR | Status: DC | PRN
Start: 2020-07-31 — End: 2020-07-31

## 2020-07-31 MED ORDER — LIDOCAINE HCL (PF) 1 % IJ SOLN *I*
0.1000 mL | INTRAMUSCULAR | Status: DC | PRN
Start: 2020-07-31 — End: 2020-07-31
  Administered 2020-07-31: 0.1 mL via SUBCUTANEOUS
  Filled 2020-07-31: qty 2

## 2020-07-31 MED ORDER — LACTATED RINGERS IV SOLN *I*
20.0000 mL/h | INTRAVENOUS | Status: DC
Start: 2020-07-31 — End: 2020-07-31
  Administered 2020-07-31: 20 mL/h via INTRAVENOUS

## 2020-07-31 MED ORDER — SODIUM CHLORIDE 0.9 % FLUSH FOR PUMPS *I*
0.0000 mL/h | INTRAVENOUS | Status: DC | PRN
Start: 2020-07-31 — End: 2020-07-31

## 2020-07-31 MED ORDER — PROPOFOL 10 MG/ML IV EMUL (INTERMITTENT DOSING) WRAPPED *I*
INTRAVENOUS | Status: AC
Start: 2020-07-31 — End: 2020-07-31
  Filled 2020-07-31: qty 20

## 2020-07-31 MED ORDER — LABETALOL HCL 20 MG/4 ML IV SOLN WRAPPED *I*
5.0000 mg | INTRAVENOUS | Status: DC | PRN
Start: 2020-07-31 — End: 2020-07-31

## 2020-07-31 SURGICAL SUPPLY — 28 items
BAG DECANT 9IN FOR ASEP TRNSF OF FLUIDS FR FLX CONT (Supply) IMPLANT
BAG DECANTER LF (Supply)
BAG DRAIN URINARY CATCH FOR BLK RING (Supply) ×3 IMPLANT
BLANKET UPPER BODY TEMP THERAPY (Supply) IMPLANT
CANISTER DOLPHIN II FLUID 3000ML (Supply) ×6 IMPLANT
CURETTE ENDOMETRIAL SUCT 3.1MM (Supply) IMPLANT
DEVICE MYOSURE LITE TISSUE REMOVAL (Supply) ×2 IMPLANT
FILTER NEPTUNE 4PORT MANIFOLD (Supply) ×3 IMPLANT
GLOVE BIOGEL PI MICRO IND UNDER SZ 6.0 LF (Glove) ×3 IMPLANT
GLOVE BIOGEL PI MICRO IND UNDER SZ 8.0 LF (Glove) ×4 IMPLANT
GLOVE BIOGEL PI MICRO SZ 6 (Glove) ×3 IMPLANT
GLOVE SURG BIOGEL PI ULTRATOUCH SZ 7.5 (Glove) ×4 IMPLANT
GOWN PACK XL (Gown) ×3
LUBRICANT JELLY 2.7GM STER (Supply) IMPLANT
NEEDLE QUINCKE SPI 22G X 3.5IN (Needle) ×3 IMPLANT
PACK CUSTOM HYSTEROSCOPY (Pack) ×3 IMPLANT
PACK GOWN SURGICAL XL (Gown) ×3 IMPLANT
SLEEVE COMP KNEE MED BLENDED (Supply) ×3 IMPLANT
SOL GLYCINE IRRIG 1.5PCT 3000ML BAG (Solution) IMPLANT
SOL H2O IRRIG STER 1000ML BTL (Solution)
SOL SOD CHL IRRIG .9PCT 3000ML BAG (Solution) ×3 IMPLANT
SOL SOD CHL IRRIG 1000ML BTL (Solution) ×1
SOL SOD CHL IV .9PCT 50CC (Drug) ×3 IMPLANT
SOL SODIUM CHLORIDE IRRIG 1000ML BTL (Solution) ×2 IMPLANT
SOL WATER IRRIG STERILE 1000ML BTL (Solution) IMPLANT
TUBING AQUILEX INFLOW (Tubing) ×3 IMPLANT
TUBING AQUILEX OUTFLOW (Tubing) ×3 IMPLANT
TUBING BLUE JUMPER F/ DOLPHIN CANISTER (Tubing) ×6 IMPLANT

## 2020-07-31 NOTE — Discharge Instructions (Signed)
DISCHARGE INSTRUCTIONS  Hysteroscopy, polypectomy, D&C    07/31/2020     You have received sedative medication and/or general anesthesia which may make you drowsy for as long as 24 hours:     A)  DO NOT drive or operate any machinery for 24 hours     B)  DO NOT drink alcoholic beverages for 24 hours     C)  DO NOT make major decisions, sign contracts, etc for 24 hours  Please continue to adhere to these precautions if you are taking narcotic medication.    Discomfort after surgery:  - It is normal to have mild to moderate abdominal cramping for a few days to a few weeks  - Use acetaminophen (Tylenol) or ibuprofen for pain unless you are allergic to these drugs    Surgery site care after surgery  - It is normal to have some vaginal bleeding for up to 2 weeks after surgery.    Activity after surgery  - You should rest the day of the procedure  - You can resume normal activities the day following your procedure  - Shower: You may shower  - Driving: you may drive after 24 hours  - Exercise: Do not resume vigorous exercise for 1 week.  - Intercourse: Nothing in the vagina (no tampons, douching or intercourse) for 4 weeks or until bleeding/discharge has resolved    Diet after surgery  - You may feel nauseated from the surgery or anesthesia.  Advance diet as tolerated.    Medications  See medication reconciliation sheet    When to call your doctor:  - fever greater than 100 degrees F and/or chills  - Nausea and vomiting  - inability to urinate  - severe cramping or abdominal pain not relieved by acetaminophen (Tylenol) or ibuprofen  - foul smelling discharge  - heavy vaginal bleeding (soaking through 1 pad every 1-2 hours)  - With any other concerns or questions  - If you have an emergency and are unable to contact your doctor, you may call the Emergency Department.

## 2020-07-31 NOTE — Anesthesia Procedure Notes (Signed)
---------------------------------------------------------------------------------------------------------------------------------------    AIRWAY     CONDITION PRIOR TO MANIPULATION     Current Airway/Neck Condition:  Normal        For more airway physical exam details, see Anesthesia PreOp Evaluation  AIRWAY METHOD     Patient Position:  Sniffing    Preoxygenated: yes      Induction: IV  Mask Difficulty Assessment:  1 - vent by mask    Number of Attempts at Approach:  1    Number of Other Approaches Attempted:  0  FINAL AIRWAY DETAILS    Final Airway Type:  LMA    Adjunct Airway: oropharyngeal airway (OPA)    Final LMA: classic    LMA Size: 4  ----------------------------------------------------------------------------------------------------------------------------------------

## 2020-07-31 NOTE — Op Note (Addendum)
Operative Note (Surgical Case/Log ID: [id number])       Date of Surgery: 07/31/2020       Surgeons: Surgeon(s) and Role:     * Eisha Chatterjee, Donetta Potts, MD - Primary     * Marlene Bast, MD - Resident - Assisting       Pre-op Diagnosis: Pre-Op Diagnosis Codes:     * Thickened endometrium [R93.89]       Post-op Diagnosis: Post-Op Diagnosis Codes:     * Thickened endometrium [R93.89]       Procedure(s) Performed: Procedure(s) (LRB):  D & C HYSTEROSCOPY WITH MYOSURE, POLYPECTOMY (N/A)       Anesthesia Type: General        Fluid Totals: I/O this shift:  12/01 0700 - 12/01 1459  In: 1000 (11.2 mL/kg) [I.V.:1000]  Out: 5 (0.1 mL/kg) [Blood:5]  Net: 995  Weight: 89.4 kg        Estimated Blood Loss: 5 mL       Specimens to Pathology:  ID Type Source Tests Collected by Time Destination   A : ENDOMETRIAL CURETTINGS AND POLYP TISSUE Uterus SURGICAL PATHOLOGY Patricia Nettle, RN 07/31/2020 0820           Temporary Implants: none       Packing:  none               Patient Condition: good       Indications: Robin Green is a G81P5 64 y.o. female who has a hx of an abnormal pap with endometrial cells on 04/23/20.  She had an USN that showed a thickened endometrium of 2.2cm that was complex in appearance with several echolucent areas.  She was seen counseled and consented in the office for an operative hysteroscopy with polypectomy and D&C.  She presents today for scheduled procedure.        Findings (Including unexpected complications): Exam under anesthesia revealed normal appearing postmenopausal external genitalia, urethra, vagina and multiparous cervix, fingertip dilated at external os.  Cystocele present w/ apical component, asymptomatic.  Mobile, anteverted uterus with typical contour.  No adnexal masses.      Hysteroscopy revealed endometrial cavity with large fundal polyp. Pictures taken to document polyp and cavity. Right tubal ostia visualized and left partially visualized behind the polyp.     Description of  Procedure:  The patient was taken to the operating room and stop check was performed confirming the patient and procedure to be completed.  General anesthesia was administered without difficulty.  SCDs applied. The patient was positioned in dorsal lithotomy position, prepped and draped in the usual sterile fashion.  The surgical pause was completed.     Two handheld retractors were placed in the patient's vagina, the cervix was visualized and a single tooth tenaculum was applied to the anterior lip of the cervix at 12 o'clock.  A cervical block was placed with 20cc of 1% lidocaine at 4 and 8 o'clock.  The cervix was progressively dilated to a 24 Hank, 8 Hegar dilator.  The uterus was gently sounded to 10 cm.  The hysteroscope was inserted into the cervical canal and advanced with visualization of the uterine body, fundus and tubal ostia.  An endometrial polyp was noted on the fundal uterine wall, filling most of the cavity.  Pictures were taken to document findings.  The hysteroscopic morcellator was prepared and primed according to the manufacturer's instructions.  The morcellator was then advanced through the operative channel into the uterine cavity and the polyp  was successfully removed using morcellation.  Pictures were taken to document satisfactory results.  The morecellator and hysteroscope were removed with minimal bleeding noted.  A sharp curettage was then performed.  The tenaculum was removed with good hemostasis noted.  The specimen was labeled and sent to pathology.    All sponge, needle and instrument counts were correct at the end of the procedure.  The patient tolerated the procedure well, anesthesia was reversed and she was taken to the Post Anesthesia Care Unit in stable and satisfactory condition.      Dr. Alberteen Sam was scrubbed for entire procedure.    Marcelline Deist, MD  OBGYN Resident, PGY-3  Pager [phone]            Signed:  Marlene Bast, MD  on 07/31/2020 at 8:56 AM     I was present  throughout the entire procedure.    Charyl Dancer, MD

## 2020-07-31 NOTE — Anesthesia Preprocedure Evaluation (Signed)
Anesthesia Pre-operative History and Physical for Robin Green  .  .  CPM/PAT Summary:  64 y/o h/o dm2, hld, htn, obesity, anxiety,  here for uterine polyp removal. By Iantha Fallen, MD at 7:47 AM on 07/31/2020    Anesthesia Evaluation Information Source: patient, records     ANESTHESIA HISTORY     Denies anesthesia history    GENERAL    + Obesity    HEENT    + Hearing Loss PULMONARY     Denies pulmonary issues    CARDIOVASCULAR    + Hypertension     NEURO/PSYCH    + Neuropsychiatric Issues          anxiety    ENDO/OTHER    + Diabetes Mellitus    HEMATOLOGIC    + Blood dyscrasia          hyperlipidemia    + Arthritis         Physical Exam    Airway            Mouth opening: normal            Mallampati: II  Dental   Normal Exam   Cardiovascular           Rate: normal         Pulmonary     No cough    Mental Status     oriented to person, place and time         ________________________________________________________________________  PLAN  ASA Score  3  Anesthetic Plan general     Induction (routine IV) General Anesthesia/Sedation Maintenance Plan (inhaled agents);  Airway Manipulation (direct laryngoscopy); Airway (LMA); Line ( use current access); Monitoring (standard ASA); Positioning (lithotomy); PONV Plan (dexamethasone and ondansetron); Pain (per surgical team); PostOp (PACU)    Informed Consent     Risks:         Risks discussed were commensurate with the plan listed above with the following specific points: N/V, aspiration, sore throat, hypotension, headache and dizziness, Damage to: teeth, eyes and nerves, allergic Rx, unexpected serious injury, awareness and death.    Anesthetic Consent:         Anesthetic plan (and risks as noted above) were discussed with patient    Responsible Anesthesia Provider Attestation:  I attest that the patient or proxy understands and accepts the risks and benefits of the anesthesia plan. I also attest that I have personally performed a pre-anesthetic examination and evaluation, and  prescribed the anesthetic plan for this particular location within 48 hours prior to the anesthetic as documented. Iantha Fallen, MD  07/31/20, 7:47 AM

## 2020-07-31 NOTE — Anesthesia Case Conclusion (Signed)
CASE CONCLUSION  Emergence  Actions:  LMA removed  Criteria Used for Airway Removal:  Adequate Tv & RR, acceptable O2 saturation and following commands  Assessment:  Routine  Transport  Directly to: PACU  Position:  Supine  Patient Condition on Handoff  Level of Consciousness:  Mildly sedated  Patient Condition:  Stable  Handoff Report to:  RN

## 2020-07-31 NOTE — Progress Notes (Signed)
UPDATES TO PATIENT'S CONDITION on the DAY OF SURGERY/PROCEDURE    I. Updates to Patient's Condition (to be completed by a provider privileged to complete a H&P, following reassessment of the patient by the provider):    Day of Surgery/Procedure Update:  History  History reviewed and no change    Physical  Physical exam updated and no change            II. Procedure Readiness   I have reviewed the patient's H&P and updated condition. By completing and signing this form, I attest that this patient is ready for surgery/procedure.    III. Attestation   I have reviewed the updated information regarding the patient's condition and it is appropriate to proceed with the planned surgery/procedure.    I have reminded Ms. Luscher that COVID-19 is still present in our community. She was advised that [facility] and its affiliates have made deliberate and widespread changes to policies and procedures, consistent with applicable directives, in order to reduce the risk of exposure in our facilities.     I further explained and Ms. Fabre understands that given the communicability of the SARS CoV2 coronavirus, there remains a small but real risk of contracting the disease while receiving perioperative care - even with stringent preventive measures in place.   Ms. Kalmar understands the potential consequences of COVID-19 disease as it relates to their planned procedure and anticipated postoperative course.      The patient and I have considered and discussed the relative risks and benefits of proceeding with his/her surgery - both in terms of the procedure itself, and also in the context of the ongoing pandemic.  Ms. Randleman wishes to proceed with the procedure.     Charyl Dancer, MD as of 7:22 AM 07/31/2020

## 2020-08-01 ENCOUNTER — Encounter: Payer: Self-pay | Admitting: Obstetrics

## 2020-08-01 LAB — SURGICAL PATHOLOGY

## 2020-08-01 NOTE — Anesthesia Postprocedure Evaluation (Signed)
Anesthesia Post-Op Note    Patient: Robin Green    Procedure(s) Performed:  Procedure Summary  Date:  07/31/2020 Anesthesia Start: 07/31/2020  7:30 AM Anesthesia Stop: 07/31/2020  8:40 AM Room / Location:  [facility] / [facility] MAIN OR   Procedure(s):  D & C HYSTEROSCOPY WITH MYOSURE, POLYPECTOMY Diagnosis:  Thickened endometrium [R93.89] Surgeon(s):  Pulli, Donetta Potts, MD  Marlene Bast, MD Responsible Anesthesia Provider:  Iantha Fallen, MD         Recovery Vitals  BP: 155/78 (07/31/2020 10:54 AM)  Heart Rate: 79 (07/31/2020  9:00 AM)  Heart Rate (via Pulse Ox): 65 (07/31/2020 10:54 AM)  Resp: 16 (07/31/2020 10:54 AM)  Temp: 37 C (98.6 F) (07/31/2020 10:54 AM)  SpO2: 100 % (07/31/2020 10:54 AM)   0-10 Scale: 0 (07/31/2020 10:54 AM)    Anesthesia type:  general  Complications Noted During Procedure or in PACU:  No value filed.   Comment: No value filed.  Patient Location:  PACU  Level of Consciousness:    Recovered to baseline, awake, oriented and alert  Patient Participation:     Able to participate  Temperature Status:    Normothermic  Oxygen Saturation:    Within patient's normal range  Cardiac Status:   Within patient's normal range  Fluid Status:    Stable  Airway Patency:     Yes  Pulmonary Status:    Baseline  Pain Management:    Adequate analgesia  Nausea and Vomiting:  None    Post Op Assessment:    Tolerated procedure well  Attending Attestation:  All indicated post anesthesia care provided  Comments:    D/c home     -

## 2020-08-04 NOTE — Progress Notes (Deleted)
Robin Green     Robin Green is a 64 y.o. female who presents for No chief complaint on file.          ROS    MEDICATIONS     Current Outpatient Medications   Medication Sig    ALPRAZolam (XANAX) 0.25 mg tablet Take 1 tablet (0.25 mg total) by mouth 2 times daily as needed for Anxiety  Max daily dose: 0.5 mg    verapamil (CALAN-SR) 240 mg CR tablet Take 1 tablet (240 mg total) by mouth 2 times daily  Swallow whole. Do not crush, break, or chew.    ammonium lactate (AMLACTIN) 12 % cream Apply topically 2 times daily as needed for Dry Skin to the following areas: apply to feet daily (Patient not taking: Reported on 07/22/2020)    Ascorbic Acid (VITAMIN C) 500 MG CAPS Take 1,000 mg by mouth     CINNAMON PO Take by mouth    Zinc Sulfate (ZINC-220 PO) Take by mouth    Potassium 99 MG TABS Take by mouth     blood glucose test strip By no specified route 4 times daily Test 3-4 times a day. Freestyle Freedom. Dx: DM-2    TURMERIC PO Take 1 tablet by mouth daily    nystatin (MYCOSTATIN) ointment Apply topically 2 times daily as needed 30GM Use topically twice daily on affected area[s]    Melatonin 3 MG CAPS Take 3 mg by mouth nightly as needed  (Patient not taking: Reported on 07/22/2020)    amoxicillin (AMOXIL) 500 MG capsule Prior to dental procedures    blood glucose monitor (FREESTYLE FREEDOM LITE) kit Use daily for BG testing    Non-System Medication Abbott freestyle lifestyle touch dx: DM-2    Non-System Medication BP cuff, large Dx: HTN    cholecalciferol (VITAMIN D) 1000 UNIT tablet Take 2,000 Units by mouth daily          Patient's medications were reviewed and updated at today's visit and no changes were made    ALLERGIES     Nifedipine, Seafood, and Lisinopril      Patient's allergies were reviewed and updated at today's visit and no changes were made    PROBLEM LIST     Patient Active Problem List   Diagnosis Code    Sudden hearing loss H91.20    Obesity  (BMI 30-39.9) E66.9    Type 2 diabetes mellitus E11.9    Carpal tunnel syndrome G56.00    HTN (hypertension) I10    Osteoarthritis, knee M17.10    Presence of right artificial knee joint Z96.651    Hyperlipidemia, mixed E78.2    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    General Appearance: No distress    Physical Exam      RECENT LABS     Recent Results (from the past 336 hour(s))   COVID-19 PCR    Collection Time: 07/27/20 11:26 AM  Result Value Ref Range    COVID-19 Source Nasal     COVID-19 PCR NEG NEG   POCT glucose    Collection Time: 07/31/20  6:36 AM   Result Value Ref Range    Glucose POCT 103 (H) 60 - 99 mg/dL   Basic metabolic panel    Collection Time: 07/31/20  6:37 AM   Result Value Ref Range    Glucose 108 (H) 60 - 99 mg/dL    Sodium 140 133 - 145 mmol/L    Potassium 4.2 3.3 - 5.1 mmol/L    Chloride 101 96 - 108 mmol/L    CO2 25 20 - 28 mmol/L    Anion Gap 14 7 - 16    UN 14 6 - 20 mg/dL    Creatinine 0.87 0.51 - 0.95 mg/dL    GFR,Caucasian 70 *    GFR,Black 81 *    Calcium 9.8 8.6 - 10.2 mg/dL   Surgical Pathology    Collection Time: 07/31/20  8:20 AM   Result Value Ref Range    Surgical Pathology       [id number]  Additional Copy to:  Earna Coder, MD    FINAL DIAGNOSIS:  Uterine endometrium, curetting:   - Endometrial polyp.   - Inactive endometrium.   - No glandular hyperplasia or malignancy identified.  MICROSCOPIC EXAMINATION: Performed    INDICATIONS FOR PROCEDURE/CLINICAL HISTORY:  Thickened endometrium [R93.89]    GROSS DESCRIPTION:  Received in formalin, labeled "Robin Green, Robin Green" and  "endometrial  curettings and polyp," is a 4.2 x 3.1 x 0.5 cm aggregate of multiple  irregular tan-pink soft tissue fragments admixed with minimal dark red  blood clot.  The specimen is filtered and entirely submitted in seven  green cassettes labeled [id number] A1-A7.    Summary of sections:  A1-A7 - multiple pieces each.   EAB1          Reported Date:          Reviewing Pathologist's Electronic Signature:  08/01/2020              Theophilus Bones, M.D.    Applicable only if cytogenetics or molecular diagnostics test results  are included in this report:  Unless otherwise noted, testing performed  by [facility] (Applicable only if flow cytometry and/or  immunohistochemistry results are included in this report):  These tests  were developed and their performance characteristics determined by [facility], [address] [place],  [place], [place] [address]. The interpretation of the above  immunohistochemistry stain or stains is guided by published results in  the medical literature, provided package information from the  manufacturer and by internal review of staining performance and assay  validation within the immunohistochemistry laboratory.  This testing  has not been cleared or approved by the U.S. Food and Drug  Administration (FDA).  The FDA has determined that such clearance or  approval is not necessary.  These tests are used for clinical purposes  and should not be regarded as investigational or research.  Unless otherwise noted, Immunohistochemistry stains were performed at  [facility], [address], [place],  [place], [place] [address].     POCT glucose    Collection Time: 07/31/20  8:33 AM   Result Value Ref Range    Glucose POCT 91 60 - 99 mg/dL   POCT  glucose    Collection Time: 07/31/20  9:56 AM   Result Value Ref Range    Glucose POCT 90 60 - 99 mg/dL             ASSESSMENT/PLAN         No follow-ups on file.    --Patient instructed to call if symptoms are not improving or worsening  --Follow-up arranged    Signed: Earna Coder, MD on 08/04/2020 at 5:40 PM

## 2020-08-05 ENCOUNTER — Encounter: Payer: Self-pay | Admitting: Primary Care

## 2020-08-05 ENCOUNTER — Ambulatory Visit: Payer: Medicare (Managed Care) | Admitting: Primary Care

## 2020-08-08 NOTE — Progress Notes (Signed)
Pre-Visit Planning    Health Maintenance Due   Topic Date Due    IMM-ZOSTER (2 of 3) 11/08/2012    IMM-INFLUENZA (1) 05/01/2020    COVID-19 Vaccine (3 - Booster for Pfizer series) 06/12/2020    Diabetic A1C Monitoring Other  07/05/2020       Lab Results   Component Value Date    HA1C 6.7 (H) 04/04/2020     Lab Results   Component Value Date    MALBR <1.20 04/04/2020       Notes:  - Last Mammogram: 03/27/2020     Completed on 08/08/20 by Tanna Savoy

## 2020-08-09 ENCOUNTER — Ambulatory Visit: Payer: Medicare (Managed Care) | Admitting: Primary Care

## 2020-08-14 ENCOUNTER — Other Ambulatory Visit
Admission: RE | Admit: 2020-08-14 | Discharge: 2020-08-14 | Disposition: A | Discharge status: Home / Self Care | Payer: Medicare (Managed Care) | Source: Ambulatory Visit | Attending: Primary Care | Admitting: Primary Care

## 2020-08-14 DIAGNOSIS — Z01818 Encounter for other preprocedural examination: Secondary | ICD-10-CM | POA: Insufficient documentation

## 2020-08-14 DIAGNOSIS — E119 Type 2 diabetes mellitus without complications: Secondary | ICD-10-CM

## 2020-08-14 LAB — COMPREHENSIVE METABOLIC PANEL
ALT: 18 U/L (ref 0–35)
AST: 19 U/L (ref 0–35)
Albumin: 4.2 g/dL (ref 3.5–5.2)
Alk Phos: 54 U/L (ref 35–105)
Anion Gap: 11 (ref 7–16)
Bilirubin,Total: 0.5 mg/dL (ref 0.0–1.2)
CO2: 28 mmol/L (ref 20–28)
Calcium: 9.9 mg/dL (ref 8.6–10.2)
Chloride: 101 mmol/L (ref 96–108)
Creatinine: 0.96 mg/dL — ABNORMAL HIGH (ref 0.51–0.95)
GFR,Black: 72 *
GFR,Caucasian: 62 *
Glucose: 106 mg/dL — ABNORMAL HIGH (ref 60–99)
Lab: 19 mg/dL (ref 6–20)
Potassium: 4.1 mmol/L (ref 3.3–5.1)
Sodium: 140 mmol/L (ref 133–145)
Total Protein: 7 g/dL (ref 6.3–7.7)

## 2020-08-14 LAB — LIPID PANEL
Chol/HDL Ratio: 2.4
Cholesterol: 223 mg/dL — AB
HDL: 94 mg/dL — ABNORMAL HIGH (ref 40–60)
LDL Calculated: 117 mg/dL
Non HDL Cholesterol: 129 mg/dL
Triglycerides: 59 mg/dL

## 2020-08-14 LAB — MICROALBUMIN, URINE, RANDOM
Creatinine,UR: 36 mg/dL (ref 20–300)
Microalbumin,UR: <1.2 mg/dL

## 2020-08-15 LAB — HEMOGLOBIN A1C: Hemoglobin A1C: 6.6 % — ABNORMAL HIGH

## 2020-08-15 NOTE — Progress Notes (Signed)
[place]     Robin Green is a 64 y.o. female who presents for Follow-up, Lab Results, and Hypertension      Patient seen for follow-up of the following chronic medical problems  Type 2 diabetes-well-controlled on current diet  Hypertension-has been taking verapamil twice a day along with hydrochlorothiazide 25 mg a day and doing well.  No headache or vision problems  Hyperlipidemia-no chest pain  Obesity-has lost 2 pounds since her last visit with me.    Interim history reviewed.    Review of Systems   Constitutional: Negative for chills, fever and weight loss.   HENT: Negative for sore throat.    Respiratory: Negative for cough, shortness of breath and wheezing.    Cardiovascular: Negative for chest pain and palpitations.   Skin: Negative for rash.       MEDICATIONS     Current Outpatient Medications   Medication Sig    hydroCHLOROthiazide (HYDRODIURIL) 12.5 MG tablet Take 12.5 mg by mouth daily  Taking 2 tablet = 57m qd    verapamil (CALAN-SR) 240 mg CR tablet Take 1 tablet (240 mg total) by mouth 2 times daily  Swallow whole. Do not crush, break, or chew.    CINNAMON PO Take by mouth    Potassium 99 MG TABS Take by mouth     blood glucose test strip By no specified route 4 times daily Test 3-4 times a day. Freestyle Freedom. Dx: DM-2    TURMERIC PO Take 1 tablet by mouth daily    blood glucose monitor (FREESTYLE FREEDOM LITE) kit Use daily for BG testing    Non-System Medication Abbott freestyle lifestyle touch dx: DM-2    Non-System Medication BP cuff, large Dx: HTN    cholecalciferol (VITAMIN D) 1000 UNIT tablet Take 2,000 Units by mouth daily       ALPRAZolam (XANAX) 0.25 mg tablet Take 1 tablet (0.25 mg total) by mouth 2 times daily as needed for Anxiety  Max daily dose: 0.5 mg    Ascorbic Acid (VITAMIN C) 500 MG CAPS Take 1,000 mg by mouth     Zinc Sulfate (ZINC-220 PO) Take by mouth    nystatin (MYCOSTATIN) ointment Apply topically 2 times daily  as needed 30GM Use topically twice daily on affected area[s]    Melatonin 3 MG CAPS Take 3 mg by mouth nightly as needed  (Patient not taking: Reported on 07/22/2020)    amoxicillin (AMOXIL) 500 MG capsule Prior to dental procedures       Patient's medications were reviewed and updated at today's visit and no changes were made    ALLERGIES     Nifedipine, Seafood, and Lisinopril      Patient's allergies were reviewed and updated at today's visit and no changes were made    PROBLEM LIST     Patient Active Problem List   Diagnosis Code    Sudden hearing loss H91.20    Obesity (BMI 30-39.9) E66.9    Type 2 diabetes mellitus E11.9    Carpal tunnel syndrome G56.00    HTN (hypertension) I10    Osteoarthritis, knee M17.10    Presence of right artificial knee joint Z96.651    Hyperlipidemia, mixed E78.2    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   Constitutional:       Comments: Pleasant lady in no distress, wearing a mask   HENT:      Head: Normocephalic and atraumatic.      Right Ear: External ear normal.      Left Ear: External ear normal.      Nose: Nose normal.   Eyes:      Conjunctiva/sclera: Conjunctivae normal.   Cardiovascular:      Rate and Rhythm: Normal rate and regular rhythm.      Heart sounds: Normal heart sounds. No murmur heard.      Pulmonary:      Effort: Pulmonary effort is normal. No respiratory distress.      Breath sounds: Normal breath sounds. No wheezing.   Neurological:      Mental Status: She is alert.    Psychiatric:         Mood and Affect: Mood normal.           RECENT LABS     Recent Results (from the past 336 hour(s))   Microalbumin, Urine, Random    Collection Time: 08/14/20  3:21 PM   Result Value Ref Range    Creatinine,UR 36 20 - 300 mg/dL    Microalbumin,UR <1.20 mg/dL    Microalb/Creat Ratio see below 0.0 - 29.9 mg MA/g CR   Lipid Panel (Reflex to Direct  LDL if Triglycerides more than 400)    Collection Time: 08/14/20  3:21 PM   Result Value Ref Range    Cholesterol 223 (!) mg/dL    Triglycerides 59 mg/dL    HDL 94 (H) 40 - 60 mg/dL    LDL Calculated 117 mg/dL    Non HDL Cholesterol 129 mg/dL    Chol/HDL Ratio 2.4    Comprehensive metabolic panel    Collection Time: 08/14/20  3:21 PM   Result Value Ref Range    Sodium 140 133 - 145 mmol/L    Potassium 4.1 3.3 - 5.1 mmol/L    Chloride 101 96 - 108 mmol/L    CO2 28 20 - 28 mmol/L    Anion Gap 11 7 - 16    UN 19 6 - 20 mg/dL    Creatinine 0.96 (H) 0.51 - 0.95 mg/dL    GFR,Caucasian 62 *    GFR,Black 72 *    Glucose 106 (H) 60 - 99 mg/dL    Calcium 9.9 8.6 - 10.2 mg/dL    Total Protein 7.0 6.3 - 7.7 g/dL    Albumin 4.2 3.5 - 5.2 g/dL    Bilirubin,Total 0.5 0.0 - 1.2 mg/dL    AST 19 0 - 35 U/L    ALT 18 0 - 35 U/L    Alk Phos 54 35 - 105 U/L            ASSESSMENT/PLAN     1. Type 2 diabetes mellitus  A1c is slightly better- at goal.  Patient adamant to be aggressive  with diet and exercise.  We will continue to follow low-carb diet and exercise 30 minutes most days of the week.  Continue to work on weight loss.  Recheck labs as ordered.  - Hemoglobin A1c; Future  - Comprehensive metabolic panel; Future  - Lipid Panel (Reflex to Direct  LDL if Triglycerides more than 400); Future  - Microalbumin, Urine, Random; Future    2. Essential hypertension  Blood pressure is well controlled.  Continue verapamil to 40 mg 2 times a day as well as hydrochlorothiazide 25 mg a day.  Follow low-salt diet and exercise 30 minutes most days of the week.  Continue to work on  weight loss.  Goal blood pressure 130/80 or less.  Labs reviewed with patient.    3. Hyperlipidemia, mixed  The 10-year ASCVD risk score Mikey Bussing DC Jr., et al., 2013) is: 21.5%    Values used to calculate the score:      Age: 15 years      Sex: Female      Is Non-Hispanic African American: Yes      Diabetic: Yes      Tobacco smoker: No      Systolic Blood Pressure: 017 mmHg      Is BP treated: Yes      HDL Cholesterol: 94 mg/dL      Total Cholesterol: 223 mg/dL     Follow low-cholesterol diet and exercise 30 minutes most days of the week.  Continue to work on weight loss.  Urged to consider statins.  Patient declines.  Understands risk for MI and stroke.  Wants to pursue aggressive lifestyle measures and have labs rechecked again in 3 to 4 months.    4. Obesity (BMI 30-39.9)  Continue to work on wt loss    Return if symptoms worsen or fail to improve.    --Patient instructed to call if symptoms are not improving or worsening  --Follow-up arranged    Signed: Earna Coder, MD on 08/17/2020 at 12:35 AM

## 2020-08-16 ENCOUNTER — Ambulatory Visit: Payer: Medicare (Managed Care) | Admitting: Primary Care

## 2020-08-16 ENCOUNTER — Encounter: Payer: Self-pay | Admitting: Primary Care

## 2020-08-16 VITALS — BP 132/76 | HR 67 | Wt 196.0 lb

## 2020-08-16 DIAGNOSIS — E669 Obesity, unspecified: Secondary | ICD-10-CM

## 2020-08-16 DIAGNOSIS — E119 Type 2 diabetes mellitus without complications: Secondary | ICD-10-CM

## 2020-08-16 DIAGNOSIS — E782 Mixed hyperlipidemia: Secondary | ICD-10-CM

## 2020-08-16 DIAGNOSIS — I10 Essential (primary) hypertension: Secondary | ICD-10-CM

## 2020-10-17 ENCOUNTER — Other Ambulatory Visit: Payer: Self-pay | Admitting: Primary Care

## 2020-10-17 DIAGNOSIS — E119 Type 2 diabetes mellitus without complications: Secondary | ICD-10-CM

## 2020-10-17 MED ORDER — FREESTYLE LITE TEST VI STRP *A*
ORAL_STRIP | Freq: Three times a day (TID) | 3 refills | Status: DC
Start: 2020-10-17 — End: 2020-10-24

## 2020-10-17 NOTE — Telephone Encounter (Signed)
Last appointment: 08/16/2020  Next appointment: 12/30/2020        Asking for freestyle lite test strips  Clarified type (read it off the box)

## 2020-10-24 MED ORDER — BLOOD GLUCOSE MONITOR SYSTEM KIT *A*
PACK | Freq: Every day | 0 refills | Status: DC
Start: 2020-10-24 — End: 2021-07-28

## 2020-10-24 MED ORDER — BLOOD GLUCOSE TEST VI STRP *A*
ORAL_STRIP | Freq: Three times a day (TID) | 5 refills | Status: DC
Start: 2020-10-24 — End: 2020-10-24

## 2020-10-24 MED ORDER — BLOOD GLUCOSE TEST VI STRP *A*
ORAL_STRIP | Freq: Three times a day (TID) | 5 refills | Status: DC
Start: 2020-10-24 — End: 2020-12-20

## 2020-10-24 MED ORDER — FREESTYLE LITE TEST VI STRP *A*
ORAL_STRIP | Freq: Three times a day (TID) | 3 refills | Status: DC
Start: 2020-10-24 — End: 2020-10-24

## 2020-10-24 MED ORDER — BLOOD GLUCOSE MONITOR SYSTEM KIT *A*
PACK | 0 refills | Status: DC
Start: 2020-10-24 — End: 2020-10-24

## 2020-10-24 NOTE — Addendum Note (Signed)
Addended by: Hetty Ely on: 10/24/2020 12:56 PM     Modules accepted: Orders

## 2020-10-24 NOTE — Telephone Encounter (Signed)
Patient needs to to talk to nurse regarding this script , needs a whole new script she spoke with insurance company

## 2020-10-24 NOTE — Telephone Encounter (Signed)
Call from [place] who states she went to the pharmacy who advises she was informed by the pharmacy that there is no diagnosis code on the script for the freestyle lite test strips and her insurance Medicare [employer] requires the diagnosis be on the script     Please add code and resend script per Pharmacy.

## 2020-10-24 NOTE — Addendum Note (Signed)
Addended by: Janit Bern A on: 10/24/2020 10:43 AM     Modules accepted: Orders

## 2020-10-24 NOTE — Telephone Encounter (Signed)
Updated script with DX code on it Please resend

## 2020-10-24 NOTE — Addendum Note (Signed)
Addended by: Danny Lawless on: 10/24/2020 12:38 PM     Modules accepted: Orders

## 2020-10-24 NOTE — Telephone Encounter (Signed)
Just spoke to [place]. She states that she called her insurance company and they will only cover "One Touch", please see new rx for meter and strips.

## 2020-10-30 ENCOUNTER — Other Ambulatory Visit: Payer: Self-pay | Admitting: Primary Care

## 2020-10-30 DIAGNOSIS — E119 Type 2 diabetes mellitus without complications: Secondary | ICD-10-CM

## 2020-10-30 MED ORDER — LANCETS MISC *A*
5 refills | Status: AC
Start: 2020-10-30 — End: ?

## 2020-10-30 NOTE — Telephone Encounter (Signed)
Last appointment: 08/16/2020  Next appointment: 02/28/2021      Pt received a new One Touch VERIO FLEX they only sent a few Lancets that come with the Monitor and she needs the Lancets for this particular Monitor she tests 2 to 3 times Daily Please order this item asap for Pt she needs any questions please call Pt

## 2020-12-06 ENCOUNTER — Ambulatory Visit: Payer: Medicare (Managed Care) | Admitting: Optometry

## 2020-12-11 ENCOUNTER — Encounter: Payer: Self-pay | Admitting: Optometry

## 2020-12-11 ENCOUNTER — Ambulatory Visit: Payer: Medicare (Managed Care) | Attending: Optometry | Admitting: Optometry

## 2020-12-11 VITALS — Ht 64.0 in | Wt 196.0 lb

## 2020-12-11 DIAGNOSIS — H524 Presbyopia: Secondary | ICD-10-CM

## 2020-12-11 DIAGNOSIS — H52203 Unspecified astigmatism, bilateral: Secondary | ICD-10-CM | POA: Insufficient documentation

## 2020-12-11 DIAGNOSIS — H40003 Preglaucoma, unspecified, bilateral: Secondary | ICD-10-CM

## 2020-12-11 DIAGNOSIS — E119 Type 2 diabetes mellitus without complications: Secondary | ICD-10-CM | POA: Insufficient documentation

## 2020-12-11 DIAGNOSIS — H5203 Hypermetropia, bilateral: Secondary | ICD-10-CM | POA: Insufficient documentation

## 2020-12-17 NOTE — Progress Notes (Signed)
Outpatient Visit      Patient name: Robin Green  DOB: 07/16/1956       Age: 65 y.o.  MR#: [id number]    Encounter Date: 12/11/2020    Subjective:      Chief Complaint   Patient presents with    Blurred Vision     good with glasses     HPI     Blurred Vision      Additional comments: good with glasses              Comments      Robin Green is a 65 y.o. female here for yearly exam to monitor history   of glaucoma suspect OU and cataracts OU.  C/o: patient states vision is doing well with her glasses on and denies   any eye pain, pressure or irritation. Patient denies flashes or floaters.    Ocular meds: none                Last edited by Christena Deem, OD on 12/11/2020 11:45 AM. (History)        has a current medication list which includes the following prescription(s): diclofenac, lancets, blood glucose monitor, hydrochlorothiazide, alprazolam, verapamil, cinnamon, zinc sulfate, potassium, turmeric, amoxicillin, non-system medication, cholecalciferol, blood glucose test strips, vitamin c, nystatin, melatonin, and non-system medication.     is allergic to nifedipine, seafood, and lisinopril.      Past Medical History:   Diagnosis Date    Anxiety     Diabetes     Diabetes mellitus     High blood pressure     Hypertension       Past Surgical History:   Procedure Laterality Date    BREAST BIOPSY  2008    CHOLECYSTECTOMY, LAPAROSCOPIC      JOINT REPLACEMENT      left TKR    KNEE REPLACEMENT  2008/2017    x2    PR HYSTEROSCOPY,W/ENDO BX N/A 07/31/2020    Procedure: D & C HYSTEROSCOPY WITH MYOSURE, POLYPECTOMY;  Surgeon: Charyl Dancer, MD;  Location: [facility];  Service: OBGYN        Specialty Problems        Ophthalmology Problems    Type 2 diabetes mellitus               ROS     Positive for: Eyes    Negative for: Constitutional, Gastrointestinal, Neurological, Skin,   Genitourinary, Musculoskeletal, HENT, Endocrine, Cardiovascular,   Respiratory, Psychiatric, Allergic/Imm, Heme/Lymph     Last edited by Angela Cox, COA on 12/05/2020  4:41 PM. (History)         Objective:     Base Eye Exam     Visual Acuity (Snellen - Linear)       Right Left    Dist cc 20/20 -2 20/20 -1    Near cc J1 ou    Correction: Glasses          Tonometry (Tonopen, 10:58 AM)       Right Left    Pressure 14 16          Pupils       Dark Light APD    Right 4 3 None    Left 4 3 None          Visual Fields       Left Right     Full Full          Extraocular Movement  Right Left     Full, Ortho Full, Ortho          Neuro/Psych     Oriented x3: Yes    Mood/Affect: Normal          Dilation     Both eyes: 2.5% Phenylephrine, 1.0% Tropicamide @ 10:58 AM            Slit Lamp and Fundus Exam     External Exam       Right Left    External Normal ocular adnexae, lacrimal gland & drainage, orbits Normal ocular adnexae, lacrimal gland & drainage, orbits          Slit Lamp Exam       Right Left    Lids/Lashes Normal structure & position Normal structure & position    Conjunctiva/Sclera Normal bulbar/palpebral, conjunctiva, sclera Normal bulbar/palpebral, conjunctiva, sclera    Cornea Normal epithelium, stroma, endothelium, tear film Normal epithelium, stroma, endothelium, tear film    Anterior Chamber Clear & deep Clear & deep    Iris Normal shape, size, morphology Normal shape, size, morphology    Lens 1+ Nuclear sclerosis, Trace Cortical cataract 1+ Nuclear sclerosis, Trace Cortical cataract    Vitreous Clear Clear          Fundus Exam       Right Left    Disc Normal size, appearance, nerve fiber layer Normal size, appearance, nerve fiber layer    C/D Ratio 0.3 0.3    Macula Normal, (-)CME Normal, (-)CME    Vessels rare crossing change, no DR Normal, (-)hemes, cws, exudates, irma, vb, NVE    Periphery Normal Normal            Refraction     Wearing Rx       Sphere Cylinder Axis Add    Right +1.50 +0.50 170 +2.25    Left +1.00 +1.00 158 +2.25    Age: 26yr    Type: PAL          Manifest Refraction       Sphere Cylinder Axis Dist VA  Add [place]    Right +1.50 +0.50 170 20/20 +2.50 J1+    Left +1.00 +1.00 159 20/20 +2.50 J1+   Pt stated 2.50 seemed more comfortable for reading           Final Rx       Sphere Cylinder Axis Dist VA Add [place]    Right +1.50 +0.50 170 20/20 +2.50 J1+    Left +1.00 +1.00 159 20/20 +2.50 J1+    Type: PAL    Expiration Date: 12/12/2022              Final Rx       Sphere Cylinder Axis Dist VA Add [place]    Right +1.50 +0.50 170 20/20 +2.50 J1+    Left +1.00 +1.00 159 20/20 +2.50 J1+    Type: PAL    Expiration Date: 12/12/2022                No annotated images are attached to the encounter.      Assessment/Plan:      1. 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 Diabetes mellitus type 2 without retinopathy     3. Hyperopia with astigmatism and presbyopia, bilateral           PLAN:  1. RNFL  Normal and stable OU  IOP remains low  and stable with h/o thicker than average cct ou  Check in 1 year with repeat oct.     2. Pt educated on findings and on the importance of maintaining good blood sugar control and follow up with PCP. Monitor with annual dilated exam.     3. New glasses rx dispensed.

## 2020-12-20 ENCOUNTER — Other Ambulatory Visit: Payer: Self-pay | Admitting: Primary Care

## 2020-12-25 ENCOUNTER — Telehealth: Payer: Self-pay | Admitting: Primary Care

## 2020-12-25 NOTE — Telephone Encounter (Signed)
Spoke to pt and she understood to make sure batteries our new and she reads the manual

## 2020-12-25 NOTE — Telephone Encounter (Signed)
Patient is going out of town Friday she wouldl like to talk to nurse she would like to make she her blood pressure cuff and glutose monitor are working right   Please advise

## 2020-12-26 ENCOUNTER — Encounter: Payer: Self-pay | Admitting: Gastroenterology

## 2020-12-30 ENCOUNTER — Ambulatory Visit: Payer: Medicare (Managed Care) | Admitting: Primary Care

## 2021-02-26 ENCOUNTER — Other Ambulatory Visit
Admission: RE | Admit: 2021-02-26 | Discharge: 2021-02-26 | Disposition: A | Discharge status: Home / Self Care | Payer: Medicare (Managed Care) | Source: Ambulatory Visit | Attending: Primary Care | Admitting: Primary Care

## 2021-02-26 DIAGNOSIS — E119 Type 2 diabetes mellitus without complications: Secondary | ICD-10-CM | POA: Insufficient documentation

## 2021-02-26 LAB — LIPID PANEL
Chol/HDL Ratio: 2.4
Cholesterol: 209 mg/dL — AB
HDL: 87 mg/dL — ABNORMAL HIGH (ref 40–60)
LDL Calculated: 110 mg/dL
Non HDL Cholesterol: 122 mg/dL
Triglycerides: 58 mg/dL

## 2021-02-26 LAB — MICROALBUMIN, URINE, RANDOM
Creatinine,UR: 43 mg/dL (ref 20–300)
Microalbumin,UR: <1.2 mg/dL

## 2021-02-26 LAB — COMPREHENSIVE METABOLIC PANEL
ALT: 18 U/L (ref 0–35)
AST: 22 U/L (ref 0–35)
Albumin: 4.2 g/dL (ref 3.5–5.2)
Alk Phos: 56 U/L (ref 35–105)
Anion Gap: 11 (ref 7–16)
Bilirubin,Total: 0.5 mg/dL (ref 0.0–1.2)
CO2: 27 mmol/L (ref 20–28)
Calcium: 9.2 mg/dL (ref 8.6–10.2)
Chloride: 96 mmol/L (ref 96–108)
Creatinine: 0.92 mg/dL (ref 0.51–0.95)
Glucose: 96 mg/dL (ref 60–99)
Lab: 15 mg/dL (ref 6–20)
Potassium: 4.1 mmol/L (ref 3.3–5.1)
Sodium: 134 mmol/L (ref 133–145)
Total Protein: 7.2 g/dL (ref 6.3–7.7)
eGFR BY CREAT: 69 *

## 2021-02-27 LAB — HEMOGLOBIN A1C: Hemoglobin A1C: 6.8 % — ABNORMAL HIGH

## 2021-02-28 ENCOUNTER — Ambulatory Visit: Payer: Medicare (Managed Care) | Admitting: Primary Care

## 2021-03-01 ENCOUNTER — Other Ambulatory Visit: Payer: Self-pay | Admitting: Primary Care

## 2021-03-04 NOTE — Telephone Encounter (Signed)
Last appointment: 08/16/2020  Next appointment: 03/17/2021

## 2021-03-17 ENCOUNTER — Ambulatory Visit: Payer: Medicare (Managed Care) | Admitting: Primary Care

## 2021-03-17 ENCOUNTER — Encounter: Payer: Self-pay | Admitting: Primary Care

## 2021-03-17 VITALS — BP 134/76 | HR 61 | Ht 64.0 in | Wt 193.0 lb

## 2021-03-17 DIAGNOSIS — E669 Obesity, unspecified: Secondary | ICD-10-CM

## 2021-03-17 DIAGNOSIS — F439 Reaction to severe stress, unspecified: Secondary | ICD-10-CM

## 2021-03-17 DIAGNOSIS — E782 Mixed hyperlipidemia: Secondary | ICD-10-CM

## 2021-03-17 DIAGNOSIS — Z13820 Encounter for screening for osteoporosis: Secondary | ICD-10-CM

## 2021-03-17 DIAGNOSIS — Z23 Encounter for immunization: Secondary | ICD-10-CM

## 2021-03-17 DIAGNOSIS — E119 Type 2 diabetes mellitus without complications: Secondary | ICD-10-CM

## 2021-03-17 DIAGNOSIS — M1710 Unilateral primary osteoarthritis, unspecified knee: Secondary | ICD-10-CM

## 2021-03-17 DIAGNOSIS — Z0000 Encounter for general adult medical examination without abnormal findings: Secondary | ICD-10-CM

## 2021-03-17 DIAGNOSIS — M179 Osteoarthritis of knee, unspecified: Secondary | ICD-10-CM

## 2021-03-17 DIAGNOSIS — G4700 Insomnia, unspecified: Secondary | ICD-10-CM

## 2021-03-17 DIAGNOSIS — I10 Essential (primary) hypertension: Secondary | ICD-10-CM

## 2021-03-17 LAB — HM DIABETES FOOT EXAM

## 2021-03-17 MED ORDER — PNEUMOCOCCAL 20-VAL CONJ VACC 0.5 ML IM SUSY *I*
0.5000 mL | PREFILLED_SYRINGE | Freq: Once | INTRAMUSCULAR | 0 refills | Status: DC
Start: 2021-03-17 — End: 2021-03-17

## 2021-03-17 MED ORDER — PNEUMOCOCCAL 20-VAL CONJ VACC 0.5 ML IM SUSY *I*
0.5000 mL | PREFILLED_SYRINGE | Freq: Once | INTRAMUSCULAR | 0 refills | Status: AC
Start: 2021-03-17 — End: 2021-03-17

## 2021-03-17 NOTE — Patient Instructions (Signed)
Thank you for completing your Annual Exam, Subsequent Annual Medicare Visit, and Lab Results   with [place] today.     The purpose of this visits was to:     Screen for disease   Assess risk of future medical problems   Help develop a healthy lifestyle   Update vaccines   Get to know your doctor in case of an illness    Patient Care Team:  Hetty Ely, MD as PCP - General (Primary Care)  Verita Schneiders, AUD as PCP - CCP-AUDIOLOGY     Medicare 5 Year Plan    The following items were identified as areas of concern during your screening today:  Diabetes - This is a risk factor for Heart Attack, Stroke, Kidney Problems, Eye Problems and a loss of feeling in your fingers and feet.   High Blood Pressure (Hypertension) - This is a risk factor for Heart Attack, Stroke, Kidney Problems and Eye Problems.   BMI greater than 25 - This is a risk for Heart Attack, Stroke, High Blood Pressure, Diabetes, High Cholesterol and other complications.       The Health Maintenance table below identifies screening tests and immunizations recommended by your health care team:  Health Maintenance: These screening recommendations are based on USPSTF, [employer], and [place] state guidelines   Topic Date Due    Osteoporosis Screening  Never done    Shingles Vaccine (2 of 3) 11/08/2012    Adult Prevnar Vaccination (1) Never done    Adult Pneumovax Vaccine (1) Never done    Diabetic Foot Exam  03/01/2021    HIV Screening  07/12/2021 (Originally 08/16/1969)    COVID-19 Vaccine (4 - Booster for Pfizer series) 03/28/2021    Flu Shot (1) 05/01/2021    Diabetic A1C Monitoring  05/29/2021    Colon Cancer Screening  06/26/2021    Diabetic Kidney Disease  02/26/2022    DEPRESSION SCREEN YEARLY  03/17/2022    Fall Risk Screening  03/17/2022    Breast Cancer Screening  03/27/2022    Diabetic Eye Exam  12/12/2022    Cervical Cancer Screening   04/24/2023    Hepatitis C Screening  Completed     In addition, goals and orders  placed to address these recommendations are listed in the "Today's Visit" section.    We wish you the best of health and look forward to seeing you again next year for your Annual Medicare Wellness Visit.     If you have any health care concerns before then, please do not hesitate to contact us.

## 2021-03-17 NOTE — Progress Notes (Signed)
Robin Green  Progress Note    Reason For Visit   Adult Comprehensive Physical Exam.    HPI   Today has concerns of: seen for routine physical. Doing well. Requesting to see a therapist- preferably a black woman-    The Problem List, MHx, SHx, FHx, Immunizations, Medications, and Allergies were reviewed with the patient today during the appointment and updates were made today.    Allergies   Allergen Reactions    Nifedipine Swelling    Seafood Itching    Lisinopril Other (See Comments)     Dizziness and cough      Current Outpatient Medications   Medication Sig Dispense Refill    UNABLE TO FIND Take 3 tablets by mouth 2 times daily  Med Name: Neocell Super (Collagen, Vit C and Biotin )      hydroCHLOROthiazide (HYDRODIURIL) 12.5 MG tablet TAKE 2 TABLETS (25 MG TOTAL) BY MOUTH DAILY 180 tablet 1    lancets Brand Verio Flex; Use three  times per day as directed for blood glucose testing. 100 each 5    blood glucose monitor device By no specified route daily  Use to test blood sugar as directed 1 each 0    verapamil (CALAN-SR) 240 mg CR tablet Take 1 tablet (240 mg total) by mouth 2 times daily  Swallow whole. Do not crush, break, or chew. 60 tablet 5    CINNAMON PO Take by mouth      Zinc Sulfate (ZINC-220 PO) Take by mouth      Potassium 99 MG TABS Take by mouth       TURMERIC PO Take 1 tablet by mouth daily      Non-System Medication BP cuff, large Dx: HTN      cholecalciferol (VITAMIN D) 1000 UNIT tablet Take 2,000 Units by mouth daily         pneumococcal 20-valent conjugate (PREVNAR 20) vaccine Inject 0.5 mLs into the muscle once for 1 dose 0.5 mL 0    ALPRAZolam (XANAX) 0.25 mg tablet Take 1 tablet (0.25 mg total) by mouth 2 times daily as needed for Anxiety  Max daily dose: 0.5 mg 60 tablet 0    amoxicillin (AMOXIL) 500 MG capsule Prior to dental procedures       No current facility-administered medications for this visit.     Patient Active Problem List    Diagnosis Date Noted     Thickened endometrium 07/31/2020    Status post hysteroscopy, polypectomy, D&C 07/31/2020    Acute pain of left shoulder 02/20/2020    Lumbar spondylosis 09/29/2018    Chronic left-sided low back pain with left-sided sciatica 12/16/2017    Hyperlipidemia, mixed 03/17/2017    Sudden hearing loss 02/25/2017    Obesity (BMI 30-39.9) 02/25/2017    Type 2 diabetes mellitus 02/25/2017    Carpal tunnel syndrome 02/25/2017    HTN (hypertension) 02/25/2017    Osteoarthritis, knee 02/25/2017    Presence of right artificial knee joint 12/25/2015     Past Medical History:   Diagnosis Date    Anxiety     Diabetes     Diabetes mellitus     High blood pressure     Hypertension      Past Surgical History:   Procedure Laterality Date    BREAST BIOPSY  2008    CHOLECYSTECTOMY, LAPAROSCOPIC      JOINT REPLACEMENT      left TKR    KNEE REPLACEMENT  2008/2017  x2    PR HYSTEROSCOPY,W/ENDO BX N/A 07/31/2020    Procedure: D & C HYSTEROSCOPY WITH MYOSURE, POLYPECTOMY;  Surgeon: Karmen Stabs, MD;  Location: [facility];  Service: OBGYN     Family History   Problem Relation Age of Onset    Breast cancer Mother     Diabetes Sister     Hypertension Sister     Diabetes Maternal Uncle     Coronary artery disease Maternal Uncle     Glaucoma Maternal Uncle     Prostate cancer Father     Dementia Father     Cataracts Maternal Aunt     Amblyopia ([place]) Neg Hx     Blindness Neg Hx     Heart Disease Neg Hx     Macular degeneration Neg Hx     Retinal detachment Neg Hx     Strabismus Neg Hx      Social History     Socioeconomic History    Marital status: Widowed     Spouse name: Not on file    Number of children: Not on file    Years of education: Not on file    Highest education level: Not on file   Tobacco Use    Smoking status: Never Smoker    Smokeless tobacco: Never Used   Substance and Sexual Activity    Alcohol use: No    Drug use: No    Sexual activity: Not Currently   Other Topics  Concern    Not on file   Social History Narrative    Not on file        ROS   Review of Systems   Constitutional: Negative for chills, fever and weight loss.   HENT: Negative for sore throat.    Eyes: Negative for blurred vision and double vision.   Respiratory: Negative for cough, shortness of breath and wheezing.    Cardiovascular: Negative for chest pain and palpitations.   Musculoskeletal: Positive for joint pain.   Skin: Negative for rash.   Psychiatric/Behavioral: The patient is nervous/anxious.        Vitals  Blood pressure 134/76, pulse 61, height 1.626 m ('5\' 4"' ), weight 87.5 kg (193 lb), SpO2 98 %.    Physical Exam  Physical Exam  Vitals reviewed.   Constitutional:       General: She is not in acute distress.     Appearance: She is not diaphoretic.      Comments: Pleasant lady in no distress, wearing a mask   HENT:      Head: Normocephalic and atraumatic.      Right Ear: Tympanic membrane, ear canal and external ear normal.      Left Ear: Tympanic membrane, ear canal and external ear normal.      Nose: Nose normal.      Mouth/Throat:      Mouth: Mucous membranes are moist.      Pharynx: Oropharynx is clear. No oropharyngeal exudate.   Eyes:      General: No scleral icterus.        Right eye: No discharge.         Left eye: No discharge.      Conjunctiva/sclera: Conjunctivae normal.      Pupils: Pupils are equal, round, and reactive to light.   Neck:      Thyroid: No thyromegaly.   Cardiovascular:      Rate and Rhythm: Normal rate and regular rhythm.  Heart sounds: Normal heart sounds. No murmur heard.    No gallop.   Pulmonary:      Effort: Pulmonary effort is normal. No respiratory distress.      Breath sounds: Normal breath sounds. No wheezing or rales.   Abdominal:      General: Bowel sounds are normal. There is no distension.      Palpations: Abdomen is soft. There is no mass.      Tenderness: There is no abdominal tenderness. There is no guarding or rebound.   Musculoskeletal:         General:  Normal range of motion.      Cervical back: Normal range of motion and neck supple.   Feet:      Comments: Normal bil pedal pulse. Normal monofilament exam bilaterally  Lymphadenopathy:      Cervical: No cervical adenopathy.   Skin:     General: Skin is warm and dry.   Neurological:      Mental Status: She is alert and oriented to person, place, and time.      Cranial Nerves: No cranial nerve deficit.   Psychiatric:         Mood and Affect: Mood and affect normal.         Lab Results  Lab Results   Component Value Date    WBC 5.9 04/04/2020    HGB 12.7 04/04/2020    HCT 39 04/04/2020    MCV 90 04/04/2020    PLT 299 04/04/2020       Chemistry        Lab results: 02/26/21  1631 08/14/20  1521   Sodium 134 140   Potassium 4.1 4.1   Chloride 96 101   CO2 27 28   GFR,Caucasian  --  62   GFR,Black  --  72   UN 15 19   Creatinine 0.92 0.96*        Lab results: 02/26/21  1631   Glucose 96   Calcium 9.2   Total Protein 7.2   Albumin 4.2   ALT 18   AST 22   Alk Phos 56   Bilirubin,Total 0.5          Lab Results   Component Value Date    CHOL 209 (!) 02/26/2021    HDL 87 (H) 02/26/2021    LDLC 110 02/26/2021    TRIG 58 02/26/2021    CHHDC 2.4 02/26/2021     Lab Results   Component Value Date    TSH 0.79 03/23/2019     Hemoglobin A1C   Date Value Ref Range Status   02/26/2021 6.8 (H) % Final     Comment:     Ref Range <=5.6  HbA1c values of 5.7-6.4% indicate an increased risk for developing  diabetes mellitus.  HbA1c values greater than or equal to 6.5% are diagnostic of  diabetes mellitus.  For diagnosis of diabetes in individuals without unequivocal  hyperglycemia, results should be confirmed by repeat testing.       EKG shows normal sinus rhythm at 50 bpm.  Normal P axis.  No ST-T changes.  Unchanged from prior EKG.    Assessment   This is a 65 y.o.female who comes in today for a physical exam    1. Preventative health Green  Reviewed healthy lifestyle measures. Up to date on routine preventive Green and immunizations.  Reviewed current guidelines re : nutrition ,sleep ,hydration and exercise.     2. Type 2  diabetes mellitus  Follow a low carb diet and exercise 30 mins most days of the week. Continue to work on wt loss. Declines medications. Will work aggressively on diet and exercise- admits to noncompliance. Recheck in 3-4 months  - Hemoglobin A1c; Future  - Comprehensive metabolic panel; Future  - Lipid Panel (Reflex to Direct  LDL if Triglycerides more than 400); Future  - Microalbumin, Urine, Random; Future    3. Essential hypertension  Follow a low salt diet and exercise 30 mins most days of the week. Continue to work on wt loss. Continue HCTZ at current dose. Goal Bp of 130/80 or less. Labs reviewed with patient     4. Hyperlipidemia, mixed  The 10-year ASCVD risk score Mikey Bussing DC Jr., et al., 2013) is: 21.3%    Values used to calculate the score:      Age: 45 years      Sex: Female      Is Non-Hispanic African American: Yes      Diabetic: Yes      Tobacco smoker: No      Systolic Blood Pressure: 222 mmHg      Is BP treated: Yes      HDL Cholesterol: 87 mg/dL      Total Cholesterol: 209 mg/dL     Follow a low cholesterol diet and exercise 30 mins most days of the week. Declines statins. Aware of risk for MI and stroke.     5. Obesity (BMI 30-39.9)  Continue to work on wt loss    6. Insomnia, unspecified type  Supportive Green    7. Osteoarthritis, knee  Reviewed notes from recent orthopedic visit.     8. Stress  See referral per patient request  - AMB REFERRAL TO PSYCHIATRY    9. Screening for osteoporosis  Continue calcium and vit D and weight bearing exercises like walking 2-3 times/week. See order for DEXA scan  - DEXA Scan; Future    10. Routine medical exam  Reviewed healthy lifestyle measures. Up to date on routine preventive Green and immunizations. Reviewed current guidelines re : nutrition ,sleep ,hydration and exercise.   - EKG 12 lead          Follow up: Follow up in about 4 months (around 07/18/2021) for  Follow-up.    Author: Earna Coder, MD  Note created: 03/17/2021  at: 10:44 PM

## 2021-03-17 NOTE — Progress Notes (Signed)
Visit performed as:             Office Visit, met with patient in person    Today we reviewed and updated Robin Smiths smoking status, activities of daily living, depression screen, fall risk, medications and allergies.   I have counseled the patient in the above areas.     Subjective:     Chief Complaint: Robin Green is a 65 y.o. female here for a/an Annual Exam, Subsequent Annual Medicare Visit, and Lab Results    In general, Robin Green rates their overall health as:  good      Patient Care Team:  Earna Coder, MD as PCP - General (Primary Care)  Noah Delaine, AUD as PCP - CCP-AUDIOLOGY     Current Outpatient Medications on File Prior to Visit   Medication Sig Dispense Refill    UNABLE TO FIND Take 3 tablets by mouth 2 times daily  Med Name: Neocell Super (Collagen, Vit C and Biotin )      hydroCHLOROthiazide (HYDRODIURIL) 12.5 MG tablet TAKE 2 TABLETS (25 MG TOTAL) BY MOUTH DAILY 180 tablet 1    lancets Brand Verio Flex; Use three  times per day as directed for blood glucose testing. 100 each 5    blood glucose monitor device By no specified route daily  Use to test blood sugar as directed 1 each 0    verapamil (CALAN-SR) 240 mg CR tablet Take 1 tablet (240 mg total) by mouth 2 times daily  Swallow whole. Do not crush, break, or chew. 60 tablet 5    CINNAMON PO Take by mouth      Zinc Sulfate (ZINC-220 PO) Take by mouth      Potassium 99 MG TABS Take by mouth       TURMERIC PO Take 1 tablet by mouth daily      Non-System Medication BP cuff, large Dx: HTN      cholecalciferol (VITAMIN D) 1000 UNIT tablet Take 2,000 Units by mouth daily         ALPRAZolam (XANAX) 0.25 mg tablet Take 1 tablet (0.25 mg total) by mouth 2 times daily as needed for Anxiety  Max daily dose: 0.5 mg 60 tablet 0    amoxicillin (AMOXIL) 500 MG capsule Prior to dental procedures       No current facility-administered medications on file prior to visit.     Allergies   Allergen Reactions    Nifedipine Swelling     Seafood Itching    Lisinopril Other (See Comments)     Dizziness and cough      Patient Active Problem List    Diagnosis Date Noted    Thickened endometrium 07/31/2020    Status post hysteroscopy, polypectomy, D&C 07/31/2020    Acute pain of left shoulder 02/20/2020    Lumbar spondylosis 09/29/2018    Chronic left-sided low back pain with left-sided sciatica 12/16/2017    Hyperlipidemia, mixed 03/17/2017    Sudden hearing loss 02/25/2017    Obesity (BMI 30-39.9) 02/25/2017    Type 2 diabetes mellitus 02/25/2017    Carpal tunnel syndrome 02/25/2017    HTN (hypertension) 02/25/2017    Osteoarthritis, knee 02/25/2017    Presence of right artificial knee joint 12/25/2015     Past Medical History:   Diagnosis Date    Anxiety     Diabetes     Diabetes mellitus     High blood pressure     Hypertension      Past Surgical  History:   Procedure Laterality Date    BREAST BIOPSY  2008    CHOLECYSTECTOMY, LAPAROSCOPIC      JOINT REPLACEMENT      left TKR    KNEE REPLACEMENT  2008/2017    x2    PR HYSTEROSCOPY,W/ENDO BX N/A 07/31/2020    Procedure: D & C HYSTEROSCOPY WITH MYOSURE, POLYPECTOMY;  Surgeon: Karmen Stabs, MD;  Location: [facility];  Service: OBGYN     Family History   Problem Relation Age of Onset    Breast cancer Mother     Diabetes Sister     Hypertension Sister     Diabetes Maternal Uncle     Coronary artery disease Maternal Uncle     Glaucoma Maternal Uncle     Prostate cancer Father     Dementia Father     Cataracts Maternal Aunt     Amblyopia (Lazy Eye) Neg Hx     Blindness Neg Hx     Heart Disease Neg Hx     Macular degeneration Neg Hx     Retinal detachment Neg Hx     Strabismus Neg Hx      Social History     Socioeconomic History    Marital status: Widowed     Spouse name: Not on file    Number of children: Not on file    Years of education: Not on file    Highest education level: Not on file   Occupational History    Not on file   Tobacco Use     Smoking status: Never Smoker    Smokeless tobacco: Never Used   Substance and Sexual Activity    Alcohol use: No    Drug use: No    Sexual activity: Not Currently   Social History Narrative    Not on file       Objective:     Vital Signs: BP 140/82    Pulse 61    Ht 1.626 m ('5\' 4"' )    Wt 87.5 kg (193 lb)    SpO2 98%    BMI 33.13 kg/m    BMI: Body mass index is 33.13 kg/m.    Vision Screening Results (Welcome visit only):  No exam data present    Depression Screening Results:  Recent Review Flowsheet Data     PHQ Scores 03/17/2021 01/31/2020 12/29/2019 12/29/2018 03/17/2017    PSQ2 Q1 - Interest/Pleasure - N N N N    PSQ2 Q2 - Down, Depressed, Hopeless - N N N N    PHQ Calculated Score 0 - - - -        Opioid Use/DAST- 10 Screening Results:   How many times in the past year have you used an illegal drug or used a prescription medication for nonmedical reasons?: 0 (03/17/2021 11:36 AM)    Activities of Daily Living/Functional Screening Results:  Is the person deaf or does he/she have serious difficulty hearing?: N  *Hearing Status: Hearing loss in left ear  Is this person blind or does he/she have serious difficulty seeing even when wearing glasses?: N  *Vision Status: Visual aid   Does this person have serious difficulty walking or climbing stairs?: N  Does this person have difficulty dressing or bathing?: N  *Shopping: Independent  *House Keeping: Independent  *Managing Own Medications: Independent  *Handling Finances: Independent  Difficulty doing errands due to a physicial, mental or emotional condition: No      Fall Risk Screening Results:  Have you fallen in the last year?: No  Did you sustain an injury which required medical attention?: No  Do you feel you are at risk for falling?: No      Assessment and Plan:     Cognitive Function:  Recall of recent and remote events appears:  Normal      Advanced Care Planning:  was discussed and the paperwork can be found in the scanned media section     The following  health maintenance plan was reviewed with the patient:    Health Maintenance Topics with due status: Overdue       Topic Date Due    Osteoporosis Screening USPSTF Never done    IMM-ZOSTER 11/08/2012    IMM-PREVNAR VACCINE 65 + YRS Never done    IMM-PNEUMOVAX VACCINE 56 + YRS Never done    Diabetic Foot Exam ADA 03/01/2021     Health Maintenance Topics with due status: Postponed       Topic Postponed Until    HIV Screening USPSTF/[place] 07/12/2021 (Originally 08/16/1969)     Health Maintenance Topics with due status: Not Due       Topic Last Completion Date    Colon Cancer Screening Other 06/26/2016    IMM-INFLUENZA 06/30/2019    Breast Cancer Screening Other 03/27/2020    Cervical Cancer Screening USPSTF 04/23/2020    COVID-19 Vaccine 11/26/2020    Diabetic Eye Exam ADA 12/11/2020    Diabetic Nephropathy Screening no ACE/ARB 02/26/2021    Diabetic A1C Monitoring Other 02/26/2021    DEPRESSION SCREEN YEARLY 03/17/2021    Fall Risk Screening 03/17/2021     Health Maintenance Topics with due status: Completed       Topic Last Completion Date    Hepatitis C Screening USPSTF/[place] 06/10/2016     This health maintenance schedule, identified risks, a list of orders placed today and patient goals have been provided to Robin Green in the after visit summary.     Plan for any concerns identified during screening or risk assessments:  n/a

## 2021-03-28 ENCOUNTER — Other Ambulatory Visit: Payer: Self-pay | Admitting: Gastroenterology

## 2021-04-01 ENCOUNTER — Encounter: Payer: Self-pay | Admitting: Foot & Ankle Surgery

## 2021-04-01 ENCOUNTER — Ambulatory Visit: Payer: Medicare (Managed Care) | Admitting: Foot & Ankle Surgery

## 2021-04-01 DIAGNOSIS — R234 Changes in skin texture: Secondary | ICD-10-CM

## 2021-04-01 DIAGNOSIS — E119 Type 2 diabetes mellitus without complications: Secondary | ICD-10-CM

## 2021-04-02 ENCOUNTER — Telehealth: Payer: Self-pay | Admitting: Foot & Ankle Surgery

## 2021-04-02 NOTE — Telephone Encounter (Signed)
Patient called stating a prescription for a cream/ointment was going to be sent to her pharmacy yesterday, is calling to inquire on the status. Pharmacy on file correct, please advise thank you!

## 2021-04-03 MED ORDER — AMMONIUM LACTATE 12 % EX CREA *I*
TOPICAL_CREAM | Freq: Two times a day (BID) | CUTANEOUS | 2 refills | Status: DC | PRN
Start: 2021-04-03 — End: 2021-05-14

## 2021-04-03 NOTE — Telephone Encounter (Signed)
Outside  mammo

## 2021-04-03 NOTE — Telephone Encounter (Signed)
Called patient and let her know.  

## 2021-04-04 ENCOUNTER — Other Ambulatory Visit: Payer: Self-pay | Admitting: Primary Care

## 2021-04-04 NOTE — Progress Notes (Signed)
SUBJECTIVE:    Chief Complaint   Patient presents with    Right Foot - Nail Problem, Follow-up, Diabetes    Left Foot - Diabetes, Nail Problem, Follow-up     dfc  a1c 6.8 in 01/2020  DM X 5 years, BG today AM=100  Wearing slip on shoes       65 year-old patient presents to clinic for diabetic foot check.  She's been diabetic for about 5 years.  Her last A1c was 6.8.  She denies any open cuts or sores.  She does admit to some fissuring on her heels.  She wonders if there is any medication that may be of benefit.  She denies any redness, swelling, drainage, positive she denies any fevers, chills, nausea, vomiting.  She has no new complaints today.     PMH, PSH, Family, Social, Meds, see above  Allergy:   Allergies   Allergen Reactions    Nifedipine Swelling    Seafood Itching    Lisinopril Other (See Comments)     Dizziness and cough         OBJECTIVE FINDINGS:     Patient is AO3, NAD  DP/PT 2/4 b/l, CFT <3 sec b/l  Sensation intact with SW 5.07g monofilament b/l  Motor fxn intact b/l  Muscle strength 5/5 b/l  Skin is warm, dry, supple.  Partial-thickness fissuring noted subfourth and fifth digits bilateral feet.  No erythema, edema or signs of infection appreciated.     No components found for: A1C     DIAGNOSIS:    Encounter Diagnoses   Name Primary?    Type 2 diabetes mellitus without complication, without long-term current use of insulin Yes    Encounter for diabetic foot exam     Fissure in skin of both feet                             PLAN:     - no clinically significant foot or ankle abnormalities  - optimize blood glucose control and blood pressure control and coordinated care and f/u visits with primary care provider  - detailed explanation of potential foot and ankle problems associated with diabetes  - Patient has been afforded the opportunity to ask questions. All questions answered. Patient verbalized understanding of all issues and instructions  - Debrided patient's nails without  incident  -Prescription for AmLactin cream provided to the patient  -Follow-up yearly for diabetic foot check     Thanks for the referral Dr. Rennis Harding    Answers for HPI/ROS submitted by the patient on 04/01/2021  What is your goal for today's visit?: For diabetic exam for foot  Date of onset: : 04/01/2021  Was this the result of an injury?: No  What is your pain level?: none  What diagnostic workup have you had for this condition?: no prior workup  What treatments have you tried for this condition?: no previous treatments  Is this a work related condition? : No  Current work status: : no work  Fever: No  Chills: No  Numbness: No  Tingling: No

## 2021-04-04 NOTE — Telephone Encounter (Signed)
Robin Green   Birth Date: [birth year]   Address: [address] [place], [place] [address]   Sex: Female   Rx Written Rx Dispensed Drug Quantity Days Supply Prescriber Name Prescriber Dea # Payment Method Dispenser   07/28/2020 08/13/2020 alprazolam 0.25 mg tablet  60 30 Hetty Ely MD [id number] [facility] [phone]     Last appointment: 03/17/2021  Next appointment: 07/18/2021

## 2021-04-11 ENCOUNTER — Other Ambulatory Visit: Payer: Self-pay | Admitting: Primary Care

## 2021-04-11 NOTE — Telephone Encounter (Signed)
Last office visit:   03/17/2021  Last telehome visit:   Visit date not found  Patients upcoming appointments:  Future Appointments   Date Time Provider [place]   05/28/2021  1:30 PM Margot Ables, AUD [facility] AUD   05/28/2021  2:00 PM Irish Elders, PA ENT None   07/18/2021  4:00 PM Earna Coder, MD PRA None   12/17/2021 10:30 AM Dasilva, Delray Alt, OD [facility] None   03/18/2022  3:00 PM Earna Coder, MD PRA None     BP Readings from Last 3 Encounters:   03/17/21 134/76   08/16/20 132/76   07/31/20 155/78      Recent Lab results:  [facility]     02/26/21  1631 08/14/20  1521 07/31/20  0637   NA 134 140 140   K 4.1 4.1 4.2   CL 96 101 101   CO2 _0 GAP _1 UN _2 CREAT 0.92 0.96* 0.87   GFRC  --  62 70   GFRB  --  72 81   GLU 96 106* 108*   CA 9.2 9.9 9.8      LIPID PROFILE   Recent Labs     02/26/21  1631 08/14/20  1521   CHOL 209* 223*   TRIG 58 59   HDL 87* 94*   LDLC 110 117      LIVER PROFILE   Recent Labs     02/26/21  1631 08/14/20  1521   ALT 18 18   AST 22 19   ALK 56 54   TB 0.5 0.5      DIABETES THYROID   Recent Labs     02/26/21  1631 08/14/20  1521   HA1C 6.8* 6.6*    No value within the past 365 days      Pending/Orders Labs:  Lab Frequency Next Occurrence   DEXA Scan Once 03/17/2021   Hemoglobin A1c Once 07/15/2021   Comprehensive metabolic panel Once 04/13/4817   Lipid Panel (Reflex to Direct  LDL if Triglycerides more than 400) Once 07/15/2021   Microalbumin, Urine, Random Once 07/15/2021

## 2021-04-14 ENCOUNTER — Other Ambulatory Visit: Payer: Self-pay | Admitting: Primary Care

## 2021-04-14 NOTE — Telephone Encounter (Signed)
Last appointment: 03/17/2021  Next appointment: 07/18/2021

## 2021-05-08 ENCOUNTER — Telehealth: Payer: Self-pay | Admitting: Primary Care

## 2021-05-08 NOTE — Telephone Encounter (Signed)
Not sure why she is asking to start this now.  Will need a visit to discuss risks and benefits.  Can use next available within the next 1 to 2 weeks.

## 2021-05-08 NOTE — Telephone Encounter (Signed)
Patient called stating that she will like to resume taking metformin 500 mg for diabetes. Patient is willing to be seen for evaluation if needed.        Please advise

## 2021-05-14 ENCOUNTER — Ambulatory Visit: Payer: Medicare (Managed Care) | Admitting: Primary Care

## 2021-05-14 ENCOUNTER — Encounter: Payer: Self-pay | Admitting: Primary Care

## 2021-05-14 VITALS — BP 136/70 | HR 55 | Ht 64.5 in | Wt 191.0 lb

## 2021-05-14 DIAGNOSIS — E119 Type 2 diabetes mellitus without complications: Secondary | ICD-10-CM

## 2021-05-14 MED ORDER — METFORMIN HCL 500 MG PO TABS *I*
500.0000 mg | ORAL_TABLET | Freq: Two times a day (BID) | ORAL | 5 refills | Status: DC
Start: 2021-05-14 — End: 2021-06-05

## 2021-05-14 NOTE — Progress Notes (Signed)
[facility]     HISTORY OF PRESENT ILLNESS     Robin Green is a 65 y.o. female who presents for Medication Management and Knee Pain    Patient seen for concerns of diabetes.  A1c is slightly worse on most recent blood work done 2 months ago.  Patient has decided to restart metformin due to this.  Tried it previously and is not sure if it caused GI distress or not.    Interim history reviewed      Review of Systems   Constitutional: Negative for chills and fever.   HENT: Negative for sore throat.    Respiratory: Negative for cough, shortness of breath and wheezing.    Cardiovascular: Negative for chest pain and palpitations.   Skin: Negative for rash.       MEDICATIONS     Current Outpatient Medications   Medication Sig    ONETOUCH VERIO test strip TEST THREE TIMES DAILY    verapamil (CALAN-SR) 240 mg CR tablet TAKE 1 TABLET (240 MG TOTAL) BY MOUTH 2 TIMES DAILY SWALLOW WHOLE. DO NOT CRUSH, BREAK, OR CHEW.    ALPRAZolam (XANAX) 0.25 mg tablet TAKE 1 TABLET (0.25 MG TOTAL) BY MOUTH 2 TIMES DAILY AS NEEDED FOR ANXIETY    UNABLE TO FIND Take 3 tablets by mouth 2 times daily  Med Name: Neocell Super (Collagen, Vit C and Biotin )    hydroCHLOROthiazide (HYDRODIURIL) 12.5 MG tablet TAKE 2 TABLETS (25 MG TOTAL) BY MOUTH DAILY    lancets Brand Verio Flex; Use three  times per day as directed for blood glucose testing.    blood glucose monitor device By no specified route daily  Use to test blood sugar as directed    CINNAMON PO Take by mouth    Zinc Sulfate (ZINC-220 PO) Take by mouth    Potassium 99 MG TABS Take by mouth     TURMERIC PO Take 1 tablet by mouth daily    Non-System Medication BP cuff, large Dx: HTN    cholecalciferol (VITAMIN D) 1000 UNIT tablet Take 2,000 Units by mouth daily       metFORMIN (GLUCOPHAGE) 500 mg tablet Take 1 tablet (500 mg total) by mouth 2 times daily (with meals)    amoxicillin (AMOXIL) 500 MG capsule Prior to dental procedures       Patient's medications were  reviewed and updated at today's visit and no changes were made    ALLERGIES     Nifedipine, Seafood, and Lisinopril      Patient's allergies were reviewed and updated at today's visit and no changes were made    PROBLEM LIST     Patient Active Problem List   Diagnosis Code    Sudden hearing loss H91.20    Obesity (BMI 30-39.9) E66.9    Type 2 diabetes mellitus E11.9    Carpal tunnel syndrome G56.00    HTN (hypertension) I10    Osteoarthritis, knee M17.10    Presence of right artificial knee joint Z96.651    Hyperlipidemia, mixed E78.2    Chronic left-sided low back pain with left-sided sciatica M54.42, G89.29    Lumbar spondylosis M47.816    Acute pain of left shoulder M25.512    Thickened endometrium R93.89    Status post hysteroscopy, polypectomy, D&C Z98.890         TOBACCO USE HISTORY     Social History     Tobacco Use   Smoking Status Never Smoker   Smokeless Tobacco Never  Used       PHYSICAL EXAM     BP 136/70    Pulse 55    Ht 1.638 m (5' 4.5")    Wt 86.6 kg (191 lb)    SpO2 97%    BMI 32.28 kg/m         Physical Exam  Vitals reviewed.   Constitutional:       Comments: Pleasant lady in no distress, wearing a mask   HENT:      Head: Normocephalic and atraumatic.      Right Ear: External ear normal.      Left Ear: External ear normal.      Nose: Nose normal.   Eyes:      Conjunctiva/sclera: Conjunctivae normal.   Pulmonary:      Effort: Pulmonary effort is normal.   Neurological:      Mental Status: She is alert.   Psychiatric:         Mood and Affect: Mood normal.           RECENT LABS   No results found for this or any previous visit (from the past 336 hour(s)).         ASSESSMENT/PLAN     1. Type 2 diabetes mellitus  Start metformin as prescribed.  Risks and benefits reviewed with patient.  Reassess with next blood work.  Continue to work on weight loss.  Continue to follow low-carb diet and exercise 30 minutes most days of the week    Follow up if symptoms worsen or fail to  improve.    --Patient instructed to call if symptoms are not improving or worsening  --Follow-up arranged    Signed: Hetty Ely, MD on 05/18/2021 at 8:19 PM

## 2021-05-28 ENCOUNTER — Encounter: Payer: Self-pay | Admitting: Otolaryngology

## 2021-05-28 ENCOUNTER — Other Ambulatory Visit: Payer: Self-pay

## 2021-05-28 ENCOUNTER — Ambulatory Visit: Payer: Medicare (Managed Care) | Admitting: Otolaryngology

## 2021-05-28 ENCOUNTER — Ambulatory Visit: Payer: Medicare (Managed Care) | Attending: Audiologist | Admitting: Audiologist

## 2021-05-28 VITALS — BP 136/70 | HR 56 | Temp 98.1°F | Ht 64.49 in | Wt 192.0 lb

## 2021-05-28 DIAGNOSIS — H9122 Sudden idiopathic hearing loss, left ear: Secondary | ICD-10-CM | POA: Insufficient documentation

## 2021-05-28 DIAGNOSIS — H903 Sensorineural hearing loss, bilateral: Secondary | ICD-10-CM | POA: Insufficient documentation

## 2021-05-28 NOTE — Progress Notes (Signed)
AUDIOLOGIC EVALUATION     [facility]  Audiology   [address] [place], [address]  [place],  [place] [address]  Phone: [phone], Fax: [phone]     Outpatient Visit  Patient: Robin Green   MR Number: [id number]   Date of Birth: 04-Apr-1956   Date of Visit: 05/28/2021      PURE-TONE TEST RESULTS  Type of Testing: conventional      Test Reliability: good  Transducer: insert  Booth: 2  ANSI S3.21.2004 (R2009)     Air Conduction Testing (dB HL and kHz)     LEFT EAR RIGHT EAR     0.125 0.25 0.50  0.75 1.0 1.5 2.0 3.0 4.0 6.0 8.0  0.125 0.25 0.50 0.75 1.0 1.5 2.0 3.0 4.0 6.0 8.0     15 40 70 80   95 100 95 95 85      15 10   20   20 20 25 20 5                                                                     Effective Masking Level           60 60 60 60 60 60 60 60                            Bone Conduction Testing (dB HL and kHz)  Bone conduction was masked when appropriate      0.25 0.50  0.75 1.0 1.5 2.0 3.0 4.0     0.25 0.50 0.75 1.0 1.5 2.0 3.0 4.0        40   70   90   NR     5 0   10   15   25                                                                       Effective Masking Level      60   85   85   85                            SPEECH AUDIOMETRY     SAT SRT Score dB HL EML  Test  SAT SRT Score dB HL EML  Test       16 % MCL@90  60 W-22 CD      92 % 50 30 W-22 CD   EML   EML            EML   EML               Please refer to scanned [facility] [id number] MR for additional information     Notes  Threshold in dB HL  Frequency in kiloHertz (kHz) Legend   dB=decibels  HL=Hearing Level  NR=No Response  VT=Vibro-Tactile EML=Effective Masking Level  SAT=Speech Awareness Threshold  SRT=Speech Reception  Threshold  MLV=Monitored  Live Voice  CD=Compact Disk       HISTORY: Referred by Theodosia Blender, PA for an audiological evaluation to determine whether patient is a candidate for medical, surgical or rehabilitative services. 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 Her most recent audiological evaluation on 06/02/2017 indicated mild to severe sensorineural hearing loss, left ear, and normal hearing sensitivity, right ear. Today, patient reports a possible improvement in hearing in her left ear. She denies any noticeable changes in her right ear. She also denies bothersome tinnitus, otalgia, aural fullness and vertigo.     FINDINGS: Pure-tone test results indicate a mild to profound sensorineural hearing loss in the left ear and normal/borderline normal hearing in the right ear. Hearing is essentially stable in both ears compared to results obtained on 06/02/2017, with the exception of a 20 dB improvement at 250 Hz in the left ear. Speech recognition ability in quiet was very poor, left ear, and excellent, right ear, when speech was presented at a very loud level, left ear, and at an average conversational level, right ear.    Above results were reviewed with patient. Patient was informed that she is not a strong candidate for a traditional hearing aid in the left ear based on limited speech discrimination. She expressed that she is not interested in amplification at this time. Patient is scheduled to see [facility] Theodosia Blender, [place]) following today's hearing test.    RECOMMENDATIONS: Audiological re-evaluation as per Theodosia Blender, PA.    Knute Neu, Au.D., CCC-A  Audiologist  [facility]iology

## 2021-05-28 NOTE — Progress Notes (Signed)
Robin Green is a 65 y.o. female who presents on 05/28/21 with concerns of hearing loss. She's familiar to our office for sudden left SNHL in '16. She has DM and was treated by Dr. Neil Crouch with 3 injections of dexamethasone with some improvement in hearing - though overall is non functional. MR head is without CPA/IAC mass. She feels her hearing may have slightly improved, however, the audiogram before today's appointment is stable to the audiogram in '18.     History:    has a past medical history of Anxiety, Diabetes, Diabetes mellitus, High blood pressure, and Hypertension.   has a past surgical history that includes joint replacement; Cholecystectomy, laparoscopic; knee replacement (2008/2017); Breast biopsy (2008); and pr hysteroscopy,w/endo bx (N/A, 07/31/2020).   reports that she has never smoked. She has never used smokeless tobacco. She reports that she does not drink alcohol and does not use drugs.  family history includes Breast cancer in her mother; Cataracts in her maternal aunt; Coronary artery disease in her maternal uncle; Dementia in her father; Diabetes in her maternal uncle and sister; Glaucoma in her maternal uncle; Hypertension in her sister; Prostate cancer in her father.    Allergies:   Nifedipine, Seafood, and Lisinopril     Medications:     Current Outpatient Medications   Medication Sig    ALPRAZolam (XANAX) 0.25 mg tablet TAKE 1 TABLET (0.25 MG TOTAL) BY MOUTH 2 TIMES DAILY AS NEEDED FOR ANXIETY    metFORMIN (GLUCOPHAGE) 500 mg tablet Take 1 tablet (500 mg total) by mouth 2 times daily (with meals)    ONETOUCH VERIO test strip TEST THREE TIMES DAILY    verapamil (CALAN-SR) 240 mg CR tablet TAKE 1 TABLET (240 MG TOTAL) BY MOUTH 2 TIMES DAILY SWALLOW WHOLE. DO NOT CRUSH, BREAK, OR CHEW.    UNABLE TO FIND Take 3 tablets by mouth 2 times daily  Med Name: Neocell Super (Collagen, Vit C and Biotin )    hydroCHLOROthiazide (HYDRODIURIL) 12.5 MG tablet TAKE 2 TABLETS (25 MG TOTAL) BY MOUTH  DAILY    lancets Brand Verio Flex; Use three  times per day as directed for blood glucose testing.    blood glucose monitor device By no specified route daily  Use to test blood sugar as directed    CINNAMON PO Take by mouth    Zinc Sulfate (ZINC-220 PO) Take by mouth    Potassium 99 MG TABS Take by mouth     TURMERIC PO Take 1 tablet by mouth daily    amoxicillin (AMOXIL) 500 MG capsule Prior to dental procedures    Non-System Medication BP cuff, large Dx: HTN    cholecalciferol (VITAMIN D) 1000 UNIT tablet Take 2,000 Units by mouth daily          BP 136/70    Pulse 56    Temp 36.7 C (98.1 F) (Temporal)    Ht 1.638 m (5' 4.49")    Wt 87.1 kg (192 lb)    BMI 32.46 kg/m    Pain Score:        General:   Normocephalic, well nourished, with appropriate affect in NAD.  A &O x 3.     Head and Face:  The head and face reveal normal facial symmetry without lesions or scars. The salivary glands are palpated and appear normal without tenderness or mass.    EYES:  Non icteric, normal conjunctiva.  EOMI.   Ears: RIGHT EAR:  Ear pinna is normal in appearance with no  scars, lesions or masses. Mastoid bone is non tender. The ear canal is clear.  The tympanic membrane is intact without inflammation or perforation. TM is mobile to pneumatic otoscopy without evidence of middle ear effusion.     LEFT EAR:  Ear pinna is normal in appearance with no scars, lesions or masses. Mastoid bone is non tender. The ear canal is clear. The tympanic membrane is intact without inflammation or perforation. TM is mobile to pneumatic otoscopy without evidence of middle ear effusion.        65 y.o. 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  I discussed a [employer] hearing aid system and she'd like to think about it.

## 2021-06-05 ENCOUNTER — Other Ambulatory Visit: Payer: Self-pay | Admitting: Primary Care

## 2021-06-05 MED ORDER — METFORMIN HCL 500 MG PO TABS *I*
500.0000 mg | ORAL_TABLET | Freq: Two times a day (BID) | ORAL | 1 refills | Status: DC
Start: 2021-06-05 — End: 2021-12-01

## 2021-06-05 NOTE — Telephone Encounter (Signed)
Last appointment: 05/14/2021  Next appointment: 07/18/2021

## 2021-07-12 ENCOUNTER — Other Ambulatory Visit: Payer: Self-pay | Admitting: Primary Care

## 2021-07-14 ENCOUNTER — Other Ambulatory Visit
Admission: RE | Admit: 2021-07-14 | Discharge: 2021-07-14 | Disposition: A | Discharge status: Home / Self Care | Payer: Medicare (Managed Care) | Source: Ambulatory Visit | Attending: Primary Care | Admitting: Primary Care

## 2021-07-14 DIAGNOSIS — E119 Type 2 diabetes mellitus without complications: Secondary | ICD-10-CM | POA: Insufficient documentation

## 2021-07-14 LAB — COMPREHENSIVE METABOLIC PANEL
ALT: 21 U/L (ref 0–35)
AST: 22 U/L (ref 0–35)
Albumin: 4.3 g/dL (ref 3.5–5.2)
Alk Phos: 53 U/L (ref 35–105)
Anion Gap: 12 (ref 7–16)
Bilirubin,Total: 0.6 mg/dL (ref 0.0–1.2)
CO2: 29 mmol/L — ABNORMAL HIGH (ref 20–28)
Calcium: 9.7 mg/dL (ref 8.6–10.2)
Chloride: 99 mmol/L (ref 96–108)
Creatinine: 0.9 mg/dL (ref 0.51–0.95)
Glucose: 85 mg/dL (ref 60–99)
Lab: 14 mg/dL (ref 6–20)
Potassium: 3.9 mmol/L (ref 3.3–5.1)
Sodium: 140 mmol/L (ref 133–145)
Total Protein: 7.3 g/dL (ref 6.3–7.7)
eGFR BY CREAT: 71 *

## 2021-07-14 LAB — LIPID PANEL
Chol/HDL Ratio: 2.4
Cholesterol: 221 mg/dL — AB
HDL: 91 mg/dL — ABNORMAL HIGH (ref 40–60)
LDL Calculated: 115 mg/dL
Non HDL Cholesterol: 130 mg/dL
Triglycerides: 73 mg/dL

## 2021-07-14 LAB — MICROALBUMIN, URINE, RANDOM
Creatinine,UR: 28 mg/dL (ref 20–300)
Microalbumin,UR: <1.2 mg/dL

## 2021-07-14 NOTE — Telephone Encounter (Signed)
Robin Green   Birth Date: 12-10-1955   Address: [address] [place], [place] [address]   Sex: Female   Rx Written Rx Dispensed Drug Quantity Days Supply Prescriber Name Prescriber Dea # Payment Method Dispenser   07/28/2020 08/13/2020 alprazolam 0.25 mg tablet  60 30 Hetty Ely MD [id number] [facility] [phone]     Last appointment: 05/14/2021  Next appointment: 07/18/2021

## 2021-07-15 LAB — HEMOGLOBIN A1C: Hemoglobin A1C: 6.2 % — ABNORMAL HIGH

## 2021-07-18 ENCOUNTER — Encounter: Payer: Self-pay | Admitting: Primary Care

## 2021-07-18 ENCOUNTER — Ambulatory Visit: Payer: Medicare (Managed Care) | Admitting: Primary Care

## 2021-07-18 VITALS — BP 142/86 | HR 68 | Ht 64.5 in | Wt 183.8 lb

## 2021-07-18 DIAGNOSIS — E119 Type 2 diabetes mellitus without complications: Secondary | ICD-10-CM

## 2021-07-18 DIAGNOSIS — E669 Obesity, unspecified: Secondary | ICD-10-CM

## 2021-07-18 DIAGNOSIS — I10 Essential (primary) hypertension: Secondary | ICD-10-CM

## 2021-07-18 DIAGNOSIS — E782 Mixed hyperlipidemia: Secondary | ICD-10-CM

## 2021-07-18 NOTE — Progress Notes (Signed)
[place]     Robin Green is a 65 y.o. female who presents for Follow-up    Patient seen for follow-up of the following chronic medical problems  Type 2 diabetes-occasionally misses metformin as it causes some GI distress  Hypertension no headache or vision problems.  Home blood pressures are better.  There was a home visit through her insurance company yesterday and she reports that the blood pressure was normal.  She will contact me with the actual number  Hyperlipidemia-no chest pain  Obesity-has lost 9 pounds since her last visit with me 2 months ago.    Interim history reviewed  Review of Systems   Constitutional: Positive for weight loss. Negative for chills and fever.   HENT: Negative for sore throat.    Respiratory: Negative for cough, shortness of breath and wheezing.    Cardiovascular: Negative for chest pain and palpitations.   Psychiatric/Behavioral: Negative for depression. The patient is not nervous/anxious.        MEDICATIONS     Current Outpatient Medications   Medication Sig    ALPRAZolam (XANAX) 0.25 mg tablet TAKE 1 TABLET (0.25 MG TOTAL) BY MOUTH 2 TIMES DAILY AS NEEDED FOR ANXIETY    metFORMIN (GLUCOPHAGE) 500 mg tablet Take 1 tablet (500 mg total) by mouth 2 times daily (with meals)    ONETOUCH VERIO test strip TEST THREE TIMES DAILY    verapamil (CALAN-SR) 240 mg CR tablet TAKE 1 TABLET (240 MG TOTAL) BY MOUTH 2 TIMES DAILY SWALLOW WHOLE. DO NOT CRUSH, BREAK, OR CHEW.    UNABLE TO FIND Take 3 tablets by mouth 2 times daily  Med Name: Neocell Super (Collagen, Vit C and Biotin )    hydroCHLOROthiazide (HYDRODIURIL) 12.5 MG tablet TAKE 2 TABLETS (25 MG TOTAL) BY MOUTH DAILY    lancets Brand Verio Flex; Use three  times per day as directed for blood glucose testing.    blood glucose monitor device By no specified route daily  Use to test blood sugar as directed    Zinc Sulfate (ZINC-220 PO) Take by mouth    Potassium 99 MG TABS Take by mouth      TURMERIC PO Take 1 tablet by mouth daily    amoxicillin (AMOXIL) 500 MG capsule Prior to dental procedures (Patient not taking: Reported on 05/28/2021)    Non-System Medication BP cuff, large Dx: HTN    cholecalciferol (VITAMIN D) 1000 UNIT tablet Take 2,000 Units by mouth daily          Patient's medications were reviewed and updated at today's visit and no changes were made    ALLERGIES     Nifedipine, Seafood, and Lisinopril      Patient's allergies were reviewed and updated at today's visit and no changes were made    PROBLEM LIST     Patient Active Problem List   Diagnosis Code    Sudden hearing loss H91.20    Obesity (BMI 30-39.9) E66.9    Type 2 diabetes mellitus E11.9    Carpal tunnel syndrome G56.00    HTN (hypertension) I10    Osteoarthritis, knee M17.9    Presence of right artificial knee joint Z96.651    Hyperlipidemia, mixed E78.2    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   Constitutional:       Comments: Pleasant lady in no distress, wearing a mask   HENT:      Head: Normocephalic and atraumatic.      Right Ear: External ear normal.      Left Ear: External ear normal.      Nose: Nose normal.   Eyes:      Conjunctiva/sclera: Conjunctivae normal.   Cardiovascular:      Rate and Rhythm: Normal rate and regular rhythm.      Heart sounds: Normal heart sounds.   Pulmonary:      Effort: Pulmonary effort is normal. No respiratory distress.      Breath sounds: Normal breath sounds.   Neurological:      Mental Status: She is  alert.   Psychiatric:         Mood and Affect: Mood normal.           RECENT LABS     Recent Results (from the past 336 hour(s))   Hemoglobin A1c    Collection Time: 07/14/21  2:59 PM   Result Value Ref Range    Hemoglobin A1C 6.2 (H) %   Microalbumin, Urine, Random    Collection Time: 07/14/21  2:59 PM   Result Value Ref Range    Creatinine,UR 28 20 - 300 mg/dL    Microalbumin,UR <1.20 mg/dL    Microalb/Creat Ratio see below 0.0 - 29.9 mg MA/g CR   Lipid Panel (Reflex to Direct  LDL if Triglycerides more than 400)    Collection Time: 07/14/21  2:59 PM   Result Value Ref Range    Cholesterol 221 (!) mg/dL    Triglycerides 73 mg/dL    HDL 91 (H) 40 - 60 mg/dL    LDL Calculated 115 mg/dL    Non HDL Cholesterol 130 mg/dL    Chol/HDL Ratio 2.4    Comprehensive metabolic panel    Collection Time: 07/14/21  2:59 PM   Result Value Ref Range    Sodium 140 133 - 145 mmol/L    Potassium 3.9 3.3 - 5.1 mmol/L    Chloride 99 96 - 108 mmol/L    CO2 29 (H) 20 - 28 mmol/L    Anion Gap 12 7 - 16    UN 14 6 - 20 mg/dL    Creatinine 0.90 0.51 - 0.95 mg/dL    eGFR BY CREAT 71 *    Glucose 85 60 - 99 mg/dL    Calcium 9.7 8.6 - 10.2 mg/dL    Total Protein 7.3 6.3 - 7.7 g/dL    Albumin 4.3 3.5 - 5.2 g/dL    Bilirubin,Total 0.6 0.0 - 1.2 mg/dL    AST 22 0 - 35 U/L    ALT 21 0 - 35 U/L    Alk Phos 53 35 - 105 U/L            ASSESSMENT/PLAN  1. Type 2 diabetes mellitus  Continue metformin.  A1c is in the prediabetic range.  Continue to follow low-carb diet and exercise 30 minutes most days of the week.  Continue to work on weight loss.  Labs reviewed with patient.  Reassess at next visit  - Hemoglobin A1c; Future    2. Essential hypertension  Blood pressure here is still not at goal even after repeating it.  However patient indicates that home blood pressures are normal and the reading taken by her insurance company recently was also within normal limits.  I hesitate to increase blood pressure medications if numbers are normal.  Patient  will monitor blood pressures at home 2-3 times per week and contact me if consistently elevated over 130/90.  Well aware of increased risk for heart attack and stroke with uncontrolled blood pressure    3. Hyperlipidemia, mixed  The 10-year ASCVD risk score (Arnett DK, et al., 2019) is: 25.1%    Values used to calculate the score:      Age: 34 years      Sex: Female      Is Non-Hispanic African American: Yes      Diabetic: Yes      Tobacco smoker: No      Systolic Blood Pressure: 916 mmHg      Is BP treated: Yes      HDL Cholesterol: 91 mg/dL      Total Cholesterol: 221 mg/dL   Very high risk for heart attack and stroke.  I reviewed starting on a statin again.  Patient is very hesitant about this.  She is adamant about following aggressive lifestyle measures.  States she has not been exercising much and has not been great with her diet.  Would like it rechecked in 4 months.  Understands risk for heart attack and stroke with uncontrolled high cholesterol.  Recommend predominantly plant-based low-cholesterol diet.  Recheck as ordered  - Lipid Panel (Reflex to Direct  LDL if Triglycerides more than 400); Future  - Comprehensive metabolic panel; Future    4. Obesity (BMI 30-39.9)  Congratulated on weight loss.  Continue to work on weight loss.    Follow up in about 4 months (around 11/15/2021) for Follow-up, diabetes.    --Patient instructed to call if symptoms are not improving or worsening  --Follow-up arranged    Signed: Earna Coder, MD on 07/18/2021 at 4:40 PM

## 2021-07-28 ENCOUNTER — Other Ambulatory Visit: Payer: Self-pay | Admitting: Primary Care

## 2021-07-28 DIAGNOSIS — E119 Type 2 diabetes mellitus without complications: Secondary | ICD-10-CM

## 2021-07-28 DIAGNOSIS — I10 Essential (primary) hypertension: Secondary | ICD-10-CM

## 2021-07-28 MED ORDER — BLOOD PRESSURE MONITOR DEVICE *A*
Freq: Every day | 0 refills | Status: DC
Start: 2021-07-28 — End: 2021-08-13

## 2021-07-28 MED ORDER — BLOOD GLUCOSE MONITOR SYSTEM KIT *A*
PACK | Freq: Every day | 0 refills | Status: DC
Start: 2021-07-28 — End: 2022-11-04

## 2021-07-28 NOTE — Telephone Encounter (Signed)
Faxed to [phone]

## 2021-07-28 NOTE — Telephone Encounter (Signed)
Please reprint script had blood glucose monitor set up not BP cuff

## 2021-07-28 NOTE — Telephone Encounter (Signed)
Script set up please print and will fax to number listed below

## 2021-07-28 NOTE — Telephone Encounter (Signed)
Patient needs a blood pressure  Monitor script sent to [facility] fax  [phone] they will mail to patient

## 2021-07-28 NOTE — Telephone Encounter (Signed)
printed

## 2021-08-07 ENCOUNTER — Telehealth: Payer: Self-pay | Admitting: Primary Care

## 2021-08-07 DIAGNOSIS — I10 Essential (primary) hypertension: Secondary | ICD-10-CM

## 2021-08-07 NOTE — Telephone Encounter (Signed)
Will need paperwork from the patient/insurance company for this.

## 2021-08-07 NOTE — Telephone Encounter (Signed)
Patient wants a pre authorization sent  for blood pressure monitor from [facility]   [address]  [phone]      Please Advise

## 2021-08-11 ENCOUNTER — Telehealth: Payer: Self-pay

## 2021-08-11 NOTE — Telephone Encounter (Signed)
myChart response

## 2021-08-13 ENCOUNTER — Telehealth: Payer: Self-pay | Admitting: Primary Care

## 2021-08-13 MED ORDER — BLOOD PRESSURE MONITOR DEVICE *A*
Freq: Every day | 0 refills | Status: DC
Start: 2021-08-13 — End: 2022-03-09

## 2021-08-13 NOTE — Telephone Encounter (Signed)
See other message script printed and faxed

## 2021-08-13 NOTE — Telephone Encounter (Signed)
Patient needs to talk to nurse regarding a blood pressure cuff insurance covers it she is having a hard time finding one [phone]

## 2021-08-13 NOTE — Telephone Encounter (Signed)
Scripted signed and faxed as requested

## 2021-08-13 NOTE — Telephone Encounter (Signed)
After printing script needs to be faxed too [phone]

## 2021-08-22 ENCOUNTER — Telehealth: Payer: Self-pay | Admitting: Primary Care

## 2021-08-22 NOTE — Telephone Encounter (Signed)
Patient is calling because [facility] said they never received the prescription for Blood pressure monitor they are asking to have it faxed to [phone]

## 2021-08-22 NOTE — Telephone Encounter (Signed)
Blood Pressure Monitor Prescription faxed per below, successful confirmation rec'd, patient notified

## 2021-08-27 ENCOUNTER — Other Ambulatory Visit: Payer: Self-pay | Admitting: Primary Care

## 2021-08-27 NOTE — Telephone Encounter (Signed)
Last appointment: 07/18/2021  Next appointment: 11/24/2021

## 2021-09-22 ENCOUNTER — Other Ambulatory Visit: Payer: Self-pay | Admitting: Primary Care

## 2021-09-22 MED ORDER — NYSTATIN 100000 UNIT/GM EX CREA *I*
TOPICAL_CREAM | Freq: Two times a day (BID) | CUTANEOUS | 3 refills | Status: DC
Start: 2021-09-22 — End: 2021-12-30

## 2021-09-22 NOTE — Telephone Encounter (Signed)
Pt is calling requesting a refill for Nystatin Ointment, 100,000 units/gmd please advise

## 2021-09-23 ENCOUNTER — Telehealth: Payer: Self-pay | Admitting: Primary Care

## 2021-09-23 ENCOUNTER — Other Ambulatory Visit: Payer: Self-pay | Admitting: Primary Care

## 2021-09-23 MED ORDER — ALPRAZOLAM 0.25 MG PO TABS *I*
0.2500 mg | ORAL_TABLET | Freq: Two times a day (BID) | ORAL | 0 refills | Status: DC | PRN
Start: 2021-09-23 — End: 2022-01-23

## 2021-09-23 NOTE — Telephone Encounter (Signed)
Pharmacy stated they faxed over perscription for alprazolam 1/21; stated haven't heard anything back      Please advise

## 2021-09-23 NOTE — Telephone Encounter (Signed)
escripted

## 2021-09-23 NOTE — Telephone Encounter (Signed)
Last appointment: 07/18/2021  Next appointment: 11/24/2021

## 2021-09-23 NOTE — Telephone Encounter (Signed)
Last visit 07/18/21  Next visit 11/24/21    Search Terms: Letta Neidich, 10-16-1955   Search Date: 09/23/2021 11:44:53 AM      There are no results for the search terms that you entered.

## 2021-11-24 ENCOUNTER — Ambulatory Visit: Payer: Medicare (Managed Care) | Admitting: Primary Care

## 2021-11-27 ENCOUNTER — Other Ambulatory Visit
Admission: RE | Admit: 2021-11-27 | Discharge: 2021-11-27 | Disposition: A | Discharge status: Home / Self Care | Payer: Medicare (Managed Care) | Source: Ambulatory Visit | Attending: Primary Care | Admitting: Primary Care

## 2021-11-27 DIAGNOSIS — E782 Mixed hyperlipidemia: Secondary | ICD-10-CM | POA: Insufficient documentation

## 2021-11-27 DIAGNOSIS — E119 Type 2 diabetes mellitus without complications: Secondary | ICD-10-CM | POA: Insufficient documentation

## 2021-11-27 LAB — COMPREHENSIVE METABOLIC PANEL
ALT: 24 U/L (ref 0–35)
AST: 32 U/L (ref 0–35)
Albumin: 4.5 g/dL (ref 3.5–5.2)
Alk Phos: 60 U/L (ref 35–105)
Anion Gap: 11 (ref 7–16)
Bilirubin,Total: 0.6 mg/dL (ref 0.0–1.2)
CO2: 29 mmol/L — ABNORMAL HIGH (ref 20–28)
Calcium: 10 mg/dL (ref 8.6–10.2)
Chloride: 98 mmol/L (ref 96–108)
Creatinine: 0.92 mg/dL (ref 0.51–0.95)
Glucose: 95 mg/dL (ref 60–99)
Lab: 14 mg/dL (ref 6–20)
Potassium: 4 mmol/L (ref 3.3–5.1)
Sodium: 138 mmol/L (ref 133–145)
Total Protein: 7.8 g/dL — ABNORMAL HIGH (ref 6.3–7.7)
eGFR BY CREAT: 69 *

## 2021-11-27 LAB — LIPID PANEL
Chol/HDL Ratio: 2.2
Cholesterol: 224 mg/dL — AB
HDL: 102 mg/dL — ABNORMAL HIGH (ref 40–60)
LDL Calculated: 112 mg/dL
Non HDL Cholesterol: 122 mg/dL
Triglycerides: 48 mg/dL

## 2021-11-28 ENCOUNTER — Encounter: Payer: Self-pay | Admitting: Primary Care

## 2021-11-28 ENCOUNTER — Ambulatory Visit: Payer: Medicare (Managed Care) | Admitting: Primary Care

## 2021-11-28 ENCOUNTER — Other Ambulatory Visit: Payer: Self-pay

## 2021-11-28 ENCOUNTER — Telehealth: Payer: Self-pay | Admitting: Primary Care

## 2021-11-28 VITALS — BP 132/68 | HR 57 | Ht 64.5 in | Wt 186.0 lb

## 2021-11-28 DIAGNOSIS — E669 Obesity, unspecified: Secondary | ICD-10-CM

## 2021-11-28 DIAGNOSIS — E119 Type 2 diabetes mellitus without complications: Secondary | ICD-10-CM

## 2021-11-28 DIAGNOSIS — I10 Essential (primary) hypertension: Secondary | ICD-10-CM

## 2021-11-28 DIAGNOSIS — E782 Mixed hyperlipidemia: Secondary | ICD-10-CM

## 2021-11-28 DIAGNOSIS — G4700 Insomnia, unspecified: Secondary | ICD-10-CM

## 2021-11-28 LAB — HEMOGLOBIN A1C: Hemoglobin A1C: 6.4 % — ABNORMAL HIGH

## 2021-11-28 MED ORDER — NYSTATIN 100000 UNIT/GM EX OINT *I*
1000000.0000 g | TOPICAL_OINTMENT | Freq: Two times a day (BID) | CUTANEOUS | 3 refills | Status: DC
Start: 2021-11-28 — End: 2023-08-02

## 2021-11-28 NOTE — Telephone Encounter (Signed)
Spoke to pt and she agreed to try the glipizide please send in I did review the message below

## 2021-11-28 NOTE — Progress Notes (Signed)
Robin Green     Robin Green is a 66 y.o. female who presents for Lab Results    Patient seen for follow-up of the following chronic medical problems  Type 2 diabetes-stop metformin a month ago as it was causing GI side effects and leg swelling.  Hypertension-no headache or vision problems  Hyperlipidemia-no chest pain  Insomnia-no new concerns  Obesity-no significant change in weight    Interim history reviewed  Review of Systems   Constitutional: Negative for chills and fever.   HENT: Negative for sore throat.    Respiratory: Negative for cough, shortness of breath and wheezing.    Cardiovascular: Negative for chest pain and palpitations.       MEDICATIONS     Current Outpatient Medications   Medication Sig    magnesium oxide (MAG-OX) 400 (241.3 mg) mg tablet Take 1 tablet (400 mg total) by mouth daily    Cinnamon 500 MG capsule Take 500 mg by mouth daily    UNABLE TO FIND Med Name:Osteo Biflex    hydroCHLOROthiazide (HYDRODIURIL) 12.5 MG tablet TAKE 2 TABLETS BY MOUTH DAILY.    blood pressure monitor By no specified route daily  Check Bps 2-3 times/week    blood glucose monitor device By no specified route daily  Use to test blood sugar as directed    ONETOUCH VERIO test strip TEST THREE TIMES DAILY    verapamil (CALAN-SR) 240 mg CR tablet TAKE 1 TABLET (240 MG TOTAL) BY MOUTH 2 TIMES DAILY SWALLOW WHOLE. DO NOT CRUSH, BREAK, OR CHEW.    lancets Brand Verio Flex; Use three  times per day as directed for blood glucose testing.    Zinc Sulfate (ZINC-220 PO) Take by mouth    Potassium 99 MG TABS Take by mouth     TURMERIC PO Take 1 tablet by mouth daily    cholecalciferol (VITAMIN D) 1000 UNIT tablet Take 2 tablets (2,000 units total) by mouth daily    nystatin (MYCOSTATIN) ointment Apply 1,000,000 g topically 2 times daily  to the following areas: under breast    ALPRAZolam (XANAX) 0.25 mg tablet Take 1 tablet (0.25 mg total) by mouth 2 times daily as needed  for Anxiety  Max daily dose: 0.5 mg    nystatin (MYCOSTATIN) 100000 UNIT/GM cream Apply topically 2 times daily  to the following areas:Apply 1gram topically 2 times daily as needed. Use topically twice daily on affected areas. (Patient not taking: Reported on 11/28/2021)    metFORMIN (GLUCOPHAGE) 500 mg tablet Take 1 tablet (500 mg total) by mouth 2 times daily (with meals) (Patient not taking: Reported on 11/28/2021)    UNABLE TO FIND Take 3 tablets by mouth 2 times daily  Med Name: Neocell Super (Collagen, Vit C and Biotin )    amoxicillin (AMOXIL) 500 MG capsule Prior to dental procedures (Patient not taking: Reported on 05/28/2021)    Non-System Medication BP cuff, large Dx: HTN       Patient's medications were reviewed and updated at today's visit and no changes were made    ALLERGIES     Nifedipine, Seafood, and Lisinopril      Patient's allergies were reviewed and updated at today's visit and no changes were made    PROBLEM LIST     Patient Active Problem List   Diagnosis Code    Sudden hearing loss H91.20    Obesity (BMI 30-39.9) E66.9    Type 2 diabetes mellitus  E11.9    Carpal tunnel syndrome G56.00    HTN (hypertension) I10    Osteoarthritis, knee M17.9    Presence of right artificial knee joint Z96.651    Hyperlipidemia, mixed E78.2    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   Constitutional:       Comments: Pleasant lady in no distress, wearing a mask   HENT:      Head: Normocephalic and  atraumatic.      Right Ear: External ear normal.      Left Ear: External ear normal.      Nose: Nose normal.   Eyes:      Conjunctiva/sclera: Conjunctivae normal.   Cardiovascular:      Rate and Rhythm: Normal rate and regular rhythm.      Heart sounds: Normal heart sounds.   Pulmonary:      Effort: Pulmonary effort is normal. No respiratory distress.      Breath sounds: Normal breath sounds.   Neurological:      Mental Status: She is alert.   Psychiatric:         Mood and Affect: Mood normal.           RECENT LABS     Recent Results (from the past 336 hour(s))   Comprehensive metabolic panel    Collection Time: 11/27/21  1:31 PM   Result Value Ref Range    Sodium 138 133 - 145 mmol/L    Potassium 4.0 3.3 - 5.1 mmol/L    Chloride 98 96 - 108 mmol/L    CO2 29 (H) 20 - 28 mmol/L    Anion Gap 11 7 - 16    UN 14 6 - 20 mg/dL    Creatinine 0.92 0.51 - 0.95 mg/dL    eGFR BY CREAT 69 *    Glucose 95 60 - 99 mg/dL    Calcium 10.0 8.6 - 10.2 mg/dL    Total Protein 7.8 (H) 6.3 - 7.7 g/dL    Albumin 4.5 3.5 - 5.2 g/dL    Bilirubin,Total 0.6 0.0 - 1.2 mg/dL    AST 32 0 - 35 U/L    ALT 24 0 - 35 U/L    Alk Phos 60 35 - 105 U/L   Lipid Panel (Reflex to Direct  LDL if Triglycerides more than 400)    Collection Time: 11/27/21  1:31 PM   Result Value Ref Range    Cholesterol 224 (!) mg/dL    Triglycerides 48 mg/dL    HDL 102 (H) 40 - 60 mg/dL    LDL Calculated 112 mg/dL    Non HDL Cholesterol 122 mg/dL    Chol/HDL Ratio 2.2  ASSESSMENT/PLAN     1. Type 2 diabetes mellitus  A1c is pending.  No longer on metformin.  We will decide on medication management once A1c is available.  She declines any injectables even once a week.  Continue to work on weight loss.  Continue to follow a low carbohydrate diet and exercise 30 minutes most days of the week    2. Hypertension, unspecified type  Continue verapamil and hydrochlorothiazide as prescribed.  Goal blood pressure 130/80 or less.  Labs reviewed with patient.  Reassess in 6  months    3. Hyperlipidemia, mixed  Framingham risk calculator score 9%.  Adamantly refuses statins.  Understands increased risk for heart attack and stroke.  Continue to work on weight loss and continue to follow low-cholesterol diet and exercise 30 minutes most days of the week.  Labs reviewed with patient.    4. Insomnia, unspecified type  Supportive care    5. Obesity (BMI 30-39.9)  Continue to work on weight loss    Notes that she has a GI that she sees and will call for an appointment for colonoscopy.  Does not need a referral    Follow up if symptoms worsen or fail to improve.    --Patient instructed to call if symptoms are not improving or worsening  --Follow-up arranged    Signed: Earna Coder, MD on 11/28/2021 at 9:29 AM

## 2021-11-28 NOTE — Telephone Encounter (Signed)
-----   Message from Hetty Ely, MD sent at 11/28/2021  4:19 PM EDT -----  A1c test results came back after the visit.  Currently at 6.4.  It is slightly worse than where she was 4 months ago at 6.2.  She stopped metformin a month ago.  I can do a prescription for glipizide to be taken once a day with food.  It is a pill that helps keep her blood sugars down.  It can cause low blood sugars so she can never take it without eating.  This is an alternative medication as she does not want to do metformin.  If agreeable send message back to me so I can send a prescription for the same

## 2021-11-29 MED ORDER — GLIPIZIDE 2.5 MG PO TB24 *I*
2.5000 mg | ORAL_TABLET | Freq: Every day | ORAL | 5 refills | Status: DC
Start: 2021-11-29 — End: 2021-12-26

## 2021-11-29 NOTE — Addendum Note (Signed)
Addended by: Hetty Ely on: 11/29/2021 09:02 PM     Modules accepted: Orders

## 2021-12-01 ENCOUNTER — Other Ambulatory Visit: Payer: Self-pay | Admitting: Primary Care

## 2021-12-01 NOTE — Telephone Encounter (Signed)
Spoke to pt and she is aware medication was sent in

## 2021-12-01 NOTE — Telephone Encounter (Signed)
Future appt 04/01/2022  Last seen 11/28/2021    Mm updated   Review and send

## 2021-12-16 ENCOUNTER — Encounter: Payer: Self-pay | Admitting: Gastroenterology

## 2021-12-17 ENCOUNTER — Encounter: Payer: Self-pay | Admitting: Optometry

## 2021-12-17 ENCOUNTER — Ambulatory Visit: Payer: Medicare (Managed Care) | Attending: Optometry | Admitting: Optometry

## 2021-12-17 ENCOUNTER — Other Ambulatory Visit: Payer: Self-pay

## 2021-12-17 DIAGNOSIS — H40003 Preglaucoma, unspecified, bilateral: Secondary | ICD-10-CM | POA: Insufficient documentation

## 2021-12-17 DIAGNOSIS — H5203 Hypermetropia, bilateral: Secondary | ICD-10-CM

## 2021-12-17 DIAGNOSIS — H524 Presbyopia: Secondary | ICD-10-CM | POA: Insufficient documentation

## 2021-12-17 DIAGNOSIS — H52203 Unspecified astigmatism, bilateral: Secondary | ICD-10-CM | POA: Insufficient documentation

## 2021-12-17 DIAGNOSIS — H25813 Combined forms of age-related cataract, bilateral: Secondary | ICD-10-CM | POA: Insufficient documentation

## 2021-12-17 DIAGNOSIS — E119 Type 2 diabetes mellitus without complications: Secondary | ICD-10-CM

## 2021-12-26 ENCOUNTER — Other Ambulatory Visit: Payer: Self-pay | Admitting: Primary Care

## 2021-12-26 NOTE — Telephone Encounter (Signed)
1 in mm Last ov 11/28/21  Next appt 04/01/22 Please ERX

## 2021-12-28 MED ORDER — GLIPIZIDE 2.5 MG PO TB24 *I*
2.5000 mg | ORAL_TABLET | Freq: Every day | ORAL | 1 refills | Status: DC
Start: 2021-12-28 — End: 2022-03-09

## 2021-12-29 NOTE — Progress Notes (Signed)
Outpatient Visit      Patient name: Robin Green  DOB: February 04, 1956       Age: 66 y.o.  MR#: [id number]    Encounter Date: 12/17/2021    Subjective:      Chief Complaint   Patient presents with   . Annual Exam   . Diabetes     HPI    Robin Green is a 67 y.o. female here for annual CEE with a history of DM   T2, glaucoma suspect OU, and refractive error. Pt reports eyes are doing   good. No itching, tearing. No headaches. Vision is stable. No flashes or   floaters.    BG: 96 last night  Lab Results       Component                Value               Date                       HA1C                     6.4 (H)             11/27/2021               Ocular Meds:   None        Last edited by Christena Deem, OD on 12/17/2021 12:13 PM.        has a current medication list which includes the following prescription(s): magnesium oxide, cinnamon, nystatin, UNABLE TO FIND, alprazolam, hydrochlorothiazide, blood pressure monitor, blood glucose monitor, onetouch verio, UNABLE TO FIND, lancets, zinc sulfate, potassium, turmeric, non-system medication, cholecalciferol, glipizide, metformin, nystatin, verapamil, and amoxicillin.     is allergic to nifedipine, seafood, and lisinopril.      Past Medical History:   Diagnosis Date   . Anxiety    . Diabetes    . Diabetes mellitus    . High blood pressure    . Hypertension       Past Surgical History:   Procedure Laterality Date   . BREAST BIOPSY  2008   . CHOLECYSTECTOMY, LAPAROSCOPIC     . JOINT REPLACEMENT      left TKR   . KNEE REPLACEMENT  2008/2017    x2   . PR HYSTEROSCOPY BX ENDOMETRIUM&/POLYPC W/WO D&C N/A 07/31/2020    Procedure: D & C HYSTEROSCOPY WITH MYOSURE, POLYPECTOMY;  Surgeon: Charyl Dancer, MD;  Location: [facility];  Service: OBGYN        Specialty Problems        Ophthalmology Problems    Type 2 diabetes mellitus            ROS    Positive for: Eyes  Negative for: Constitutional, Gastrointestinal, Neurological, Skin,   Genitourinary, Musculoskeletal,  HENT, Endocrine, Cardiovascular,   Respiratory, Psychiatric, Allergic/Imm, Heme/Lymph  Last edited by Wendee Beavers, COA on 12/10/2021  1:22 PM.         Objective:     Base Eye Exam     Visual Acuity (Snellen - Linear)       Right Left    Dist cc 20/30 +1 20/30 -2    Near cc J1+ OU     Correction: Glasses          Tonometry (Tonopen, 10:59 AM)       Right Left    Pressure 18  19          Pupils       Dark Light Shape React APD    Right 3 2.5 Round Brisk None    Left 3 2.5 Round Brisk None          Visual Fields       Left Right     Full Full          Extraocular Movement       Right Left     Full, Ortho Full, Ortho          Neuro/Psych     Oriented x3: Yes    Mood/Affect: Normal          Dilation     Both eyes: 1.0% [place] @ 11:01 AM            Slit Lamp and Fundus Exam     External Exam       Right Left    External Normal ocular adnexae, lacrimal gland & drainage, orbits Normal ocular adnexae, lacrimal gland & drainage, orbits          Slit Lamp Exam       Right Left    Lids/Lashes Normal structure & position Normal structure & position    Conjunctiva/Sclera Normal bulbar/palpebral, conjunctiva, sclera Normal bulbar/palpebral, conjunctiva, sclera    Cornea Normal epithelium, stroma, endothelium, tear film Normal epithelium, stroma, endothelium, tear film    Anterior Chamber Clear & deep Clear & deep    Iris Normal shape, size, morphology Normal shape, size, morphology    Lens 1+ Nuclear sclerosis, 1+ Cortical cataract 1+ Nuclear sclerosis, 1+ Cortical cataract    Anterior Vitreous Clear Clear          Fundus Exam       Right Left    Disc Normal size, appearance, nerve fiber layer Normal size, appearance, nerve fiber layer    C/D Ratio 0.3 0.3    Macula Normal, (-)CME Normal, (-)CME    Vessels rare crossing change, no DR Normal, (-)hemes, cws, exudates, irma, vb, NVE    Periphery Normal Normal            Refraction     Wearing Rx       Sphere Cylinder Axis Add    Right +2.00 -0.50 093 +2.50    Left +2.25 -1.00  055 +2.50    Age: dec 2022    Type: PAL          Manifest Refraction       Sphere Cylinder Axis Dist VA Add [place]    Right +2.00 -0.50 080 20/20 +2.50 J1+    Left +2.25 -1.00 075 20/20 +2.50 J1+          Final Rx       Sphere Cylinder Axis Dist VA Add [place]    Right +2.00 -0.50 080 20/20 +2.50 J1+    Left +2.25 -1.00 075 20/20 +2.50 J1+    Expiration Date: 12/18/2023              Final Rx       Sphere Cylinder Axis Dist VA Add [place]    Right +2.00 -0.50 080 20/20 +2.50 J1+    Left +2.25 -1.00 075 20/20 +2.50 J1+    Expiration Date: 12/18/2023                No annotated images are attached to the encounter.      Assessment/Plan:  1. Hyperopia with astigmatism and presbyopia, bilateral        2. Glaucoma suspect of both eyes  OCT, RNFL-OU    OCT, RNFL-OU      3. Diabetes mellitus type 2 without retinopathy              PLAN:  1. New glasses rx dispensed.     2. RNFL  OD borderline s; possible prog ave and s  OS normal, stable    IOP is stable and normal. Recommend repeat oct in 1 year.    3. Pt educated on findings and on the importance of maintaining good blood sugar control and follow up with PCP. Monitor with annual dilated exam.

## 2021-12-30 ENCOUNTER — Other Ambulatory Visit: Payer: Self-pay | Admitting: Primary Care

## 2022-01-22 ENCOUNTER — Telehealth: Payer: Self-pay | Admitting: Primary Care

## 2022-01-22 NOTE — Telephone Encounter (Signed)
Spoke to pt and she is aware of recall she will call back tomorrow requesting a new script and let us know where to send the script of alprazolam 0.25 mg

## 2022-01-23 ENCOUNTER — Other Ambulatory Visit: Payer: Self-pay | Admitting: Primary Care

## 2022-01-23 MED ORDER — ALPRAZOLAM 0.25 MG PO TABS *I*
0.2500 mg | ORAL_TABLET | Freq: Two times a day (BID) | ORAL | 0 refills | Status: DC | PRN
Start: 2022-01-23 — End: 2022-06-16

## 2022-01-23 NOTE — Telephone Encounter (Signed)
1 in mm PMP checked last disp 09/24/21   Last Ov 11/28/21 next appt 03/09/22 Please eRX     Patient Demographic Information [facility])    [facility] First Name Last Name Birth Date Gender Street Address [facility] Zip Code   [name] Oct 27, 1955 Female [address] [place] [address]        Search:  [facility] My Rx Current Rx Drug Type Rx Written Rx Dispensed Drug Quantity Days Supply Prescriber Name Prescriber [facility] # Payment Method Dispenser   A Y N B 09/23/2021 09/24/2021 alprazolam 0.25 mg tablet  60 30 Hetty Ely MD [id number] Medicare [facility] [phone]

## 2022-01-23 NOTE — Telephone Encounter (Addendum)
Last appointment: 11/28/2021  Next appointment: 03/09/2022    QTY of 60    Patient trying to replace recalled RX (Lot # recalled)

## 2022-01-23 NOTE — Telephone Encounter (Signed)
Another message has addressed this concern

## 2022-02-21 ENCOUNTER — Other Ambulatory Visit: Payer: Self-pay | Admitting: Primary Care

## 2022-02-23 NOTE — Telephone Encounter (Signed)
Last ov 11/28/21  Next ov  03/09/22

## 2022-03-06 ENCOUNTER — Other Ambulatory Visit
Admission: RE | Admit: 2022-03-06 | Discharge: 2022-03-06 | Disposition: A | Discharge status: Home / Self Care | Payer: Medicare (Managed Care) | Source: Ambulatory Visit | Attending: Primary Care | Admitting: Primary Care

## 2022-03-06 DIAGNOSIS — E119 Type 2 diabetes mellitus without complications: Secondary | ICD-10-CM | POA: Insufficient documentation

## 2022-03-06 DIAGNOSIS — I10 Essential (primary) hypertension: Secondary | ICD-10-CM | POA: Insufficient documentation

## 2022-03-06 LAB — COMPREHENSIVE METABOLIC PANEL
ALT: 22 U/L (ref 0–35)
AST: 22 U/L (ref 0–35)
Albumin: 4.2 g/dL (ref 3.5–5.2)
Alk Phos: 61 U/L (ref 35–105)
Anion Gap: 13 (ref 7–16)
Bilirubin,Total: 0.6 mg/dL (ref 0.0–1.2)
CO2: 26 mmol/L (ref 20–28)
Calcium: 9.7 mg/dL (ref 8.6–10.2)
Chloride: 99 mmol/L (ref 96–108)
Creatinine: 0.93 mg/dL (ref 0.51–0.95)
Glucose: 88 mg/dL (ref 60–99)
Lab: 17 mg/dL (ref 6–20)
Potassium: 4.4 mmol/L (ref 3.3–5.1)
Sodium: 138 mmol/L (ref 133–145)
Total Protein: 7.1 g/dL (ref 6.3–7.7)
eGFR BY CREAT: 68 *

## 2022-03-06 LAB — LIPID PANEL
Chol/HDL Ratio: 2.1
Cholesterol: 199 mg/dL
HDL: 94 mg/dL — ABNORMAL HIGH (ref 40–60)
LDL Calculated: 95 mg/dL
Non HDL Cholesterol: 105 mg/dL
Triglycerides: 52 mg/dL

## 2022-03-06 LAB — CBC
Hematocrit: 40 % (ref 34–45)
Hemoglobin: 13.1 g/dL (ref 11.2–15.7)
MCH: 29 pg (ref 26–32)
MCHC: 33 g/dL (ref 32–36)
MCV: 90 fL (ref 79–95)
Platelets: 253 10*3/uL (ref 160–370)
RBC: 4.5 MIL/uL (ref 3.9–5.2)
RDW: 14.7 % — ABNORMAL HIGH (ref 11.7–14.4)
WBC: 5.1 10*3/uL (ref 4.0–10.0)

## 2022-03-06 LAB — VITAMIN B12: Vitamin B12: 502 pg/mL (ref 232–1245)

## 2022-03-08 LAB — HEMOGLOBIN A1C: Hemoglobin A1C: 6.2 % — ABNORMAL HIGH

## 2022-03-09 ENCOUNTER — Ambulatory Visit: Payer: Medicare (Managed Care) | Admitting: Primary Care

## 2022-03-09 ENCOUNTER — Telehealth: Payer: Self-pay | Admitting: Primary Care

## 2022-03-09 ENCOUNTER — Other Ambulatory Visit: Payer: Self-pay

## 2022-03-09 ENCOUNTER — Encounter: Payer: Self-pay | Admitting: Primary Care

## 2022-03-09 VITALS — BP 128/68 | HR 72 | Ht 64.5 in | Wt 192.0 lb

## 2022-03-09 DIAGNOSIS — Z13820 Encounter for screening for osteoporosis: Secondary | ICD-10-CM

## 2022-03-09 DIAGNOSIS — E119 Type 2 diabetes mellitus without complications: Secondary | ICD-10-CM

## 2022-03-09 DIAGNOSIS — L299 Pruritus, unspecified: Secondary | ICD-10-CM

## 2022-03-09 DIAGNOSIS — I10 Essential (primary) hypertension: Secondary | ICD-10-CM

## 2022-03-09 DIAGNOSIS — Z0000 Encounter for general adult medical examination without abnormal findings: Secondary | ICD-10-CM

## 2022-03-09 DIAGNOSIS — N62 Hypertrophy of breast: Secondary | ICD-10-CM

## 2022-03-09 DIAGNOSIS — E782 Mixed hyperlipidemia: Secondary | ICD-10-CM

## 2022-03-09 DIAGNOSIS — E669 Obesity, unspecified: Secondary | ICD-10-CM

## 2022-03-09 MED ORDER — BLOOD PRESSURE MONITOR DEVICE *A*
Freq: Every day | 0 refills | Status: DC
Start: 2022-03-09 — End: 2022-04-08

## 2022-03-09 NOTE — Patient Instructions (Signed)
Thank you for completing your Annual Exam, Subsequent Annual Medicare Visit, and Lab Results   with [place] today.     The purpose of this visits was to:     Screen for disease   Assess risk of future medical problems   Help develop a healthy lifestyle   Update vaccines   Get to know your doctor in case of an illness    Patient Care Team:  Hetty Ely, MD as PCP - General (Primary Care)  Verita Schneiders, AUD as PCP - CCP-AUDIOLOGY     Medicare 5 Year Plan    The following items were identified as areas of concern during your screening today:  Diabetes - This is a risk factor for Heart Attack, Stroke, Kidney Problems, Eye Problems and a loss of feeling in your fingers and feet.   High Blood Pressure (Hypertension) - This is a risk factor for Heart Attack, Stroke, Kidney Problems and Eye Problems.   BMI greater than 25 - This is a risk for Heart Attack, Stroke, High Blood Pressure, Diabetes, High Cholesterol and other complications.       The Health Maintenance table below identifies screening tests and immunizations recommended by your health care team:  Health Maintenance: These screening recommendations are based on USPSTF, [employer], and [place] state guidelines   Topic Date Due   . Colon Cancer Screening  Never done   . Osteoporosis Screening  Never done   . Shingles Vaccine (2 of 3) 11/08/2012   . COVID-19 Vaccine (4 - Booster for Pfizer series) 01/21/2021   . Diabetic Foot Exam  03/17/2022   . HIV Screening  11/29/2022 (Originally 08/16/1968)   . Flu Shot (1) 05/01/2022   . Diabetic A1C Monitoring  06/06/2022   . Diabetic Kidney Disease  07/14/2022   . DEPRESSION SCREEN YEARLY  03/10/2023   . Fall Risk Screening  03/10/2023   . Breast Cancer Screening  03/29/2023   . Diabetic Eye Exam  12/18/2023   . DTaP/Tdap/Td Vaccines (2 - Td or Tdap) 09/25/2025   . Hepatitis C Screening  Completed   . Pneumococcal Vaccination  Completed     In addition, goals and orders placed to address these  recommendations are listed in the "Today's Visit" section.    We wish you the best of health and look forward to seeing you again next year for your Annual Medicare Wellness Visit.     If you have any health care concerns before then, please do not hesitate to contact us.

## 2022-03-09 NOTE — Telephone Encounter (Signed)
Patient would like to get a Blood Pressure monitor she is asking for a hard copy so she can take to any pharmacy or Medical supply company she asked Insurance and this is what they told her to do  Please advise

## 2022-03-09 NOTE — Progress Notes (Signed)
Bone density scan

## 2022-03-09 NOTE — Progress Notes (Signed)
Visit performed as:             Office Visit, met with patient in person    Today we reviewed and updated Robin Green's smoking status, activities of daily living, depression screen, fall risk, medications and allergies.   I have counseled the patient in the above areas.     Subjective:     Chief Complaint: Robin Green is a 66 y.o. female here for a/an Annual Exam, Subsequent Annual Medicare Visit, and Lab Results    In general, Robin Green rates their overall health as:  good      Patient Care Team:  Hetty Ely, MD as PCP - General (Primary Care)  Verita Schneiders, AUD as PCP - CCP-AUDIOLOGY     Current Outpatient Medications on File Prior to Visit   Medication Sig Dispense Refill   . hydroCHLOROthiazide (HYDRODIURIL) 12.5 MG tablet Take 1 tablet (12.5 mg total) by mouth daily 180 tablet 1   . magnesium oxide (MAG-OX) 400 (241.3 mg) mg tablet Take 1 tablet (400 mg total) by mouth daily     . Cinnamon 500 MG capsule Take 500 mg by mouth daily     . UNABLE TO FIND Med Name:Osteo Biflex     . blood pressure monitor By no specified route daily  Check Bps 2-3 times/week 1 each 0   . blood glucose monitor device By no specified route daily  Use to test blood sugar as directed 1 each 0   . verapamil (CALAN-SR) 240 mg CR tablet TAKE 1 TABLET (240 MG TOTAL) BY MOUTH 2 TIMES DAILY SWALLOW WHOLE. DO NOT CRUSH, BREAK, OR CHEW. 180 tablet 5   . Zinc Sulfate (ZINC-220 PO) Take by mouth     . Potassium 99 MG TABS Take by mouth      . TURMERIC PO Take 1 tablet by mouth daily     . Non-System Medication BP cuff, large Dx: HTN     . cholecalciferol (VITAMIN D) 1000 UNIT tablet Take 2 tablets (2,000 units total) by mouth daily     . ALPRAZolam (XANAX) 0.25 mg tablet Take 1 tablet (0.25 mg total) by mouth 2 times daily as needed for Anxiety  Max daily dose: 0.5 mg 60 tablet 0   . glipiZIDE (GLUCOTROL) 2.5 mg 24 hr tablet Take 1 tablet (2.5 mg total) by mouth daily (with breakfast)  Swallow whole. Do not crush, break, or  chew. (Patient not taking: Reported on 03/09/2022) 90 tablet 1   . metFORMIN (GLUCOPHAGE) 500 mg tablet TAKE 1 TABLET BY MOUTH TWICE A DAY WITH MEALS (Patient not taking: Reported on 12/17/2021) 180 tablet 1   . nystatin (MYCOSTATIN) ointment Apply 1,000,000 g topically 2 times daily  to the following areas: under breast 15 g 3   . ONETOUCH VERIO test strip TEST THREE TIMES DAILY 100 each 5   . UNABLE TO FIND Take 3 tablets by mouth 2 times daily  Med Name: Neocell Super (Collagen, Vit C and Biotin )     . lancets Brand Verio Flex; Use three  times per day as directed for blood glucose testing. 100 each 5   . amoxicillin (AMOXIL) 500 MG capsule Prior to dental procedures (Patient not taking: Reported on 05/28/2021)       No current facility-administered medications on file prior to visit.     Allergies   Allergen Reactions   . Nifedipine Swelling   . Seafood Itching   . Lisinopril Other (See Comments)  Dizziness and cough      Patient Active Problem List    Diagnosis Date Noted   . Thickened endometrium 07/31/2020   . Status post hysteroscopy, polypectomy, D&C 07/31/2020   . Acute pain of left shoulder 02/20/2020   . Lumbar spondylosis 09/29/2018   . Chronic left-sided low back pain with left-sided sciatica 12/16/2017   . Hyperlipidemia, mixed 03/17/2017   . Sudden hearing loss 02/25/2017   . Obesity (BMI 30-39.9) 02/25/2017   . Type 2 diabetes mellitus 02/25/2017   . Carpal tunnel syndrome 02/25/2017   . HTN (hypertension) 02/25/2017   . Osteoarthritis, knee 02/25/2017   . Presence of right artificial knee joint 12/25/2015     Past Medical History:   Diagnosis Date   . Anxiety    . Diabetes    . Diabetes mellitus    . High blood pressure    . Hypertension      Past Surgical History:   Procedure Laterality Date   . BREAST BIOPSY  2008   . CHOLECYSTECTOMY, LAPAROSCOPIC     . JOINT REPLACEMENT      left TKR   . KNEE REPLACEMENT  2008/2017    x2   . PR HYSTEROSCOPY BX ENDOMETRIUM&/POLYPC W/WO D&C N/A 07/31/2020     Procedure: D & C HYSTEROSCOPY WITH MYOSURE, POLYPECTOMY;  Surgeon: Charyl Dancer, MD;  Location: [facility];  Service: OBGYN     Family History   Problem Relation Age of Onset   . Breast cancer Mother    . Diabetes Sister    . Hypertension Sister    . Diabetes Maternal Uncle    . Coronary artery disease Maternal Uncle    . Glaucoma Maternal Uncle    . Prostate cancer Father    . Dementia Father    . Cataracts Maternal Aunt    . Amblyopia ([place]) Neg Hx    . Blindness Neg Hx    . Heart Disease Neg Hx    . Macular degeneration Neg Hx    . Retinal detachment Neg Hx    . Strabismus Neg Hx      Social History     Socioeconomic History   . Marital status: Widowed   Tobacco Use   . Smoking status: Never   . Smokeless tobacco: Never   Substance and Sexual Activity   . Alcohol use: No   . Drug use: No   . Sexual activity: Not Currently       Objective:     Vital Signs: BP 128/68   Pulse 72   Ht 1.638 m (5' 4.5")   Wt 87.1 kg (192 lb)   SpO2 98%   BMI 32.45 kg/m    BMI: Body mass index is 32.45 kg/m.    Vision Screening Results (Welcome visit only):  No results found.    Depression Screening Results:  Recent Review Flowsheet Data     PHQ Scores 03/09/2022 03/17/2021 01/31/2020 12/29/2019 12/29/2018 03/17/2017    PSQ2 Q1 - Interest/Pleasure - - N N N N    PSQ2 Q2 - Down, Depressed, Hopeless - - N N N N    PHQ Calculated Score 0 0 - - - -        Opioid Use/DAST- 10 Screening Results:   How many times in the past year have you used an illegal drug or used a prescription medication for nonmedical reasons?: 0 (03/09/2022  2:03 PM)    Activities of Daily Living/Functional Screening Results:  Is the person deaf or does he/she have serious difficulty hearing?: N  Is this person blind or does he/she have serious difficulty seeing even when wearing glasses?: N  *Vision Status: Visual aid   Does this person have serious difficulty walking or climbing stairs?: N  Does this person have difficulty dressing or bathing?:  N  *Shopping: Independent  *House Keeping: Independent  *Managing Own Medications: Independent  *Handling Finances: Independent  Difficulty doing errands due to a physicial, mental or emotional condition: No  Difficulty remembering or making decisions due to a physicial, mental or emotional condition: No      Fall Risk Screening Results:  Have you fallen in the last year?: No  Do you feel you are at risk for falling?: No      Assessment and Plan:     Cognitive Function:  Recall of recent and remote events appears:  Normal      Advanced Care Planning:  was discussed and the paperwork can be found in the scanned media section     The following health maintenance plan was reviewed with the patient:    Health Maintenance Topics with due status: Overdue       Topic Date Due    Colon Cancer Screening Other Never done    Osteoporosis Screening USPSTF Never done    IMM-ZOSTER 11/08/2012    COVID-19 Vaccine 01/21/2021     Health Maintenance Topics with due status: Due Soon       Topic Date Due    Diabetic Foot Exam ADA 03/17/2022     Health Maintenance Topics with due status: Postponed       Topic Postponed Until    HIV Screening USPSTF/[place] 11/29/2022 (Originally 08[birth year]16/1969)     Health Maintenance Topics with due status: Not Due       Topic Last Completion Date    IMM DTaP/Tdap/Td 09/26/2015    Breast Cancer Screening Other 03/28/2021    Diabetic Nephropathy Screening no ACE/ARB 07/14/2021    IMM-INFLUENZA 07/26/2021    Diabetic Eye Exam ADA 12/17/2021    Diabetic A1C Monitoring Other 03/06/2022    DEPRESSION SCREEN YEARLY 03/09/2022    Fall Risk Screening 03/09/2022     Health Maintenance Topics with due status: Completed       Topic Last Completion Date    Hepatitis C Screening USPSTF/[place] 06/10/2016    IMM Pneumo: 65+ Years 03/17/2021     This health maintenance schedule, identified risks, a list of orders placed today and patient goals have been provided to Robin Green in the after visit summary.     Plan for any concerns  identified during screening or risk assessments:  n/a

## 2022-03-09 NOTE — Progress Notes (Signed)
[facility]  Progress Note    Reason For Visit   Adult Comprehensive Physical Exam.    HPI   Today has concerns of: seen for medicare wellness and physical. Only concern is itching under both breasts when she takes her bra off.  Skin looks ok    Interim history reviewed    The Problem List, MHx, SHx, FHx, Immunizations, Medications, and Allergies were reviewed with the patient today during the appointment and updates were made today.    Allergies   Allergen Reactions   . Nifedipine Swelling   . Seafood Itching   . Glipizide Other (See Comments)     Memory concerns and numbness   . Lisinopril Other (See Comments)     Dizziness and cough      Current Outpatient Medications   Medication Sig Dispense Refill   . hydroCHLOROthiazide (HYDRODIURIL) 12.5 MG tablet Take 1 tablet (12.5 mg total) by mouth daily 180 tablet 1   . magnesium oxide (MAG-OX) 400 (241.3 mg) mg tablet Take 1 tablet (400 mg total) by mouth daily     . Cinnamon 500 MG capsule Take 500 mg by mouth daily     . UNABLE TO FIND Med Name:Osteo Biflex     . blood glucose monitor device By no specified route daily  Use to test blood sugar as directed 1 each 0   . verapamil (CALAN-SR) 240 mg CR tablet TAKE 1 TABLET (240 MG TOTAL) BY MOUTH 2 TIMES DAILY SWALLOW WHOLE. DO NOT CRUSH, BREAK, OR CHEW. 180 tablet 5   . Zinc Sulfate (ZINC-220 PO) Take by mouth     . Potassium 99 MG TABS Take by mouth      . TURMERIC PO Take 1 tablet by mouth daily     . Non-System Medication BP cuff, large Dx: HTN     . cholecalciferol (VITAMIN D) 1000 UNIT tablet Take 2 tablets (2,000 units total) by mouth daily     . blood pressure monitor By no specified route daily  Check Bps 2-3 times/week 1 each 0   . ALPRAZolam (XANAX) 0.25 mg tablet Take 1 tablet (0.25 mg total) by mouth 2 times daily as needed for Anxiety  Max daily dose: 0.5 mg 60 tablet 0   . metFORMIN (GLUCOPHAGE) 500 mg tablet TAKE 1 TABLET BY MOUTH TWICE A DAY WITH MEALS (Patient not taking: Reported on 12/17/2021)  180 tablet 1   . nystatin (MYCOSTATIN) ointment Apply 1,000,000 g topically 2 times daily  to the following areas: under breast 15 g 3   . ONETOUCH VERIO test strip TEST THREE TIMES DAILY 100 each 5   . UNABLE TO FIND Take 3 tablets by mouth 2 times daily  Med Name: Neocell Super (Collagen, Vit C and Biotin )     . lancets Brand Verio Flex; Use three  times per day as directed for blood glucose testing. 100 each 5   . amoxicillin (AMOXIL) 500 MG capsule Prior to dental procedures (Patient not taking: Reported on 05/28/2021)       No current facility-administered medications for this visit.     Patient Active Problem List    Diagnosis Date Noted   . Thickened endometrium 07/31/2020   . Status post hysteroscopy, polypectomy, D&C 07/31/2020   . Acute pain of left shoulder 02/20/2020   . Lumbar spondylosis 09/29/2018   . Chronic left-sided low back pain with left-sided sciatica 12/16/2017   . Hyperlipidemia, mixed 03/17/2017   . Sudden hearing  loss 02/25/2017   . Obesity (BMI 30-39.9) 02/25/2017   . Type 2 diabetes mellitus 02/25/2017   . Carpal tunnel syndrome 02/25/2017   . HTN (hypertension) 02/25/2017   . Osteoarthritis, knee 02/25/2017   . Presence of right artificial knee joint 12/25/2015     Past Medical History:   Diagnosis Date   . Anxiety    . Diabetes    . Diabetes mellitus    . High blood pressure    . Hypertension      Past Surgical History:   Procedure Laterality Date   . BREAST BIOPSY  2008   . CHOLECYSTECTOMY, LAPAROSCOPIC     . JOINT REPLACEMENT      left TKR   . KNEE REPLACEMENT  2008/2017    x2   . PR HYSTEROSCOPY BX ENDOMETRIUM&/POLYPC W/WO D&C N/A 07/31/2020    Procedure: D & C HYSTEROSCOPY WITH MYOSURE, POLYPECTOMY;  Surgeon: Charyl Dancer, MD;  Location: [facility];  Service: OBGYN     Family History   Problem Relation Age of Onset   . Breast cancer Mother    . Diabetes Sister    . Hypertension Sister    . Diabetes Maternal Uncle    . Coronary artery disease Maternal Uncle    . Glaucoma  Maternal Uncle    . Prostate cancer Father    . Dementia Father    . Cataracts Maternal Aunt    . Amblyopia (Lazy Eye) Neg Hx    . Blindness Neg Hx    . Heart Disease Neg Hx    . Macular degeneration Neg Hx    . Retinal detachment Neg Hx    . Strabismus Neg Hx      Social History     Socioeconomic History   . Marital status: Widowed   Tobacco Use   . Smoking status: Never   . Smokeless tobacco: Never   Substance and Sexual Activity   . Alcohol use: No   . Drug use: No   . Sexual activity: Not Currently        ROS   Review of Systems   Constitutional: Negative for chills, fever, malaise/fatigue and weight loss.   HENT: Negative for sore throat.    Eyes: Negative for blurred vision and double vision.   Cardiovascular: Negative for chest pain and palpitations.   Gastrointestinal: Negative for abdominal pain, nausea and vomiting.   Genitourinary: Negative for dysuria.   Skin: Positive for itching. Negative for rash.        Itching under both breasts and axilla   Neurological: Negative for dizziness.   Psychiatric/Behavioral: Negative for depression. The patient is not nervous/anxious.        Vitals  Blood pressure 128/68, pulse 72, height 1.638 m (5' 4.5"), weight 87.1 kg (192 lb), SpO2 98 %.    Physical Exam  Physical Exam  Vitals reviewed.   Constitutional:       General: She is not in acute distress.     Appearance: She is not diaphoretic.      Comments: Pleasant lady in no distress   HENT:      Head: Normocephalic and atraumatic.      Right Ear: Tympanic membrane, ear canal and external ear normal.      Left Ear: Tympanic membrane, ear canal and external ear normal.      Nose: Nose normal.      Mouth/Throat:      Pharynx: No oropharyngeal exudate.   Eyes:  General: No scleral icterus.        Right eye: No discharge.         Left eye: No discharge.      Pupils: Pupils are equal, round, and reactive to light.   Neck:      Thyroid: No thyromegaly.      Vascular: No JVD.   Cardiovascular:      Rate and Rhythm:  Normal rate and regular rhythm.      Heart sounds: Normal heart sounds. No murmur heard.     No gallop.   Pulmonary:      Effort: Pulmonary effort is normal. No respiratory distress.      Breath sounds: Normal breath sounds. No wheezing or rales.   Abdominal:      General: Bowel sounds are normal. There is no distension.      Palpations: Abdomen is soft. There is no mass.      Tenderness: There is no abdominal tenderness. There is no guarding or rebound.   Musculoskeletal:         General: Normal range of motion.      Cervical back: Normal range of motion and neck supple.   Lymphadenopathy:      Cervical: No cervical adenopathy.   Skin:     General: Skin is warm and dry.      Comments: Skin under both breasts appears to be normal.   Neurological:      Mental Status: She is alert and oriented to person, place, and time.      Cranial Nerves: No cranial nerve deficit.   Psychiatric:         Mood and Affect: Mood and affect normal.         Lab Results  Lab Results   Component Value Date    WBC 5.1 03/06/2022    HGB 13.1 03/06/2022    HCT 40 03/06/2022    MCV 90 03/06/2022    PLT 253 03/06/2022       Chemistry        Lab results: 03/06/22  1507   Sodium 138   Potassium 4.4   Chloride 99   CO2 26   UN 17   Creatinine 0.93        Lab results: 03/06/22  1507   Glucose 88   Calcium 9.7   Total Protein 7.1   Albumin 4.2   ALT 22   AST 22   Alk Phos 61   Bilirubin,Total 0.6          Lab Results   Component Value Date    CHOL 199 03/06/2022    HDL 94 (H) 03/06/2022    LDLC 95 03/06/2022    TRIG 52 03/06/2022    CHHDC 2.1 03/06/2022     Lab Results   Component Value Date    TSH 0.79 03/23/2019     Hemoglobin A1C   Date Value Ref Range Status   03/06/2022 6.2 (H) % Final     Comment:     Ref Range <=5.6  HbA1c values of 5.7-6.4% indicate an increased risk for developing  diabetes mellitus.  HbA1c values greater than or equal to 6.5% are diagnostic of  diabetes mellitus.  For diagnosis of diabetes in individuals without  unequivocal  hyperglycemia, results should be confirmed by repeat testing.       EKG shows normal sinus rhythm at 54 bpm.  Normal P axis.  No ST-T changes.  Unchanged from prior EKG  Assessment   This is a 66 y.o.female who comes in today for a physical exam    1. Preventative health care  Reviewed healthy lifestyle measures. Up to date on routine preventive care and immunizations. Reviewed current guidelines re : nutrition ,sleep ,hydration and exercise.   - EKG 12 lead    2. Screening for osteoporosis  Due DEXA scan as ordered  - DEXA Scan; Future    3. Type 2 diabetes mellitus  Has been off glipizide due to side effects.  Recommend restarting metformin.  Risks and benefits reviewed with the patient.  Continue to follow low-carb diet and exercise 30 minutes most days of the week.  Continue to work on weight loss  - Hemoglobin A1c; Future  - Comprehensive metabolic panel; Future  - Lipid Panel (Reflex to Direct  LDL if Triglycerides more than 400); Future    4. Hypertension  Follow a low salt diet and exercise 30 minutes most days of the week.  Continue verapamil at current dosage.  Goal blood pressure 130/90 or less.  Continue hydrochlorothiazide at current dosage.  Labs reviewed with patient.  Reassess at next visit    5. Hyperlipidemia, mixed  The 10-year ASCVD risk score (Arnett DK, et al., 2019) is: 19.9%    Values used to calculate the score:      Age: 21 years      Sex: Female      Is Non-Hispanic African American: Yes      Diabetic: Yes      Tobacco smoker: No      Systolic Blood Pressure: 128 mmHg      Is BP treated: Yes      HDL Cholesterol: 94 mg/dL      Total Cholesterol: 199 mg/dL   Follow low-cholesterol diet and exercise 30 minutes most days of the week.  Continue to work on weight loss.  Strongly urged that she consider statins.  Declines statins.  Aware of increased risk for MI and stroke.  Will work aggressively on diet and exercise.    6. Obesity (BMI 30-39.9)  Continue to work on weight  loss    7. Large breasts  Exam appears benign.  Referral done to dermatology for itching in the area under breast  - [facility]ERR[facility]    8. Itching  See above plan  - [facility] REFERR[facility]          Follow up: Follow up in about 4 months (around 07/10/2022) for Follow-up, diabetes.    Author: Hetty Ely, MD  Note created: 03/09/2022  at: 10:00 PM

## 2022-03-12 NOTE — Telephone Encounter (Addendum)
Spoke to pt and she was asking to have the script mailed to her Script was put in mail today

## 2022-03-12 NOTE — Telephone Encounter (Signed)
Pt stated was told by nurse to come in office to pick up bp monitoring device; writer seen nothing noted      Please advise

## 2022-03-17 NOTE — Progress Notes (Signed)
The patient was provided with the necessary information to schedule an appointment.

## 2022-03-18 ENCOUNTER — Ambulatory Visit: Payer: Medicare (Managed Care) | Admitting: Primary Care

## 2022-03-30 ENCOUNTER — Other Ambulatory Visit: Payer: Self-pay | Admitting: Gastroenterology

## 2022-04-01 ENCOUNTER — Ambulatory Visit: Payer: Medicare (Managed Care) | Admitting: Primary Care

## 2022-04-08 MED ORDER — BLOOD PRESSURE MONITOR DEVICE *A*
Freq: Every day | 0 refills | Status: DC
Start: 2022-04-08 — End: 2024-03-17

## 2022-04-08 NOTE — Telephone Encounter (Signed)
Pt stated she needs script for bp monitoring device faxed here        [phone]  [facility] ID: [id number]

## 2022-04-08 NOTE — Telephone Encounter (Signed)
Please reprint.

## 2022-04-08 NOTE — Addendum Note (Signed)
Addended by: Hetty Ely on: 04/08/2022 09:38 AM     Modules accepted: Orders

## 2022-04-08 NOTE — Addendum Note (Signed)
Addended by: Pablo Lawrence on: 04/08/2022 09:36 AM     Modules accepted: Orders

## 2022-04-15 NOTE — Telephone Encounter (Signed)
Patient called    Robin Green never received fax with Rx.    Re-faxed Rx Order  To [phone]    Added Acct [id number] to fax per patient request.    Patient will call them tomorrow to confirm receipt.

## 2022-04-17 ENCOUNTER — Other Ambulatory Visit: Payer: Self-pay | Admitting: Primary Care

## 2022-05-10 ENCOUNTER — Other Ambulatory Visit: Payer: Self-pay | Admitting: Primary Care

## 2022-05-11 NOTE — Telephone Encounter (Signed)
1 in mm      Last Ov: 03/09/22      Next OV: 07/17/22      Please ERX

## 2022-05-27 ENCOUNTER — Telehealth: Payer: Self-pay | Admitting: Primary Care

## 2022-05-27 MED ORDER — HYDROCHLOROTHIAZIDE 12.5 MG PO TABS *I*
25.0000 mg | ORAL_TABLET | Freq: Every day | ORAL | 1 refills | Status: DC
Start: 2022-05-27 — End: 2022-11-24

## 2022-05-27 NOTE — Telephone Encounter (Signed)
Spoke to pt and she understands

## 2022-05-27 NOTE — Telephone Encounter (Signed)
Pt stated she received meds; hydrochlorothiazide 12.5 mg tabs; instruction state 1 tab by mouth but normally takes 2 tabs a day; pharmacy requesting new script send over      Please advise

## 2022-06-09 ENCOUNTER — Ambulatory Visit: Payer: Medicare (Managed Care) | Admitting: Foot & Ankle Surgery

## 2022-06-09 ENCOUNTER — Other Ambulatory Visit: Payer: Self-pay

## 2022-06-09 DIAGNOSIS — E119 Type 2 diabetes mellitus without complications: Secondary | ICD-10-CM

## 2022-06-11 ENCOUNTER — Other Ambulatory Visit: Payer: Self-pay | Admitting: Primary Care

## 2022-06-11 NOTE — Telephone Encounter (Signed)
1 in mm last Ov 03/09/22 Next appt 07/17/22 Please erx

## 2022-06-13 ENCOUNTER — Other Ambulatory Visit: Payer: Self-pay | Admitting: Primary Care

## 2022-06-15 NOTE — Progress Notes (Signed)
SUBJECTIVE:    Chief Complaint   Patient presents with    Right Foot - Follow-up, Diabetes, Nail Problem    Left Foot - Follow-up, Diabetes, Nail Problem     DFC  DM X 6 years, BG today AM= didn't check  Wearing flats  A1C: 6.2 on 03/06/22     66 year old patient presents to clinic for diabetic foot check.  Admits to being diabetic for 6 years.  She does not check her blood glucose regularly.  Last A1c was 6.2.  Denies any symptoms to her feet today.  Overall she is feeling great. Pt denies any F/C/N/V/SOB/CP. Pt denies any redness, drainage, pus, streaking, or other SOI    PMH, PSH, Family, Social, Meds, see above  Allergy:   Allergies   Allergen Reactions    Nifedipine Swelling    Seafood Itching    Glipizide Other (See Comments)     Memory concerns and numbness    Lisinopril Other (See Comments)     Dizziness and cough         OBJECTIVE FINDINGS:     General: AOx3, NAD.  Gait is normal  Vascular: DP/PT 2/4 b/l, CFT <3 sec b/l  Neuro: Sensation intact with SW 5.07g monofilament b/l  Integument: Skin is warm, dry, supple.  Hair present to the level of the digits.  No discoloration of skin appreciated.  No preulcerative lesions appreciated.  No erythema, edema or signs of infection appreciated.  MSK: Motor fxn intact b/l. Muscle strength 5/5 b/l. Arch height is decreased.      HbA1c  Hemoglobin A1C   Date Value Ref Range Status   03/06/2022 6.2 (H) % Final     Comment:     Ref Range <=5.6  HbA1c values of 5.7-6.4% indicate an increased risk for developing  diabetes mellitus.  HbA1c values greater than or equal to 6.5% are diagnostic of  diabetes mellitus.  For diagnosis of diabetes in individuals without unequivocal  hyperglycemia, results should be confirmed by repeat testing.     11/27/2021 6.4 (H) % Final     Comment:     Ref Range <=5.6  HbA1c values of 5.7-6.4% indicate an increased risk for developing  diabetes mellitus.  HbA1c values greater than or equal to 6.5% are diagnostic of  diabetes mellitus.  For  diagnosis of diabetes in individuals without unequivocal  hyperglycemia, results should be confirmed by repeat testing.     07/14/2021 6.2 (H) % Final     Comment:     Ref Range <=5.6  HbA1c values of 5.7-6.4% indicate an increased risk for developing  diabetes mellitus.  HbA1c values greater than or equal to 6.5% are diagnostic of  diabetes mellitus.  For diagnosis of diabetes in individuals without unequivocal  hyperglycemia, results should be confirmed by repeat testing.     02/26/2021 6.8 (H) % Final     Comment:     Ref Range <=5.6  HbA1c values of 5.7-6.4% indicate an increased risk for developing  diabetes mellitus.  HbA1c values greater than or equal to 6.5% are diagnostic of  diabetes mellitus.  For diagnosis of diabetes in individuals without unequivocal  hyperglycemia, results should be confirmed by repeat testing.     08/14/2020 6.6 (H) % Final     Comment:     Ref Range <=5.6  HbA1c values of 5.7-6.4% indicate an increased risk for developing  diabetes mellitus.  HbA1c values greater than or equal to 6.5% are diagnostic of  diabetes  mellitus.  For diagnosis of diabetes in individuals without unequivocal  hyperglycemia, results should be confirmed by repeat testing.            DIAGNOSIS:    Encounter Diagnoses   Name Primary?    Type 2 diabetes mellitus without complication, without long-term current use of insulin Yes    Encounter for diabetic foot exam                             PLAN:     Pt seen and examined  Reviewed PCP note  Debrided all offending nails without incident.  Recommended pt use vicks vapor rub on nails daily  Educated patient on daily foot checks and good foot hygiene.  Educated patient on neuropathic changes and pt to follow up sooner should  notice any of these problems.  Discussed need to keep blood sugars within normal limits   I personally reviewed HbA1c with the patient in great detail with most recent H A1c of 6.2  Detailed explanation of potential foot and ankle problems  associated with diabetes  Patient has been afforded the opportunity to ask questions. All questions answered. Patient verbalized understanding of all issues and instructions  F/U yearly for DM check

## 2022-06-15 NOTE — Telephone Encounter (Signed)
1 in mm  [facility] First Name Last Name Birth Date Gender Street Address [facility] Zip Code   [name] 08/02/1956 Female [address] [place] [address]        Search:  [facility] My Rx Current Rx Drug Type Rx Written Rx Dispensed Drug Quantity Days Supply Prescriber Name Prescriber [facility] # Payment Method Dispenser   A Y N B 01/23/2022 01/24/2022 alprazolam 0.25 mg tablet 60 30 Hetty Ely MD [id number] Medicare [facility] [phone]   A Y N B 09/23/2021 09/24/2021 alprazolam 0.25 mg tablet 60 30 Hetty Ely MD [id number] Medicare [facility] [phone]     Showing 1 to 2 of 2 entries       Last Ov: 03/09/22      Next Ov: 07/17/22      Please ERX

## 2022-07-01 ENCOUNTER — Other Ambulatory Visit: Payer: Medicare (Managed Care)

## 2022-07-14 ENCOUNTER — Other Ambulatory Visit
Admission: RE | Admit: 2022-07-14 | Discharge: 2022-07-14 | Disposition: A | Discharge status: Home / Self Care | Payer: Medicare (Managed Care) | Source: Ambulatory Visit | Attending: Primary Care | Admitting: Primary Care

## 2022-07-14 DIAGNOSIS — E119 Type 2 diabetes mellitus without complications: Secondary | ICD-10-CM | POA: Insufficient documentation

## 2022-07-14 LAB — COMPREHENSIVE METABOLIC PANEL
ALT: 20 U/L (ref 0–35)
AST: 23 U/L (ref 0–35)
Albumin: 4.4 g/dL (ref 3.5–5.2)
Alk Phos: 59 U/L (ref 35–105)
Anion Gap: 11 (ref 7–16)
Bilirubin,Total: 0.5 mg/dL (ref 0.0–1.2)
CO2: 28 mmol/L (ref 20–28)
Calcium: 9.4 mg/dL (ref 8.6–10.2)
Chloride: 98 mmol/L (ref 96–108)
Creatinine: 0.94 mg/dL (ref 0.51–0.95)
Glucose: 99 mg/dL (ref 60–99)
Lab: 18 mg/dL (ref 6–20)
Potassium: 4.2 mmol/L (ref 3.3–5.1)
Sodium: 137 mmol/L (ref 133–145)
Total Protein: 7.4 g/dL (ref 6.3–7.7)
eGFR BY CREAT: 67 *

## 2022-07-14 LAB — LIPID PANEL
Chol/HDL Ratio: 2.2
Cholesterol: 209 mg/dL — AB
HDL: 97 mg/dL — ABNORMAL HIGH (ref 40–60)
LDL Calculated: 102 mg/dL
Non HDL Cholesterol: 112 mg/dL
Triglycerides: 52 mg/dL

## 2022-07-15 LAB — HEMOGLOBIN A1C: Hemoglobin A1C: 6.6 % — ABNORMAL HIGH

## 2022-07-17 ENCOUNTER — Other Ambulatory Visit: Payer: Self-pay

## 2022-07-17 ENCOUNTER — Encounter: Payer: Self-pay | Admitting: Primary Care

## 2022-07-17 ENCOUNTER — Ambulatory Visit: Payer: Medicare (Managed Care) | Attending: Primary Care | Admitting: Primary Care

## 2022-07-17 VITALS — BP 132/74 | HR 64 | Ht 64.5 in | Wt 189.0 lb

## 2022-07-17 DIAGNOSIS — E782 Mixed hyperlipidemia: Secondary | ICD-10-CM

## 2022-07-17 DIAGNOSIS — E119 Type 2 diabetes mellitus without complications: Secondary | ICD-10-CM | POA: Insufficient documentation

## 2022-07-17 DIAGNOSIS — Z23 Encounter for immunization: Secondary | ICD-10-CM

## 2022-07-17 DIAGNOSIS — E669 Obesity, unspecified: Secondary | ICD-10-CM

## 2022-07-17 DIAGNOSIS — I10 Essential (primary) hypertension: Secondary | ICD-10-CM | POA: Insufficient documentation

## 2022-07-17 LAB — MICROALBUMIN, URINE, RANDOM
Creatinine,UR: 31 mg/dL (ref 20–300)
Microalbumin,UR: <1.2 mg/dL

## 2022-07-17 LAB — URINALYSIS REFLEX TO CULTURE
Blood,UA: NEGATIVE
Glucose,UA: NEGATIVE
Ketones, UA: NEGATIVE
Leuk Esterase,UA: NEGATIVE
Nitrite,UA: NEGATIVE
Protein,UA: NEGATIVE
Specific Gravity,UA: 1.007 (ref 1.002–1.030)
pH,UA: 7 (ref 5.0–8.0)

## 2022-07-17 MED ORDER — MOMETASONE FUROATE 0.1 % EX SOLN *A*
Freq: Every day | CUTANEOUS | 0 refills | Status: AC
Start: 2022-07-17 — End: 2022-07-24

## 2022-07-17 NOTE — Progress Notes (Unsigned)
[facility]     HISTORY OF PRESENT ILLNESS     Robin Green is a 66 y.o. female who presents for Lab Results and Diabetes    Was seeing a chiropractor and then stopped due to thigh pain.   Itchy ears    ROS    MEDICATIONS     Current Outpatient Medications   Medication Sig    verapamil (CALAN-SR) 240 mg CR tablet TAKE 1 TABLET (240 MG TOTAL) BY MOUTH 2 TIMES DAILY SWALLOW WHOLE. DO NOT CRUSH, BREAK, OR CHEW.    hydroCHLOROthiazide (HYDRODIURIL) 12.5 MG tablet Take 2 tablets (25 mg total) by mouth daily    blood glucose (ONETOUCH VERIO) test strip Test using 1 strip 3 times daily    blood pressure monitor By no specified route daily  Check Bps 2-3 times/week    magnesium oxide (MAG-OX) 400 (241.3 mg) mg tablet Take 1 tablet (400 mg total) by mouth daily    Cinnamon 500 MG capsule Take 500 mg by mouth daily    UNABLE TO FIND Med Name:Osteo Biflex    blood glucose monitor device By no specified route daily  Use to test blood sugar as directed    UNABLE TO FIND Take 3 tablets by mouth 2 times daily  Med Name: Neocell Super (Collagen, Vit C and Biotin )    lancets Brand Verio Flex; Use three  times per day as directed for blood glucose testing.    Zinc Sulfate (ZINC-220 PO) Take by mouth    Potassium 99 MG TABS Take by mouth     TURMERIC PO Take 1 tablet by mouth daily    cholecalciferol (VITAMIN D) 1000 UNIT tablet Take 2 tablets (2,000 units total) by mouth daily    ALPRAZolam (XANAX) 0.25 mg tablet TAKE 1 TABLET (0.25 MG TOTAL) BY MOUTH 2 TIMES DAILY AS NEEDED FOR ANXIETY MAX DAILY DOSE: 0.5 MG    ALPRAZolam (NIRAVAM) 0.25 MG dissolvable tablet Take 1 tablet (0.25 mg total) by mouth nightly as needed    metFORMIN (GLUCOPHAGE) 500 mg tablet TAKE 1 TABLET BY MOUTH TWICE A DAY WITH MEALS    nystatin (MYCOSTATIN) ointment Apply 1,000,000 g topically 2 times daily  to the following areas: under breast    amoxicillin (AMOXIL) 500 MG capsule Prior to dental procedures (Patient not taking: Reported on 05/28/2021)     Non-System Medication BP cuff, large Dx: HTN       Patient's medications were reviewed and updated at today's visit and no changes were made    ALLERGIES     Nifedipine, Seafood, Glipizide, and Lisinopril      Patient's allergies were reviewed and updated at today's visit and no changes were made    PROBLEM LIST     Patient Active Problem List   Diagnosis Code    Sudden hearing loss H91.20    Obesity (BMI 30-39.9) E66.9    Type 2 diabetes mellitus E11.9    Carpal tunnel syndrome G56.00    HTN (hypertension) I10    Osteoarthritis, knee M17.9    Presence of right artificial knee joint Z96.651    Hyperlipidemia, mixed E78.2    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    General Appearance: No distress    Physical Exam      RECENT LABS     Recent Results (from the past 336 hour(s))   Lipid Panel (Reflex to Direct  LDL if Triglycerides more than 400)    Collection Time: 07/14/22  2:28 PM   Result Value Ref Range    Cholesterol 209 (!) mg/dL    Triglycerides 52 mg/dL    HDL 97 (H) 40 - 60 mg/dL    LDL Calculated 324 mg/dL    Non HDL Cholesterol 112 mg/dL    Chol/HDL Ratio 2.2    Comprehensive metabolic panel    Collection Time: 07/14/22  2:28 PM   Result Value Ref Range    Sodium 137 133 - 145 mmol/L    Potassium 4.2 3.3 - 5.1 mmol/L    Chloride 98 96 - 108 mmol/L    CO2 28 20 - 28 mmol/L    Anion Gap 11 7 - 16    UN 18 6 - 20 mg/dL    Creatinine 4.01 0.27 - 0.95 mg/dL    eGFR BY CREAT 67 *    Glucose 99 60 - 99 mg/dL    Calcium 9.4 8.6 - 25.3 mg/dL    Total Protein 7.4 6.3 - 7.7 g/dL    Albumin 4.4 3.5 - 5.2 g/dL    Bilirubin,Total 0.5 0.0 - 1.2 mg/dL    AST 23  0 - 35 U/L    ALT 20 0 - 35 U/L    Alk Phos 59 35 - 105 U/L   Hemoglobin A1c    Collection Time: 07/14/22  2:28 PM   Result Value Ref Range    Hemoglobin A1C 6.6 (H) %            ASSESSMENT/PLAN     The 10-year ASCVD risk score (Arnett DK, et al., 2019) is: 21.9%    Values used to calculate the score:      Age: 84 years      Sex: Female      Is Non-Hispanic African American: Yes      Diabetic: Yes      Tobacco smoker: No      Systolic Blood Pressure: 132 mmHg      Is BP treated: Yes      HDL Cholesterol: 97 mg/dL      Total Cholesterol: 209 mg/dL     No follow-ups on file.    --Patient instructed to call if symptoms are not improving or worsening  --Follow-up arranged    Signed: Hetty Ely, MD on 07/17/2022 at 1:27 PM

## 2022-08-21 ENCOUNTER — Ambulatory Visit
Admission: RE | Admit: 2022-08-21 | Discharge: 2022-08-21 | Disposition: A | Discharge status: Home / Self Care | Payer: Medicare (Managed Care) | Source: Ambulatory Visit

## 2022-08-21 DIAGNOSIS — Z780 Asymptomatic menopausal state: Secondary | ICD-10-CM

## 2022-08-21 DIAGNOSIS — Z13820 Encounter for screening for osteoporosis: Secondary | ICD-10-CM

## 2022-08-27 ENCOUNTER — Other Ambulatory Visit: Payer: Self-pay | Admitting: Primary Care

## 2022-08-27 ENCOUNTER — Encounter: Payer: Self-pay | Admitting: Gastroenterology

## 2022-08-27 MED ORDER — METFORMIN HCL 500 MG PO TABS *I*
500.0000 mg | ORAL_TABLET | Freq: Two times a day (BID) | ORAL | 1 refills | Status: DC
Start: 2022-08-27 — End: 2023-02-18

## 2022-08-27 NOTE — Telephone Encounter (Signed)
Refill request    Medication: Aprazolam & metformin    Confirm that the patient wants the medication sent to:   [id number] - [place], [place] - [address] [facility] [address]  [address] [place] [address]  Phone: [phone] Fax: [phone]        Additional message:     Relevant labs:    Last visit: 07/17/2022   Next visit: Future Encounters      11/30/2022  1:40 PM Sch    FOLLOW UP VISIT    PRA    Hetty Ely, MD                         Additional information to be completed by clinical staff:  If the request is for a controlled substance please paste the iStop results below:      **If the refill protocol indicates they are due for labs please order them and tell the patient to have them drawn**

## 2022-10-13 ENCOUNTER — Encounter: Payer: Self-pay | Admitting: Gastroenterology

## 2022-10-20 ENCOUNTER — Telehealth: Payer: Self-pay | Admitting: Primary Care

## 2022-10-20 NOTE — Telephone Encounter (Signed)
Patient was called and instructed if sx worsen or she feels needs to be evaluated soon then she should proceed to Urgent Care and message will be sent to Dr Rennis Harding to review tomorrow and update   Pt agreed and verbalizes understanding

## 2022-10-20 NOTE — Telephone Encounter (Signed)
Pt requesting OV w/ PCP. Pt aware pcp is out of office. Pt was advised if symptoms worsen, consider going to UC or ED.    Abdominal pain      Is the pain severe?no  Do you have a temperature greater than 100.4? no  Is the pain in the right lower part of your belly?  no      Additional information:  When did the symptoms start? A week, on and off (not every day)  Location of pain? In the center around navel, pain travels down to lower thigh  Diarrhea? no  Constipation?  no  Vomiting?  no If YES, able to keep water down?    Have you tried anything to treat the pain? Advil, not often

## 2022-10-21 ENCOUNTER — Encounter: Payer: Self-pay | Admitting: Primary Care

## 2022-10-21 ENCOUNTER — Other Ambulatory Visit: Payer: Self-pay

## 2022-10-21 ENCOUNTER — Ambulatory Visit: Payer: Medicare (Managed Care) | Admitting: Primary Care

## 2022-10-21 VITALS — BP 146/76 | HR 59 | Ht 64.5 in | Wt 189.0 lb

## 2022-10-21 DIAGNOSIS — M542 Cervicalgia: Secondary | ICD-10-CM

## 2022-10-21 DIAGNOSIS — R109 Unspecified abdominal pain: Secondary | ICD-10-CM

## 2022-10-21 NOTE — Progress Notes (Unsigned)
[facility]     HISTORY OF PRESENT ILLNESS     Robin Green is a 67 y.o. female who presents for Abdominal Pain and Neck Pain    Thinks that she started with abdominal pain may be 2 weeks ago. Sharp pain in the middle of her abdomen. Not every day. 7/10. No aggravating or relieving factors. Has had it twice. Happened on Monday and one day last week.   Rt thigh was hurting and has been to PT. Referred by orthopedics.   Neck is hurting. About a week. Could not hardly move it. Achy. 8/10. No numbness or tingling in arms. Can hurt with movement.     Interim history reviewed    Review of Systems   Gastrointestinal:  Positive for abdominal pain. Negative for constipation, diarrhea, nausea and vomiting.   Genitourinary:  Negative for dysuria.        No vaginal discharge       MEDICATIONS     Current Outpatient Medications   Medication Sig    metFORMIN (GLUCOPHAGE) 500 mg tablet Take 1 tablet (500 mg total) by mouth 2 times daily (with meals)    verapamil (CALAN-SR) 240 mg CR tablet TAKE 1 TABLET (240 MG TOTAL) BY MOUTH 2 TIMES DAILY SWALLOW WHOLE. DO NOT CRUSH, BREAK, OR CHEW.    hydroCHLOROthiazide (HYDRODIURIL) 12.5 MG tablet Take 2 tablets (25 mg total) by mouth daily    blood glucose (ONETOUCH VERIO) test strip Test using 1 strip 3 times daily    blood pressure monitor By no specified route daily  Check Bps 2-3 times/week    Cinnamon 500 MG capsule Take 500 mg by mouth daily    blood glucose monitor device By no specified route daily  Use to test blood sugar as directed    lancets Brand Verio Flex; Use three  times per day as directed for blood glucose testing.    Zinc Sulfate (ZINC-220 PO) Take by mouth    Potassium 99 MG TABS Take by mouth     TURMERIC PO Take 1 tablet by mouth daily    Non-System Medication BP cuff, large Dx: HTN    cholecalciferol (VITAMIN D) 1000 UNIT tablet Take 2 tablets (2,000 units total) by mouth daily    ALPRAZolam (XANAX) 0.25 mg tablet TAKE 1 TABLET (0.25 MG TOTAL) BY MOUTH 2  TIMES DAILY AS NEEDED FOR ANXIETY MAX DAILY DOSE: 0.5 MG    magnesium oxide (MAG-OX) 400 (241.3 mg) mg tablet Take 1 tablet (400 mg total) by mouth daily (Patient not taking: Reported on 10/21/2022)    nystatin (MYCOSTATIN) ointment Apply 1,000,000 g topically 2 times daily  to the following areas: under breast    UNABLE TO FIND Med Name:Osteo Biflex    UNABLE TO FIND Take 3 tablets by mouth 2 times daily  Med Name: Neocell Super (Collagen, Vit C and Biotin ) (Patient not taking: Reported on 10/21/2022)    amoxicillin (AMOXIL) 500 MG capsule Prior to dental procedures (Patient not taking: Reported on 05/28/2021)       Patient's medications were reviewed and updated at today's visit and no changes were made    ALLERGIES     Nifedipine, Seafood, Glipizide, and Lisinopril      Patient's allergies were reviewed and updated at today's visit and no changes were made    PROBLEM LIST     Patient Active Problem List   Diagnosis Code    Sudden hearing loss H91.20    Obesity (BMI  30-39.9) E66.9    Type 2 diabetes mellitus E11.9    Carpal tunnel syndrome G56.00    HTN (hypertension) I10    Osteoarthritis, knee M17.9    Presence of right artificial knee joint Z96.651    Hyperlipidemia, mixed E78.2    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   Constitutional:       Comments: Pleasant lady in no distress   HENT:      Head: Normocephalic.      Right Ear: External ear normal.      Left Ear: External ear normal.      Nose: Nose normal.   Eyes:      Conjunctiva/sclera: Conjunctivae  normal.   Pulmonary:      Effort: Pulmonary effort is normal. No respiratory distress.   Abdominal:      Palpations: Abdomen is soft.      Tenderness: There is generalized abdominal tenderness. There is no guarding or rebound.   Musculoskeletal:      Cervical back: Decreased range of motion.   Neurological:      Mental Status: She is alert.   Psychiatric:         Mood and Affect: Mood normal.           RECENT LABS   No results found for this or any previous visit (from the past 336 hour(s)).         ASSESSMENT/PLAN     1. Abdominal pain, unspecified abdominal location  Nonspecific. Check ultrasound to evaluate GB per patient request. Further management based on test results  - US abdomen limited single quad; Future    2. Neck pain  Appears muscular. Recommend heat to the area. Start PT as discussed.  - AMB REFERRAL TO [facility]YSICAL / [facility]     Follow up if symptoms worsen or fail to improve.    --Patient instructed to call if symptoms are not improving or worsening  --Follow-up arranged    Signed: Hetty Ely, MD on 10/22/2022 at 10:07 PM

## 2022-10-21 NOTE — Progress Notes (Unsigned)
No urine frequency  stomach pain feels like a knot in her stomach below belly button comes and goes

## 2022-10-21 NOTE — Telephone Encounter (Signed)
Scheduled as requested.

## 2022-10-22 ENCOUNTER — Ambulatory Visit
Admission: RE | Admit: 2022-10-22 | Discharge: 2022-10-22 | Disposition: A | Discharge status: Home / Self Care | Payer: Medicare (Managed Care) | Source: Ambulatory Visit | Attending: Primary Care | Admitting: Primary Care

## 2022-10-22 DIAGNOSIS — R109 Unspecified abdominal pain: Secondary | ICD-10-CM | POA: Insufficient documentation

## 2022-10-22 DIAGNOSIS — Z9049 Acquired absence of other specified parts of digestive tract: Secondary | ICD-10-CM | POA: Insufficient documentation

## 2022-10-22 DIAGNOSIS — K429 Umbilical hernia without obstruction or gangrene: Secondary | ICD-10-CM

## 2022-10-23 ENCOUNTER — Telehealth: Payer: Self-pay | Admitting: Primary Care

## 2022-10-23 NOTE — Telephone Encounter (Addendum)
Pt is feeling pretty good right now she is wondering if anything needs to be done about this or what she should be watching for.   Please advise      ----- Message from Earna Coder, MD sent at 10/22/2022 10:09 PM EST -----  Ultrasound is normal except for fat containing umblical hernia. Please call patient to see how she is doing

## 2022-10-26 NOTE — Telephone Encounter (Signed)
Spoke to pt and right ow doing okay she will take it day by day and it she needs anything will call

## 2022-11-04 ENCOUNTER — Other Ambulatory Visit: Payer: Self-pay | Admitting: Primary Care

## 2022-11-04 DIAGNOSIS — E119 Type 2 diabetes mellitus without complications: Secondary | ICD-10-CM

## 2022-11-04 MED ORDER — BLOOD GLUCOSE MONITOR SYSTEM KIT *A*
PACK | Freq: Every day | 0 refills | Status: AC
Start: 2022-11-04 — End: ?

## 2022-11-04 NOTE — Telephone Encounter (Signed)
Refill request    Medication:   Requested Prescriptions     Pending Prescriptions Disp Refills    blood glucose monitor device 1 each 0     Sig: By no specified route daily  Use to test blood sugar as directed         Confirm that the patient wants the medication sent to:   [id number] [place] [place]  [address] Phone: [phone]: [phone]       Additional message:     Relevant labs:  Diabetes medication (HgBA1c, BMP, last urine microalbumin)      Lab results: 07/14/22  1428   Sodium 137   Potassium 4.2   Chloride 98   CO2 28   UN 18   Creatinine 0.94   Glucose 99   Calcium 9.4      Microalbumin,UR (mg/dL)   Date Value   07/17/2022 <1.20      Hemoglobin A1C (%)   Date Value   07/14/2022 6.6 (H)     Last visit: 10/21/2022   Next visit: Future Encounters      11/30/2022  1:40 PM Sch    FOLLOW UP VISIT    PRA    EEarna Coder MD                         Additional information to be completed by clinical staff:  If the request is for a controlled substance please paste the iStop results below:      **If the refill protocol indicates they are due for labs please order them and tell the patient to have them drawn**

## 2022-11-04 NOTE — Telephone Encounter (Signed)
Refill request    Medication: blood glucose monitor device     Confirm that the patient wants the medication sent to:   [id number] [place], [place] - [address]  [address]  [place] [address] Phone: [phone]: [phone]       Additional message:     Relevant labs:    Last visit: 10/21/2022   Next visit: Future Encounters      11/30/2022  1:40 PM Sch    FOLLOW UP VISIT    PRA    EEarna Coder MD                         Additional information to be completed by clinical staff:  If the request is for a controlled substance please paste the iStop results below:      **If the refill protocol indicates they are due for labs please order them and tell the patient to have them drawn**

## 2022-11-09 ENCOUNTER — Other Ambulatory Visit: Payer: Self-pay | Admitting: Primary Care

## 2022-11-09 DIAGNOSIS — E119 Type 2 diabetes mellitus without complications: Secondary | ICD-10-CM

## 2022-11-09 NOTE — Progress Notes (Addendum)
Department of Physical Medicine & Rehabilitation  Physical Therapy Initial Assessment    History/Subjective    Diagnosis: Neck pain with R shoulder pain    Referring practitioner: Hetty Ely, MD    Onset date of symptoms:  10/21/22 script date    Mechanism of injury: Lifting heavy furniture     Work Status:  Retired    History: Robin Green is a 67 y/o female presenting to physical therapy with chief complaint of neck pain and R shoulder pain. Patient reports about 2 months ago she was lifting heavy furniture in her house and began feeling the pain in her neck and shoulder a few days later. Pain has been staying the same since but depends on how much she moves. Pain is aggravated with activities such as cleaning, sleeping, lifting. Patient reports that she feels like she"overdoes it" and then will be sore for days after. Pain is relieved with rest, heat, and vicks vapor rub.     Sports/Activities/Functional limitations:  Lifting, cleaning, sleeping     Symptoms Worsen With: Activity, lifting    Symptoms Better With: Rest    Past Medical History:   Diagnosis Date    Anxiety     Diabetes     Diabetes mellitus     High blood pressure     Hypertension      Past Surgical History:   Procedure Laterality Date    BREAST BIOPSY  2008    CHOLECYSTECTOMY, LAPAROSCOPIC      JOINT REPLACEMENT      left TKR    KNEE REPLACEMENT  2008/2017    x2    PR HYSTEROSCOPY BX ENDOMETRIUM&/POLYPC W/WO D&C N/A 07/31/2020    Procedure: D & C HYSTEROSCOPY WITH MYOSURE, POLYPECTOMY;  Surgeon: Charyl Dancer, MD;  Location: [facility];  Service: OBGYN       Comorbidities affecting treatment/recovery:  Anxiety  Diabetes  Personal factors affecting treatment/recovery:  none identified    Clinical presentation:  evolving    Patient complexity:    moderate level as indicated by above stability of condition, personal factors, environmental factors and comorbidities in addition to their impairments found on physical exam.    Pain:   Today:  6/10   Best: 6/10   Worst: 9/10      Location: Neck into R shoulder    Objective    Observation: Pleasant and cooperative, in NAD      ROM Left Shoulder Right Shoulder   Flexion [facility]   External Rotation [facility]   Internal Rotation [facility] WFL   Other (abduction) [facility] WFL         STRENGTH Left Shoulder Right Shoulder   Flexion 4+/5 4+/5   External Rotation 5/5 5/5   Internal Rotation 5/5 5/5   Abduction 4+/5 4+/5         SPECIAL TEST Left Shoulder Right Shoulder     Empty Can/Supraspinatus - -   Impingment - -   Infraspinatus       O'Brian's     Speed's - -   Scapular Winging          *DASH score = To be assessed  *Normative data for the DASH Outcome Measure have been collected in a large general population survey conducted by the [employer]OS. The results have been published by [facility] (2002) which reports that the general population would score 10.1 on the DASH with a standard deviation of 14.68.  A change in DASH score exceeding 15 points is  the most accurate change score for discriminating between improved and unimproved patients [employer] Pax [phone]; 2001b).  Minimal detectable change was calculated for the DASH across six different study populations and ranged from 8 to 17 DASH points (with a mean of 13).        ROM Cervical Spine %   Flexion 100   Extension 75   Right Sidebending 75*   Left Sidebending 50*   Right Rotation 75*   Left Rotation 75   *painful    Assessment:  Robin Green is a 67 y/o female presenting to physical therapy with chief complaint of neck pain. Patient demonstrates decreased strength, decreased ROM and decreased activity tolerance with increased pain and increased weakness. These impairments impact the patients ability to complete housework such as cleaning, sleep and lift anything. Patient will benefit from skilled therapy interventions to address impairments and goals below.     Rehab potential/prognosis: good  Patient's understanding: good    Patient's goal:  Stop pain    Plan  Plan of Care: Appropriate  for PT    PT interventions: AROM/PROM/Therapeutic exercise, Balance activites, Closed chain activites, Cold, Flexibility, Gait training/Functional activities, General conditioning, Heat, Home exercise program instruction, Joint mobilizations, Manual therapy, Neuromuscular Re-education, Patient/Family Education, Postural training/body Curator education, Proprioceptive training, Strengthening    PT frequency:  Once a week    PT duration: 12 weeks      Short term goals (4 weeks):  1. Initiate HEP  2. Independent with home thermal agents  3. Patient will report 25% decrease in pain while completing 10 minutes of housework  4. Assess DASH    Long term goals (12 weeks):  1. Independent with final HEP  2. Patient will report 50% decrease in pain while completing 20 minutes of housework  3. Patient will improve cervical range of motion to 100% without significant pain  4. DASH will improve by 10 points to indicate significant improvement in function      Thank you for the referral.  If you have any questions and/or concerns, please feel free to contact me at [phone].      Constance Haw PT, DPT    Department of Physical Medicine & Rehabilitation    PLAN OF CARE    Physician:  Hetty Ely, MD    I have reviewed your documentation and agree with the documented goals and Plan of Care      12/23/2022

## 2022-11-10 ENCOUNTER — Other Ambulatory Visit: Payer: Self-pay

## 2022-11-10 ENCOUNTER — Ambulatory Visit: Payer: Medicare (Managed Care) | Attending: Primary Care

## 2022-11-10 DIAGNOSIS — M542 Cervicalgia: Secondary | ICD-10-CM | POA: Insufficient documentation

## 2022-11-10 DIAGNOSIS — M25511 Pain in right shoulder: Secondary | ICD-10-CM | POA: Insufficient documentation

## 2022-11-10 NOTE — Progress Notes (Signed)
Physical Therapy Exercise Flowsheet:  *Please refer to Physical Therapy Daily Flowsheet for further details of this session.*       11/10/22 1700   Cervical Exercises   Chin Tucks Comment x10 in supine reviewed for HEP, moderate cueing for form   Cervical Rotation Comment x10 B with towel in supine reviewed for HEP   Levator Scapular Stretch Comment 5x15sec hold reviewed for HEP   Upper Trapezius Stretch, Sitting Comment 5x15sec hold reviewed for HEP   Total time 15 min       Ainsley Spinner PT, DPT

## 2022-11-10 NOTE — Progress Notes (Signed)
Physical Therapy Daily Flowsheet:  *Please see Physical Therapy Exercise Flowsheet for details regarding exercises completed this session.*       11/10/22 1700   Overview   Diagnosis Neck pain, R shoulder pain   Insurance Medicare   Script Date 10/21/22   Visit # 1   Additional Comments Intial eval see attached   Patient Education   Patient Education Yes   Educated in disease process Yes   Educated in erogonomic principles Yes   Educated in home exercise program Yes   Additional Patient Education HEP: supine chin tucks, supine cervical rotation with towel, upper trap stretch, levator scapulae stretch   Time Calculation   PT Timed Codes 15   PT Untimed Codes 20   PT Total Treatment 35       Robin Green PT, DPT

## 2022-11-10 NOTE — Telephone Encounter (Signed)
Last OV 10/21/22  Next OV 11/30/22

## 2022-11-21 ENCOUNTER — Other Ambulatory Visit: Payer: Self-pay | Admitting: Primary Care

## 2022-11-21 DIAGNOSIS — I10 Essential (primary) hypertension: Secondary | ICD-10-CM

## 2022-11-24 NOTE — Telephone Encounter (Signed)
Last OV 10/21/22  Next OV 11/30/22

## 2022-11-27 ENCOUNTER — Other Ambulatory Visit
Admission: RE | Admit: 2022-11-27 | Discharge: 2022-11-27 | Disposition: A | Discharge status: Home / Self Care | Payer: Medicare (Managed Care) | Source: Ambulatory Visit | Attending: Primary Care | Admitting: Primary Care

## 2022-11-27 DIAGNOSIS — I10 Essential (primary) hypertension: Secondary | ICD-10-CM | POA: Insufficient documentation

## 2022-11-27 DIAGNOSIS — E119 Type 2 diabetes mellitus without complications: Secondary | ICD-10-CM | POA: Insufficient documentation

## 2022-11-27 LAB — COMPREHENSIVE METABOLIC PANEL
ALT: 19 U/L (ref 0–35)
AST: 26 U/L (ref 0–35)
Albumin: 4.2 g/dL (ref 3.5–5.2)
Alk Phos: 54 U/L (ref 35–105)
Anion Gap: 11 (ref 7–16)
Bilirubin,Total: 0.6 mg/dL (ref 0.0–1.2)
CO2: 28 mmol/L (ref 20–28)
Calcium: 9.9 mg/dL (ref 8.6–10.2)
Chloride: 101 mmol/L (ref 96–108)
Creatinine: 1.01 mg/dL — ABNORMAL HIGH (ref 0.51–0.95)
Glucose: 84 mg/dL (ref 60–99)
Lab: 16 mg/dL (ref 6–20)
Potassium: 4.3 mmol/L (ref 3.3–5.1)
Sodium: 140 mmol/L (ref 133–145)
Total Protein: 7.2 g/dL (ref 6.3–7.7)
eGFR BY CREAT: 61 *

## 2022-11-27 LAB — LIPID PANEL
Chol/HDL Ratio: 2
Cholesterol: 192 mg/dL
HDL: 94 mg/dL — ABNORMAL HIGH (ref 40–60)
LDL Calculated: 88 mg/dL
Non HDL Cholesterol: 98 mg/dL
Triglycerides: 50 mg/dL

## 2022-11-30 ENCOUNTER — Encounter: Payer: Self-pay | Admitting: Gastroenterology

## 2022-11-30 ENCOUNTER — Other Ambulatory Visit: Payer: Self-pay

## 2022-11-30 ENCOUNTER — Encounter: Payer: Self-pay | Admitting: Primary Care

## 2022-11-30 ENCOUNTER — Ambulatory Visit: Payer: Medicare (Managed Care) | Admitting: Primary Care

## 2022-11-30 VITALS — BP 128/70 | HR 56

## 2022-11-30 DIAGNOSIS — E119 Type 2 diabetes mellitus without complications: Secondary | ICD-10-CM

## 2022-11-30 DIAGNOSIS — E782 Mixed hyperlipidemia: Secondary | ICD-10-CM

## 2022-11-30 DIAGNOSIS — K429 Umbilical hernia without obstruction or gangrene: Secondary | ICD-10-CM

## 2022-11-30 DIAGNOSIS — E669 Obesity, unspecified: Secondary | ICD-10-CM

## 2022-11-30 DIAGNOSIS — I10 Essential (primary) hypertension: Secondary | ICD-10-CM

## 2022-11-30 NOTE — Progress Notes (Signed)
[facility]     HISTORY OF PRESENT ILLNESS     Robin Green is a 67 y.o. female who presents for Hypertension, Diabetes, and Lab Results  Patient seen for follow-up of the following chronic medical problems  Type 2 diabetes-could do better with her diet  Hypertension-no headache or vision problems  Hyperlipidemia-no chest pain  Obesity-working on weight loss  Umbilical hernia-bothers her on and off.  Interim history reviewed      Review of Systems   Constitutional:  Negative for chills and fever.   Respiratory:  Negative for cough, shortness of breath and wheezing.    Cardiovascular:  Negative for chest pain and palpitations.       MEDICATIONS     Current Outpatient Medications   Medication Sig    hydroCHLOROthiazide (HYDRODIURIL) 12.5 MG tablet TAKE 2 TABLETS BY MOUTH DAILY.    ONETOUCH VERIO test strip TEST USING 1 STRIP 3 TIMES DAILY    blood glucose monitor device By no specified route daily  Use to test blood sugar as directed    metFORMIN (GLUCOPHAGE) 500 mg tablet Take 1 tablet (500 mg total) by mouth 2 times daily (with meals)    verapamil (CALAN-SR) 240 mg CR tablet TAKE 1 TABLET (240 MG TOTAL) BY MOUTH 2 TIMES DAILY SWALLOW WHOLE. DO NOT CRUSH, BREAK, OR CHEW.    blood pressure monitor By no specified route daily  Check Bps 2-3 times/week    Cinnamon 500 MG capsule Take 500 mg by mouth daily    UNABLE TO FIND Med Name:Osteo Biflex    lancets Brand Verio Flex; Use three  times per day as directed for blood glucose testing.    Zinc Sulfate (ZINC-220 PO) Take by mouth    Potassium 99 MG TABS Take by mouth     TURMERIC PO Take 1 tablet by mouth daily    Non-System Medication BP cuff, large Dx: HTN    cholecalciferol (VITAMIN D) 1000 UNIT tablet Take 2 tablets (2,000 units total) by mouth daily    ALPRAZolam (XANAX) 0.25 mg tablet TAKE 1 TABLET (0.25 MG TOTAL) BY MOUTH 2 TIMES DAILY AS NEEDED FOR ANXIETY MAX DAILY DOSE: 0.5 MG    magnesium oxide (MAG-OX) 400 (241.3 mg) mg tablet Take 1 tablet (400 mg  total) by mouth daily (Patient not taking: Reported on 10/21/2022)    nystatin (MYCOSTATIN) ointment Apply 1,000,000 g topically 2 times daily  to the following areas: under breast    UNABLE TO FIND Take 3 tablets by mouth 2 times daily  Med Name: Neocell Super (Collagen, Vit C and Biotin ) (Patient not taking: Reported on 10/21/2022)    amoxicillin (AMOXIL) 500 MG capsule Prior to dental procedures (Patient not taking: Reported on 05/28/2021)       Patient's medications were reviewed and updated at today's visit and no changes were made    ALLERGIES     Nifedipine, Seafood, Glipizide, and Lisinopril      Patient's allergies were reviewed and updated at today's visit and no changes were made    PROBLEM LIST     Patient Active Problem List   Diagnosis Code    Sudden hearing loss H91.20    Obesity (BMI 30-39.9) E66.9    Type 2 diabetes mellitus E11.9    Carpal tunnel syndrome G56.00    HTN (hypertension) I10    Osteoarthritis, knee M17.9    Presence of right artificial knee joint Z96.651    Hyperlipidemia, mixed E78.2  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   HENT:      Head: Normocephalic and atraumatic.      Right Ear: External ear normal.      Left Ear: External ear normal.      Nose: Nose normal.   Cardiovascular:      Rate and Rhythm: Regular rhythm.      Heart sounds: Normal heart sounds. No murmur heard.  Pulmonary:      Effort: Pulmonary effort is normal. No respiratory distress.      Breath sounds: Normal breath sounds.   Neurological:       Mental Status: She is alert.   Psychiatric:         Mood and Affect: Affect normal.           RECENT LABS     Recent Results (from the past 336 hour(s))   Lipid Panel (Reflex to Direct  LDL if Triglycerides more than 400)    Collection Time: 11/27/22  1:14 PM   Result Value Ref Range    Cholesterol 192 mg/dL    Triglycerides 50 mg/dL    HDL 94 (H) 40 - 60 mg/dL    LDL Calculated 88 mg/dL    Non HDL Cholesterol 98 mg/dL    Chol/HDL Ratio 2.0    Comprehensive metabolic panel    Collection Time: 11/27/22  1:14 PM   Result Value Ref Range    Sodium 140 133 - 145 mmol/L    Potassium 4.3 3.3 - 5.1 mmol/L    Chloride 101 96 - 108 mmol/L    CO2 28 20 - 28 mmol/L    Anion Gap 11 7 - 16    UN 16 6 - 20 mg/dL    Creatinine 1.91 (H) 0.51 - 0.95 mg/dL    eGFR BY CREAT 61 *    Glucose 84 60 - 99 mg/dL    Calcium 9.9 8.6 - 47.8 mg/dL    Total Protein 7.2 6.3 - 7.7 g/dL    Albumin 4.2 3.5 - 5.2 g/dL    Bilirubin,Total 0.6 0.0 - 1.2 mg/dL    AST 26 0 - 35 U/L    ALT 19 0 - 35 U/L    Alk Phos 54 35 - 105 U/L   Hemoglobin A1c    Collection Time: 11/27/22  1:14 PM   Result Value Ref Range    Hemoglobin A1C 6.6 (H) %            ASSESSMENT/PLAN     1. Type 2 diabetes mellitus  Follow a low carb diet. Continue to work on wt loss. Continue metformin at current dose. Recheck labs in 4 months  - Hemoglobin A1c; Future  - Comprehensive metabolic panel; Future  - Lipid Panel (Reflex to Direct  LDL if Triglycerides  more than 400); Future    2. Essential hypertension  BP is well controlled. Continue HCTZ and verapamil at current dose.Goal Bp of 130/80 or less. Continue to work on wt loss      3. Hyperlipidemia, mixed  The 10-year ASCVD risk score (Arnett DK, et al., 2019) is: 20.9%    Values used to calculate the score:      Age: 21 years      Sex: Female      Is Non-Hispanic African American: Yes      Diabetic: Yes      Tobacco smoker: No      Systolic Blood Pressure: 128 mmHg      Is BP treated: Yes      HDL Cholesterol: 94 mg/dL      Total  Cholesterol: 192 mg/dL     Strongly urged to consider statins. Reviewed risks for MI and stroke. Patient declines for now. Understands her risks. Wishes to recheck in 4 months. Is going to eat more of a vegan diet - (daughter is doing a vegan diet and has been talking to her about this)    4. Obesity (BMI 30-39.9)  Continue to work on wt loss    5. Umbilical hernia  See referral to surgery for evaluation of umbilical hernia  - AMB REFERRAL TO [facility]   Follow up if symptoms worsen or fail to improve.    --Patient instructed to call if symptoms are not improving or worsening  --Follow-up arranged    Signed: Hetty Ely, MD on 11/30/2022 at 9:16 PM

## 2022-12-22 ENCOUNTER — Encounter: Payer: Self-pay | Admitting: Otolaryngology

## 2022-12-22 ENCOUNTER — Ambulatory Visit: Payer: Medicare (Managed Care) | Admitting: Otolaryngology

## 2022-12-22 ENCOUNTER — Other Ambulatory Visit: Payer: Self-pay

## 2022-12-22 ENCOUNTER — Ambulatory Visit: Payer: Medicare (Managed Care) | Attending: Audiology | Admitting: Audiology

## 2022-12-22 VITALS — BP 174/85 | HR 63 | Temp 98.6°F | Ht 64.0 in | Wt 189.0 lb

## 2022-12-22 DIAGNOSIS — H9122 Sudden idiopathic hearing loss, left ear: Secondary | ICD-10-CM | POA: Insufficient documentation

## 2022-12-22 DIAGNOSIS — H9042 Sensorineural hearing loss, unilateral, left ear, with unrestricted hearing on the contralateral side: Secondary | ICD-10-CM | POA: Insufficient documentation

## 2022-12-22 NOTE — Progress Notes (Signed)
No chief complaint on file.      History:    Robin Green is a 67 y.o. female presenting for a recheck of her left ear.  She was last seen in 2017 for sudden sensorineural hearing loss left side.  MRI dns was negative for retrocochlear lesion.    Meds:  Current Outpatient Medications   Medication    hydroCHLOROthiazide (HYDRODIURIL) 12.5 MG tablet    ONETOUCH VERIO test strip    blood glucose monitor device    ALPRAZolam (XANAX) 0.25 mg tablet    metFORMIN (GLUCOPHAGE) 500 mg tablet    verapamil (CALAN-SR) 240 mg CR tablet    blood pressure monitor    magnesium oxide (MAG-OX) 400 (241.3 mg) mg tablet    Cinnamon 500 MG capsule    nystatin (MYCOSTATIN) ointment    UNABLE TO FIND    UNABLE TO FIND    lancets    Zinc Sulfate (ZINC-220 PO)    Potassium 99 MG TABS    TURMERIC PO    amoxicillin (AMOXIL) 500 MG capsule    Non-System Medication    cholecalciferol (VITAMIN D) 1000 UNIT tablet     No current facility-administered medications for this visit.       Allergies:  Allergies   Allergen Reactions    Nifedipine Swelling    Seafood Itching    Glipizide Other (See Comments)     Memory concerns and numbness    Lisinopril Other (See Comments)     Dizziness and cough        SH:  nonsmoler    Examination:  There were no vitals filed for this visit.    Physical Exam:  healthy appearing 67 y.o. female  EARS: Ear canal patent.  Tympanic membranes intact and mobile.  Landmarks visible bilaterally    Labs / Imaging / Studies      05/28/2021     4:00 PM 06/02/2017     4:00 PM 07/31/2016    11:00 AM   Conventional Audiogram   R 250 Hz 15 15 10    R 500 Hz 10 10 10    R 1000 Hz 20 15 10    R 2000 Hz 20 10 5    R 4000 Hz 25 15 15    R 8000 Hz 5 10 5    R Speech 92 % 100 % 100 %   L 250 Hz 15 35 15   L 500 Hz 40 45 40   L 1000 Hz 80 80 70   L 2000 Hz 95 90 85   L 4000 Hz 95 85 75   L 8000 Hz 85 80 70   L Speech 16 % 20 % 12 %      Today, left ear is 10-20db worst at 4-8k    MRI 2016   1. No nodular lesions identified in either internal  auditory canal or   cerebellopontine angle cisterns. No evidence of acoustic neuroma on   the basis of this MRI.       2. No evidence of intracranial mass lesion, intracranial or   obstructive hydrocephalus.       3. No significant signal abnormalities are identified in the brain   parenchyma or brainstem.           Assessment     ICD-10-CM ICD-9-CM   1. 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 female  with left sensorineural hearing loss.  Hearing has declined 10-20 db in the high tones.  She is a candidate for a CROS hearing aid. I reviewed this technology with her and she was referred to a licensed hearing aid dealer

## 2022-12-22 NOTE — Progress Notes (Signed)
AUDIOLOGIC EVALUATION     Audiology  Part of [facility]   [address], [address]  [place],  [place] [address]  Phone: [phone], Fax: [phone]     Outpatient Visit  Patient: Robin Green   MR Number: [id number]   Date of Birth: 11/04/1955   Date of Visit: 12/22/2022      PURE-TONE TEST RESULTS  Type of Testing: conventional      Test Reliability: good  Transducer: headphone, bone  Booth: 1  ANSI S3.21.2004 (R2009)     Air Conduction Testing (dB HL and kHz)     LEFT EAR RIGHT EAR     0.125 0.25 0.50  0.75 1.0 1.5 2.0 3.0 4.0 6.0 8.0  0.125 0.25 0.50 0.75 1.0 1.5 2.0 3.0 4.0 6.0 8.0     15 35 60 75 100 100 105 110 NR (105) NR (105)      15 10 15 15 20 15 15 20 20 5                                                                     Effective Masking Level           40 45 70 70 75 80 80 80                            Bone Conduction Testing (dB HL and kHz)  Bone conduction was masked when appropriate      0.25 0.50  0.75 1.0 1.5 2.0 3.0 4.0     0.25 0.50 0.75 1.0 1.5 2.0 3.0 4.0        40   NR   NR   NR     15 0   15   20   25                                                                       Effective Masking Level      60   85   85   85                            SPEECH AUDIOMETRY     SAT SRT Score dB HL EML  Test  SAT SRT Score dB HL EML  Test       20 % 90 60 W-22 CD      100 % 55 25 W-22 CD   EML   EML            EML   EML               Please refer to scanned [facility] [id number] MR for additional information     Notes  Threshold in dB HL  Frequency in kiloHertz (kHz) Legend   dB=decibels  HL=Hearing Level  NR=No Response  VT=Vibro-Tactile EML=Effective Masking Level  SAT=Speech Awareness Threshold  SRT=Speech Reception  Threshold  MLV=Monitored Live Voice  CD=Compact Disk     HISTORY: Maryemma Siew was referred by Dr. Rennis Harding (PCP) for an audiological re-evaluation to determine whether patient is a candidate for medical, surgical or rehabilitative services.Patient has  a history of sudden hearing loss in the left ear on 07/13/2015 which was treated by [facility]. The previous audiogram dated 05/28/2021 shows indicate a mild to profound sensorineural hearing loss in the left ear and normal/borderline normal hearing in the right ear. At today's appointment, she states that she feels her left ear hearing has improved in the past two years. She denies tinnitus, dizziness, vertigo, aural fullness/pressure, ear drainage, and otalgia.     FINDINGS: Pure-tone test results indicate a mild sloping to profound sensorineural hearing loss, left ear, and normal hearing sensitivity, right ear. Thresholds are 15-20 dB poorer in the high frequencies, left ear, and essentially unchanged, right ear, when compared to the previous audiogram dated 05/28/2021. Speech recognition ability in quiet was very poor at very loud levels, left ear, and excellent at average conversational levels, right ear.      Above results were reviewed with patient. Patient was informed that she is not a strong candidate for a traditional hearing aid in the left ear based on limited speech discrimination. She expressed that she is not interested in amplification at this time. Patient will follow up with Dr. Salvadore Farber in [facility] immediatley following today's appointment regarding changes in left ear hearing.     RECOMMENDATIONS: Audiologic re-evaluation as per Dr. Rennis Harding and/or Dr. Hyacinth Meeker.      Jenene Slicker, AuD, Landscape architect  [facility]

## 2022-12-29 ENCOUNTER — Ambulatory Visit: Payer: Medicare (Managed Care) | Admitting: Surgical

## 2022-12-29 ENCOUNTER — Other Ambulatory Visit: Payer: Self-pay

## 2022-12-29 ENCOUNTER — Encounter: Payer: Self-pay | Admitting: Surgery

## 2022-12-29 VITALS — BP 151/76 | HR 62 | Temp 98.1°F | Resp 16 | Ht 64.0 in | Wt 188.6 lb

## 2022-12-29 DIAGNOSIS — K432 Incisional hernia without obstruction or gangrene: Secondary | ICD-10-CM | POA: Insufficient documentation

## 2022-12-29 DIAGNOSIS — K436 Other and unspecified ventral hernia with obstruction, without gangrene: Secondary | ICD-10-CM

## 2022-12-29 NOTE — Progress Notes (Addendum)
Chief Complaint:   Chief Complaint   Patient presents with    New Patient Visit       HPI: It was my pleasure to see Robin Green, a 67 y.o. female presenting to the office today.  In early February, patient felt a "knot in my stomach, like something was sitting there," around her umbilicus  She describes the pain as pressure-like,  and was 7-8/10 at its worst and lasted about 1 week.  She felt that any kind of mental stress made this pain worse, but could not pinpoint any physical activity which cause the pain.  She states the pain has slowly resolved over time and she is pain-free today.  She has never felt a bulge or mass in her abdomen.  She was evaluated by her PCP, and ultrasound revealed small, fat containing umbilical hernia.  She is here to discuss elective repair of this symptomatic hernia.     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.42, G89.29    Lumbar spondylosis M47.816    Acute pain of left shoulder M25.512    Thickened endometrium R93.89    Status post hysteroscopy, polypectomy, D&C Z98.890    Right thigh pain M79.651    History of total knee replacement, right Z96.651    Incarcerated incisional hernia K43.0       Surgical History:  Past Surgical History:   Procedure Laterality Date    BREAST BIOPSY  2008    CHOLECYSTECTOMY, LAPAROSCOPIC      JOINT REPLACEMENT      left TKR    KNEE REPLACEMENT  2008/2017    x2    LUMBAR FUSION      L4-5 decompression with instrumented fusion    PR HYSTEROSCOPY BX  ENDOMETRIUM&/POLYPC W/WO D&C N/A 07/31/2020    Procedure: D & C HYSTEROSCOPY WITH MYOSURE, POLYPECTOMY;  Surgeon: Charyl Dancer, MD;  Location: [facility] MAIN OR;  Service: OBGYN       Social History:  Social History     Socioeconomic History    Marital status: Widowed   Occupational History    Occupation: retired   Tobacco Use    Smoking status: Never    Smokeless tobacco: Never   Substance and Sexual Activity    Alcohol use: No    Drug use: No    Sexual activity: Not Currently   Social History Narrative    Lives at home with daughter, [name]         Family History:  Family History   Problem Relation Age of Onset    Breast cancer Mother     Prostate cancer Father     Dementia Father     Diabetes Sister     Hypertension Sister     Cataracts Maternal Aunt     Diabetes Maternal Uncle  Coronary artery disease Maternal Uncle     Glaucoma Maternal Uncle     Amblyopia ([place]) Neg Hx     Blindness Neg Hx     Heart Disease Neg Hx     Macular degeneration Neg Hx     Retinal detachment Neg Hx     Strabismus Neg Hx     Anesthesia problems Neg Hx     Clotting disorder Neg Hx        Medications:  Current Outpatient Medications   Medication    CELEBREX 200 MG capsule    hydroCHLOROthiazide (HYDRODIURIL) 12.5 MG tablet    ONETOUCH VERIO test strip    blood glucose monitor device    ALPRAZolam (XANAX) 0.25 mg tablet    metFORMIN (GLUCOPHAGE) 500 mg tablet    blood pressure monitor    Cinnamon 500 MG capsule    nystatin (MYCOSTATIN) ointment    UNABLE TO FIND    lancets    Zinc Sulfate (ZINC-220 PO)    Potassium 99 MG TABS    TURMERIC PO    Non-System Medication    cholecalciferol (VITAMIN D) 1000 UNIT tablet    verapamil (CALAN-SR) 240 mg CR tablet     No current facility-administered medications for this visit.       Allergies:  Allergies   Allergen Reactions    Nifedipine Swelling    Seafood Itching    Glipizide Other (See Comments)     Memory concerns and numbness    Lisinopril Other (See Comments)     Dizziness and  cough        Review of Systems:  Constitutional: Pt has been in their normal state of health. Denies any fevers/chills, night sweats, unintentional change in weight.  She has never been diagnosed with OSA.   Eyes: Denies any blurry vision or yellowing of sclera.  Ears, Nose, Mouth, Throat: Denies any runny nose, sore throat, or ear pain.   CV: Denies chest pain, DOE, or palpitations.  Respiratory: Denies any shortness of breath, wheezing or cough.  GI: Denies melanotic stool, BRBPR, constipation or diarrhea.  Her last colonoscopy was in 2017 and reportedly normal.   GU: Denies any dysuria, hematuria or tea colored urine.  She is up to date on her mammogram and pap smears.  Musculoskeletal: Denies any trauma or significant arthritis.  Integumentary: Denies any rash, abrasion or burn.  Hematologic/Lymphatic: Denies any history of blood clot or bleeding disorder. Denies any lymphadenopathy.    All other systems negative.    Physical Exam:  BP 151/76   Pulse 62   Temp 36.7 C (98.1 F) (Temporal)   Resp 16   Ht 1.626 m (5\' 4" )   Wt 85.5 kg (188 lb 9.6 oz)   SpO2 99%   BMI 32.37 kg/m     Constitutional: Awake, alert, NAD. The patient is here with a family member.  Neck: No carotid bruit or palpable cervical lymphadenopathy appreciated bilaterally.  Eyes: Normal conjunctivae, sclera anicteric.   Ears, Nose, Mouth, Throat: Normal hearing. Mucus membranes moist. No pharyngeal erythema.  CV: RRR, no murmur appreciated.  Respiratory: Non-labored respirations. Lungs CTA bilaterally.  GI: Soft, ND, NT. No hepatosplenomegaly, or abdominal masses appreciated.  There is a well healed incision at her umbilicus.  There is a very small incarcerated supraumbilical incisional hernia that is non-tender to palpation.   GU: No suprapubic or CVA tenderness.  Musculoskeletal: Bilateral calves soft and NT.  Skin: Warm, dry, pink. No rash, abrasion or  breakdown.    Laboratory Data/Imaging:  Study Result    Narrative   10/22/2022 8:03  AM    LIMITED ABDOMINAL ULTRASOUND    CLINICAL INFORMATION:  abd pain, R10.9-Unspecified abdominal pain.      COMPARISON:  None.    TECHNIQUE: Sonographic evaluation of the right upper quadrant was performed using grayscale and color Doppler ultrasound.    FINDINGS:    Liver echogenicity: Normal.  Surface: Smooth.  Focal Lesions: None.    Gallbladder: Status post cholecystectomy.    Common Bile Duct: 0.2 cm Visualized portions unremarkable.  Hepatic Ducts: Normal.    Right Kidney: 10.3 cm  Echogenicity: Normal.  Hydronephrosis: None.  Calculi: None.  Focal Lesions: None.    Pancreas: Partially obscured. Head visualized. Body partially visualized. Tail obscured by bowel gas.      Incidentally noted fat containing umbilical hernia seen with a defect measuring approximately 0.6 cm.    Impression     Fat-containing umbilical hernia, with defect measuring approximately 0.6 cm.    Status post cholecystectomy. No biliary ductal dilatation.    END OF IMPRESSION.    I have personally reviewed the images and the Resident's/Fellow's interpretation and agree with or edited the findings.              [facility] submits this DICOM format image data and final report to the [facility], an independent secure electronic health information exchange, on a reciprocally searchable basis (with patient authorization) for a minimum of 12 months after exam  date.       Assessment and Plan:  1. Incarcerated incisional hernia            I had a long discussion with the patient here in the office today, explaining the concept of an umbilical hernia.  It is unclear if the pain she was experiencing is February is related to this tiny supraumbilical ventral hernia. We had a long discussion about surgical repair of this hernia vs observation.  She has opted for observation at this time and I feel that this is reasonable.  We have discussed the symptoms of a worsening hernia and she will notify us if she develops a bulge at the area or recurrent  pain.  She will follow up with [place] in clinic in 4 months or sooner if needed.      August Albino, [place]  12/29/2022 10:39 AM    GENERAL SURGERY ATTENDING   The APP has completed the documentation of this note under my direction. I personally saw, interviewed and examined the patient, reviewed the chart and the relevant imaging/lab findings, personally formulated the assessment and directed the management. Patient counseling and coordination of care provided.    Opal Dinning P. Keyon Liller MD, FACS

## 2023-01-06 ENCOUNTER — Other Ambulatory Visit: Payer: Self-pay

## 2023-01-06 ENCOUNTER — Ambulatory Visit: Payer: Medicare (Managed Care) | Attending: Optometry | Admitting: Optometry

## 2023-01-06 ENCOUNTER — Encounter: Payer: Self-pay | Admitting: Optometry

## 2023-01-06 DIAGNOSIS — H52223 Regular astigmatism, bilateral: Secondary | ICD-10-CM

## 2023-01-06 DIAGNOSIS — H52203 Unspecified astigmatism, bilateral: Secondary | ICD-10-CM | POA: Insufficient documentation

## 2023-01-06 DIAGNOSIS — H524 Presbyopia: Secondary | ICD-10-CM | POA: Insufficient documentation

## 2023-01-06 DIAGNOSIS — H40003 Preglaucoma, unspecified, bilateral: Secondary | ICD-10-CM | POA: Insufficient documentation

## 2023-01-06 DIAGNOSIS — H5203 Hypermetropia, bilateral: Secondary | ICD-10-CM

## 2023-01-06 DIAGNOSIS — E119 Type 2 diabetes mellitus without complications: Secondary | ICD-10-CM | POA: Insufficient documentation

## 2023-01-06 NOTE — Progress Notes (Signed)
Outpatient Visit      Patient name: Robin Green  DOB: Jan 24, 1956       Age: 67 y.o.  MR#: [id number]    Encounter Date: 01/06/2023    Subjective:      Chief Complaint   Patient presents with    Diabetes     HPI    Rosyln Rapoza is a 67 y.o. female here for a comprehensive eye exam with oct     rnfl. Pt states eyes have been stable. No changes. Denies pain, itching   and tearing. No new floaters or flashes of light.     Ocular meds: none    BG: 125 average  Lab Results       Component                Value               Date                       HA1C                     6.6 (H)             11/27/2022                  Last edited by Christena Deem, OD on 01/06/2023 10:17 AM.        has a current medication list which includes the following prescription(s): celebrex, hydrochlorothiazide, onetouch verio, blood glucose monitor, alprazolam, metformin, blood pressure monitor, cinnamon, nystatin, UNABLE TO FIND, lancets, zinc sulfate, potassium, turmeric, non-system medication, cholecalciferol, and verapamil.     is allergic to nifedipine, seafood, glipizide, and lisinopril.      Past Medical History:   Diagnosis Date    Anxiety     Diabetes     Diabetes mellitus     High blood pressure     Hypertension       Past Surgical History:   Procedure Laterality Date    BREAST BIOPSY  2008    CHOLECYSTECTOMY, LAPAROSCOPIC      JOINT REPLACEMENT      left TKR    KNEE REPLACEMENT  2008/2017    x2    LUMBAR FUSION      L4-5 decompression with instrumented fusion    PR HYSTEROSCOPY BX ENDOMETRIUM&/POLYPC W/WO D&C N/A 07/31/2020    Procedure: D & C HYSTEROSCOPY WITH MYOSURE, POLYPECTOMY;  Surgeon: Charyl Dancer, MD;  Location: [facility];  Service: OBGYN        Specialty Problems          Ophthalmology Problems    Type 2 diabetes mellitus                 Objective:     Base Eye Exam       Visual Acuity (Snellen - Linear)         Right Left    Dist cc 20/30 -2 20/30 -1    Near cc J1+ OU       Correction: Glasses               Tonometry (Tonopen, 9:48 AM)         Right Left    Pressure 16 19              Pupils         Dark Light Shape React APD    Right  4 2 Round Brisk None    Left 4 2 Round Brisk None              Visual Fields         Left Right     Full Full              Extraocular Movement         Right Left     Full Full              Neuro/Psych       Oriented x3: Yes    Mood/Affect: Normal              Dilation       Both eyes: 1.0% Tropicamide, 0.5% Proparacaine @ 9:48 AM                  Slit Lamp and Fundus Exam       External Exam         Right Left    External Normal ocular adnexae, lacrimal gland & drainage, orbits Normal ocular adnexae, lacrimal gland & drainage, orbits              Slit Lamp Exam         Right Left    Lids/Lashes Normal structure & position Normal structure & position    Conjunctiva/Sclera Normal bulbar/palpebral, conjunctiva, sclera Normal bulbar/palpebral, conjunctiva, sclera    Cornea Normal epithelium, stroma, endothelium, tear film Normal epithelium, stroma, endothelium, tear film    Anterior Chamber Clear & deep Clear & deep    Iris Normal shape, size, morphology Normal shape, size, morphology    Lens 1+ Nuclear sclerosis, 2+ Cortical cataract 1+ Nuclear sclerosis, 2+ Cortical cataract    Anterior Vitreous Clear Clear              Fundus Exam         Right Left    Disc Normal size, appearance, nerve fiber layer Normal size, appearance, nerve fiber layer    C/D Ratio 0.3 0.3    Macula Normal, (-)CME Normal, (-)CME    Vessels rare crossing change, no DR Normal, (-)hemes, cws, exudates, irma, vb, NVE    Periphery Normal Normal                  Refraction       Wearing Rx         Sphere Cylinder Axis Add    Right +1.75 -0.50 085 +2.50    Left +2.00 -0.75 066 +2.50      Type: PAL              Manifest Refraction         Sphere Cylinder Axis Dist VA Add    Right +2.00 -0.50 080 20/25 +2.50    Left +2.25 -1.00 075 20/20 +2.50              Final Rx         Sphere Cylinder Axis Dist VA Add    Right +2.00  -0.50 080 20/25 +2.50    Left +2.25 -1.00 075 20/20 +2.50      Type: PAL    Expiration Date: 01/05/2025                  Final Rx         Sphere Cylinder Axis Dist VA Add    Right +2.00 -0.50 080 20/25 +2.50    Left +2.25 -1.00  075 20/20 +2.50      Type: PAL    Expiration Date: 01/05/2025                  No annotated images are attached to the encounter.      Assessment/Plan:      1. Hyperopia with astigmatism and presbyopia, bilateral        2. Glaucoma suspect of both eyes  OCT, RNFL-OU    OCT, RNFL-OU      3. Diabetes mellitus type 2 without retinopathy              PLAN:  New glasses rx dispensed.   RNFL  Stable and normal OU  Borderline IOP  Thick pachys  Check in 1 year with repeat testing.     3. Pt educated on findings and on the importance of maintaining good blood sugar control and follow up with PCP. Monitor with annual dilated exam.

## 2023-01-15 ENCOUNTER — Other Ambulatory Visit: Payer: Self-pay | Admitting: Primary Care

## 2023-01-15 NOTE — Telephone Encounter (Signed)
Last ov 12/03/22  Future ov 03/18/23

## 2023-02-18 ENCOUNTER — Other Ambulatory Visit: Payer: Self-pay | Admitting: Primary Care

## 2023-02-18 NOTE — Telephone Encounter (Signed)
Last office visit: 11/30/2022   Last telemedicine visit:  Visit date not found   Next appointment: 03/18/2023

## 2023-03-15 ENCOUNTER — Ambulatory Visit: Payer: Medicare (Managed Care) | Admitting: Primary Care

## 2023-03-16 ENCOUNTER — Other Ambulatory Visit
Admission: RE | Admit: 2023-03-16 | Discharge: 2023-03-16 | Disposition: A | Discharge status: Home / Self Care | Payer: Medicare (Managed Care) | Source: Ambulatory Visit | Attending: Primary Care | Admitting: Primary Care

## 2023-03-16 DIAGNOSIS — E119 Type 2 diabetes mellitus without complications: Secondary | ICD-10-CM | POA: Insufficient documentation

## 2023-03-16 LAB — COMPREHENSIVE METABOLIC PANEL
ALT: 26 U/L (ref 0–35)
AST: 27 U/L (ref 0–35)
Albumin: 4.3 g/dL (ref 3.5–5.2)
Alk Phos: 61 U/L (ref 35–105)
Anion Gap: 13 (ref 7–16)
Bilirubin,Total: 0.5 mg/dL (ref 0.0–1.2)
CO2: 26 mmol/L (ref 20–28)
Calcium: 9.9 mg/dL (ref 8.6–10.2)
Chloride: 99 mmol/L (ref 96–108)
Creatinine: 1.04 mg/dL — ABNORMAL HIGH (ref 0.51–0.95)
Glucose: 98 mg/dL (ref 60–99)
Lab: 11 mg/dL (ref 6–20)
Potassium: 4.1 mmol/L (ref 3.3–5.1)
Sodium: 138 mmol/L (ref 133–145)
Total Protein: 7.3 g/dL (ref 6.3–7.7)
eGFR BY CREAT: 59 * — AB

## 2023-03-16 LAB — LIPID PANEL
Chol/HDL Ratio: 2.4
Cholesterol: 218 mg/dL — AB
HDL: 92 mg/dL — ABNORMAL HIGH (ref 40–60)
LDL Calculated: 114 mg/dL
Non HDL Cholesterol: 126 mg/dL
Triglycerides: 62 mg/dL

## 2023-03-17 LAB — HEMOGLOBIN A1C: Hemoglobin A1C: 6.5 % — ABNORMAL HIGH

## 2023-03-18 ENCOUNTER — Encounter: Payer: Self-pay | Admitting: Primary Care

## 2023-03-18 ENCOUNTER — Ambulatory Visit: Payer: Medicare (Managed Care) | Admitting: Primary Care

## 2023-03-18 ENCOUNTER — Other Ambulatory Visit: Payer: Self-pay

## 2023-03-18 VITALS — BP 124/72 | HR 71 | Ht 64.0 in | Wt 192.0 lb

## 2023-03-18 DIAGNOSIS — I10 Essential (primary) hypertension: Secondary | ICD-10-CM

## 2023-03-18 DIAGNOSIS — E782 Mixed hyperlipidemia: Secondary | ICD-10-CM

## 2023-03-18 DIAGNOSIS — E669 Obesity, unspecified: Secondary | ICD-10-CM

## 2023-03-18 DIAGNOSIS — Z0000 Encounter for general adult medical examination without abnormal findings: Secondary | ICD-10-CM

## 2023-03-18 DIAGNOSIS — L989 Disorder of the skin and subcutaneous tissue, unspecified: Secondary | ICD-10-CM

## 2023-03-18 DIAGNOSIS — K429 Umbilical hernia without obstruction or gangrene: Secondary | ICD-10-CM

## 2023-03-18 DIAGNOSIS — Z6832 Body mass index (BMI) 32.0-32.9, adult: Secondary | ICD-10-CM

## 2023-03-18 DIAGNOSIS — E119 Type 2 diabetes mellitus without complications: Secondary | ICD-10-CM

## 2023-03-18 MED ORDER — MOMETASONE FUROATE 0.1 % EX SOLN *A*
Freq: Every day | CUTANEOUS | 0 refills | Status: AC
Start: 2023-03-18 — End: 2023-03-25

## 2023-03-18 NOTE — Patient Instructions (Signed)
Thank you for completing your Annual Exam, Subsequent Annual Medicare Visit, Hypertension, Lab Results, and lump right breast   with [place] today.     The purpose of this visits was to:    Screen for disease  Assess risk of future medical problems  Help develop a healthy lifestyle  Update vaccines  Get to know your doctor in case of an illness    Patient Care Team:  Hetty Ely, MD as PCP - General (Primary Care)  Verita Schneiders, AUD as PCP - CCP-AUDIOLOGY  Charna Elizabeth, MD (Gastroenterology)  Dorna Leitz, DPM (Podiatry)  Dasilva, Catalina Antigua, OD (Ophthalmology)     Medicare 5 Year Plan    The following items were identified as areas of concern during your screening today:  Diabetes - This is a risk factor for Heart Attack, Stroke, Kidney Problems, Eye Problems and a loss of feeling in your fingers and feet.   High Blood Pressure (Hypertension) - This is a risk factor for Heart Attack, Stroke, Kidney Problems and Eye Problems.   BMI greater than 25 - This is a risk for Heart Attack, Stroke, High Blood Pressure, Diabetes, High Cholesterol and other complications.       The Health Maintenance table below identifies screening tests and immunizations recommended by your health care team:  Health Maintenance: These screening recommendations are based on USPSTF, [employer], and [place] state guidelines   Topic Date Due   . Shingles Vaccine (2 of 3) 11/08/2012   . COVID-19 Vaccine (1 - 2023-24 season) Never done   . Cervical Cancer Screening / HPV Testing  04/24/2023   . HIV Screening  11/30/2023 (Originally 08/16/1969)   . Flu Shot (1) 05/02/2023   . Diabetic A1C Monitoring  06/16/2023   . Colon Cancer Screening  06/27/2023   . Diabetic Foot Exam  07/18/2023   . Diabetic Kidney Disease Urine  07/18/2023   . Diabetic Kidney Disease Blood  03/15/2024   . Depression Screen Yearly  03/17/2024   . Fall Risk Screening  03/17/2024   . Breast Cancer Screening  03/30/2024   . Diabetic Eye Exam  01/05/2025    . DTaP/Tdap/Td Vaccines (2 - Td or Tdap) 09/25/2025   . Hepatitis C Screening  Completed   . Osteoporosis Screening  Completed   . Pneumococcal Vaccination  Completed   . Hepatitis B Vaccine  Aged Out   . HIB Vaccine  Aged Out   . HPV Vaccine  Aged Out   . Meningococcal Vaccine  Aged Out   . Rotavirus Vaccine  Aged Out     In addition, goals and orders placed to address these recommendations are listed in the "Today's Visit" section.    We wish you the best of health and look forward to seeing you again next year for your Annual Medicare Wellness Visit.     If you have any health care concerns before then, please do not hesitate to contact us.

## 2023-03-18 NOTE — Progress Notes (Signed)
Visit performed as:             Office Visit, met with patient in person    Today we reviewed and updated Robin Green's smoking status, activities of daily living, depression screen, fall risk, medications and allergies.   I have counseled the patient in the above areas.     Subjective:     Chief Complaint: Robin Green is a 67 y.o. female here for a/an Annual Exam, Subsequent Annual Medicare Visit, Hypertension, Lab Results, and lump right breast    In general, Robin Green rates their overall health as:  good      Patient Care Team:  Robin Ely, MD as PCP - General (Primary Care)  Robin Green, AUD as PCP - CCP-AUDIOLOGY  Robin Elizabeth, MD (Gastroenterology)  Robin Green, DPM (Podiatry)  Robin Green, Robin Green, OD (Ophthalmology)     Current Outpatient Medications on File Prior to Visit   Medication Sig Dispense Refill    metFORMIN (GLUCOPHAGE) 500 mg tablet TAKE 1 TABLET BY MOUTH TWICE A DAY WITH MEALS 180 tablet 1    verapamil (CALAN-SR) 240 mg CR tablet TAKE 1 TABLET (240 MG TOTAL) BY MOUTH 2 TIMES DAILY SWALLOW WHOLE. DO NOT CRUSH, BREAK, OR CHEW. 180 tablet 1    hydroCHLOROthiazide (HYDRODIURIL) 12.5 MG tablet TAKE 2 TABLETS BY MOUTH DAILY. 180 tablet 1    ONETOUCH VERIO test strip TEST USING 1 STRIP 3 TIMES DAILY 100 each 5    blood glucose monitor device By no specified route daily  Use to test blood sugar as directed 1 each 0    blood pressure monitor By no specified route daily  Check Bps 2-3 times/week 1 each 0    Cinnamon 500 MG capsule Take 500 mg by mouth daily      UNABLE TO FIND Med Name:Osteo Biflex      lancets Brand Verio Flex; Use three  times per day as directed for blood glucose testing. 100 each 5    Zinc Sulfate (ZINC-220 PO) Take by mouth      Potassium 99 MG TABS Take by mouth       TURMERIC PO Take 1 tablet by mouth daily      Non-System Medication BP cuff, large Dx: HTN      cholecalciferol (VITAMIN D) 1000 UNIT tablet Take 2 tablets (2,000 units total) by mouth  daily.      CELEBREX 200 MG capsule Take 1 capsule (200 mg total) by mouth daily.      ALPRAZolam (XANAX) 0.25 mg tablet TAKE 1 TABLET (0.25 MG TOTAL) BY MOUTH 2 TIMES DAILY AS NEEDED FOR ANXIETY MAX DAILY DOSE: 0.5 MG 60 tablet 0    nystatin (MYCOSTATIN) ointment Apply 1,000,000 g topically 2 times daily  to the following areas: under breast 15 g 3     No current facility-administered medications on file prior to visit.     Allergies   Allergen Reactions    Nifedipine Swelling    Seafood Itching    Glipizide Other (See Comments)     Memory concerns and numbness    Lisinopril Other (See Comments)     Dizziness and cough      Patient Active Problem List    Diagnosis Date Noted    Incarcerated ventral hernia 12/29/2022     supraumbilical      Right thigh pain 09/25/2022    History of total knee replacement, right 09/25/2022    Thickened endometrium 07/31/2020  Status post hysteroscopy, polypectomy, D&C 07/31/2020    Acute pain of left shoulder 02/20/2020    Lumbar spondylosis 09/29/2018    Chronic left-sided low back pain with left-sided sciatica 12/16/2017    Hyperlipidemia, mixed 03/17/2017    Sudden hearing loss 02/25/2017    Obesity (BMI 30-39.9) 02/25/2017    Type 2 diabetes mellitus 02/25/2017    Carpal tunnel syndrome 02/25/2017    HTN (hypertension) 02/25/2017    Osteoarthritis, knee 02/25/2017    Presence of right artificial knee joint 12/25/2015     Past Medical History:   Diagnosis Date    Anxiety     Diabetes     Diabetes mellitus     High blood pressure     Hypertension      Past Surgical History:   Procedure Laterality Date    BREAST BIOPSY  2008    CHOLECYSTECTOMY, LAPAROSCOPIC      JOINT REPLACEMENT      left TKR    KNEE REPLACEMENT  2008/2017    x2    LUMBAR FUSION      L4-5 decompression with instrumented fusion    PR HYSTEROSCOPY BX ENDOMETRIUM&/POLYPC W/WO D&C N/A 07/31/2020    Procedure: D & C HYSTEROSCOPY WITH MYOSURE, POLYPECTOMY;  Surgeon: Robin Dancer, MD;  Location: [facility];   Service: OBGYN     Family History   Problem Relation Age of Onset    Breast cancer Mother     Prostate cancer Father     Dementia Father     Diabetes Sister     Hypertension Sister     Cataracts Maternal Aunt     Diabetes Maternal Uncle     Coronary artery disease Maternal Uncle     Glaucoma Maternal Uncle     Amblyopia (Lazy Eye) Neg Hx     Blindness Neg Hx     Heart Disease Neg Hx     Macular degeneration Neg Hx     Retinal detachment Neg Hx     Strabismus Neg Hx     Anesthesia problems Neg Hx     Clotting disorder Neg Hx      Social History     Socioeconomic History    Marital status: Widowed   Occupational History    Occupation: retired   Tobacco Use    Smoking status: Never    Smokeless tobacco: Never   Substance and Sexual Activity    Alcohol use: No    Drug use: No    Sexual activity: Not Currently   Social History Narrative    Lives at home with daughter, Robin Green       Objective:     Vital Signs: BP 124/72   Pulse 71   Ht 1.626 m (5\' 4" )   Wt 87.1 kg (192 lb)   SpO2 97%   BMI 32.96 kg/m    BMI: Body mass index is 32.96 kg/m.    Vision Screening Results (Welcome visit only):  No results found.    Depression Screening Results:  Review Flowsheet  More data may exist         03/18/2023 03/09/2022 03/17/2021 01/31/2020 12/29/2019 12/29/2018 03/17/2017   PHQ Scores   PSQ2 Q1 - Interest/Pleasure - - - N N N N   PSQ2 Q2 - Down, Depressed, Hopeless - - - N N N N   PHQ Calculated Score 0 0 0 - - - -      Details  No questionnaires on file.   Opioid Use/DAST- 10 Screening Results:   How many times in the past year have you used an illegal drug or used a prescription medication for nonmedical reasons?: 0 (03/18/2023  1:12 PM)    Activities of Daily Living/Functional Screening Results:  Is the person deaf or does he/she have serious difficulty hearing?: N (03/18/2023  1:10 PM)  Is this person blind or does he/she have serious difficulty seeing even when wearing glasses?: N (03/18/2023  1:10 PM)  *Vision Status:  Visual aid  (03/18/2023  1:10 PM)  Does this person have serious difficulty walking or climbing stairs?: N (03/18/2023  1:10 PM)  Does this person have difficulty dressing or bathing?: N (03/18/2023  1:10 PM)  *Shopping: Independent (03/18/2023  1:10 PM)  *House Keeping: Independent (03/18/2023  1:10 PM)  *Managing Own Medications: Independent (03/18/2023  1:10 PM)  *Handling Finances: Independent (03/18/2023  1:10 PM)  Difficulty doing errands due to a physicial, mental or emotional condition: No (03/18/2023  1:10 PM)  Difficulty remembering or making decisions due to a physicial, mental or emotional condition: No (03/18/2023  1:10 PM)      Fall Risk Screening Results:  Have you fallen in the last year?: No (03/18/2023  1:10 PM)  Do you feel you are at risk for falling?: No (03/18/2023  1:10 PM)      Assessment and Plan:     Cognitive Function:  Recall of recent and remote events appears:  Normal      Advanced Care Planning:  was discussed and the paperwork can be found in the scanned media section     The following health maintenance plan was reviewed with the patient:    Health Maintenance Topics with due status: Overdue       Topic Date Due    IMM-Zoster 11/08/2012    COVID-19 Vaccine Never done     Health Maintenance Topics with due status: Due Soon       Topic Date Due    Cervical Cancer Screening (USPSTF/ACOG) 04/24/2023     Health Maintenance Topics with due status: Postponed       Topic Postponed Until    HIV Scre[place] US[place]ST[place] 11/30/2023 (Originally 08/16/1969)     Health Maintenance Topics with due status: Not Due       Topic Last Completion Date    IMM DTaP/Tdap/Td 09/26/2015    Colon Cancer Screening Other 06/26/2016    Breast Cancer Screening Other 03/30/2022    IMM-Influenza 07/17/2022    Diabetic Foot Exam ADA 07/17/2022    Diabetic Nephropathy Screening - Urine 07/17/2022    Diabetic Eye Exam ADA 01/06/2023    Diabetic A1C Monitoring Other 03/16/2023    Diabetic Nephropathy Screening - Blood 03/16/2023     Depression Screen Yearly 03/18/2023    Fall Risk Screening 03/18/2023     Health Maintenance Topics with due status: Completed       Topic Last Completion Date    Hepatitis C Screening USPSTF/[place] 06/10/2016    IMM Pneumo: 65+ Years 03/17/2021    Osteoporosis Screening USPSTF 08/21/2022     Health Maintenance Topics with due status: Aged [employer] Date Due    IMM-Hepatitis B Vaccine Aged Out    IMM-HIB 0-5 Yrs or At-Risk Patients Aged Out    IMM-HPV 9-26 Yrs or Shared Decision (27-45 Yrs) Aged Out    IMM-MCV4 0-18 Yrs or At-Risk Patients Aged Out    IMM-Rotavirus 0-8  Months Aged Out     This health maintenance schedule, identified risks, a list of orders placed today and patient goals have been provided to Robin Green in the after visit summary.     Plan for any concerns identified during screening or risk assessments:  n/a

## 2023-03-18 NOTE — Progress Notes (Signed)
[facility]  Progress Note    Reason For Visit   Adult Comprehensive Physical Exam.    HPI   Today has concerns of: Patient seen for Medicare wellness and for physical.  No new concerns.  Admits to being noncompliant with diet over the summer.  States she eats a lot of nuts.  Has a spot on the right side of her breast that she would like looked at and a few skin moles.  Does not have a dermatologist  Interim history reviewed    The Problem List, MHx, SHx, FHx, Immunizations, Medications, and Allergies were reviewed with the patient today during the appointment and updates were made today.    Allergies   Allergen Reactions    Nifedipine Swelling    Seafood Itching    Glipizide Other (See Comments)     Memory concerns and numbness    Lisinopril Other (See Comments)     Dizziness and cough      Current Outpatient Medications   Medication Sig Dispense Refill    metFORMIN (GLUCOPHAGE) 500 mg tablet TAKE 1 TABLET BY MOUTH TWICE A DAY WITH MEALS 180 tablet 1    verapamil (CALAN-SR) 240 mg CR tablet TAKE 1 TABLET (240 MG TOTAL) BY MOUTH 2 TIMES DAILY SWALLOW WHOLE. DO NOT CRUSH, BREAK, OR CHEW. 180 tablet 1    hydroCHLOROthiazide (HYDRODIURIL) 12.5 MG tablet TAKE 2 TABLETS BY MOUTH DAILY. 180 tablet 1    ONETOUCH VERIO test strip TEST USING 1 STRIP 3 TIMES DAILY 100 each 5    blood glucose monitor device By no specified route daily  Use to test blood sugar as directed 1 each 0    blood pressure monitor By no specified route daily  Check Bps 2-3 times/week 1 each 0    Cinnamon 500 MG capsule Take 500 mg by mouth daily      UNABLE TO FIND Med Name:Osteo Biflex      lancets Brand Verio Flex; Use three  times per day as directed for blood glucose testing. 100 each 5    Zinc Sulfate (ZINC-220 PO) Take by mouth      Potassium 99 MG TABS Take by mouth       TURMERIC PO Take 1 tablet by mouth daily      Non-System Medication BP cuff, large Dx: HTN      cholecalciferol (VITAMIN D) 1000 UNIT tablet Take 2 tablets (2,000  units total) by mouth daily.      mometasone (ELOCON) 0.1 % lotion Apply topically daily for 7 days. to the following areas: left ear 30 mL 0    CELEBREX 200 MG capsule Take 1 capsule (200 mg total) by mouth daily.      ALPRAZolam (XANAX) 0.25 mg tablet TAKE 1 TABLET (0.25 MG TOTAL) BY MOUTH 2 TIMES DAILY AS NEEDED FOR ANXIETY MAX DAILY DOSE: 0.5 MG 60 tablet 0    nystatin (MYCOSTATIN) ointment Apply 1,000,000 g topically 2 times daily  to the following areas: under breast 15 g 3     No current facility-administered medications for this visit.     Patient Active Problem List    Diagnosis Date Noted    Incarcerated ventral hernia 12/29/2022     supraumbilical      Right thigh pain 09/25/2022    History of total knee replacement, right 09/25/2022    Thickened endometrium 07/31/2020    Status post hysteroscopy, polypectomy, D&C 07/31/2020    Acute pain of left shoulder 02/20/2020  Lumbar spondylosis 09/29/2018    Chronic left-sided low back pain with left-sided sciatica 12/16/2017    Hyperlipidemia, mixed 03/17/2017    Sudden hearing loss 02/25/2017    Obesity (BMI 30-39.9) 02/25/2017    Type 2 diabetes mellitus 02/25/2017    Carpal tunnel syndrome 02/25/2017    HTN (hypertension) 02/25/2017    Osteoarthritis, knee 02/25/2017    Presence of right artificial knee joint 12/25/2015     Past Medical History:   Diagnosis Date    Anxiety     Diabetes     Diabetes mellitus     High blood pressure     Hypertension      Past Surgical History:   Procedure Laterality Date    BREAST BIOPSY  2008    CHOLECYSTECTOMY, LAPAROSCOPIC      JOINT REPLACEMENT      left TKR    KNEE REPLACEMENT  2008/2017    x2    LUMBAR FUSION      L4-5 decompression with instrumented fusion    PR HYSTEROSCOPY BX ENDOMETRIUM&/POLYPC W/WO D&C N/A 07/31/2020    Procedure: D & C HYSTEROSCOPY WITH MYOSURE, POLYPECTOMY;  Surgeon: Charyl Dancer, MD;  Location: [facility];  Service: OBGYN     Family History   Problem Relation Age of Onset    Breast  cancer Mother     Prostate cancer Father     Dementia Father     Diabetes Sister     Hypertension Sister     Cataracts Maternal Aunt     Diabetes Maternal Uncle     Coronary artery disease Maternal Uncle     Glaucoma Maternal Uncle     Amblyopia (Lazy Eye) Neg Hx     Blindness Neg Hx     Heart Disease Neg Hx     Macular degeneration Neg Hx     Retinal detachment Neg Hx     Strabismus Neg Hx     Anesthesia problems Neg Hx     Clotting disorder Neg Hx      Social History     Socioeconomic History    Marital status: Widowed   Tobacco Use    Smoking status: Never    Smokeless tobacco: Never   Substance and Sexual Activity    Alcohol use: No    Drug use: No    Sexual activity: Not Currently   Social History Narrative    Lives at home with daughter, [name]        ROS   Review of Systems   Constitutional:  Negative for chills and fever.   HENT:  Negative for sore throat.    Respiratory:  Negative for cough, shortness of breath and wheezing.    Cardiovascular:  Negative for chest pain and palpitations.   Gastrointestinal:  Negative for heartburn, nausea and vomiting.   Genitourinary:  Negative for dysuria.   Musculoskeletal:  Negative for back pain and neck pain.   Skin:  Negative for rash.   Neurological:  Negative for dizziness and headaches.   Psychiatric/Behavioral:  Negative for depression. The patient is not nervous/anxious.        Vitals  Blood pressure 124/72, pulse 71, height 1.626 m (5\' 4" ), weight 87.1 kg (192 lb), SpO2 97%.    Physical Exam  Physical Exam  Vitals reviewed.   Constitutional:       General: She is not in acute distress.     Appearance: She is not diaphoretic.  Comments: Pleasant lady in no distress   HENT:      Head: Normocephalic and atraumatic.      Right Ear: Tympanic membrane, ear canal and external ear normal.      Left Ear: Tympanic membrane, ear canal and external ear normal.      Nose: Nose normal.      Mouth/Throat:      Mouth: Mucous membranes are moist.      Pharynx: Oropharynx is  clear. No oropharyngeal exudate.   Eyes:      General: No scleral icterus.        Right eye: No discharge.         Left eye: No discharge.      Conjunctiva/sclera: Conjunctivae normal.      Pupils: Pupils are equal, round, and reactive to light.   Neck:      Thyroid: No thyromegaly.   Cardiovascular:      Rate and Rhythm: Normal rate and regular rhythm.      Heart sounds: Normal heart sounds. No murmur heard.     No gallop.   Pulmonary:      Effort: Pulmonary effort is normal. No respiratory distress.      Breath sounds: Normal breath sounds. No wheezing or rales.   Chest:      Comments: Small raised palpable blue nevus noted on the right breast inner quadrant.  Abdominal:      General: There is no distension.      Palpations: Abdomen is soft. There is no mass.      Tenderness: There is no abdominal tenderness. There is no guarding or rebound.      Hernia: A hernia is present. Hernia is present in the umbilical area.   Musculoskeletal:         General: Normal range of motion.      Cervical back: Neck supple.   Lymphadenopathy:      Cervical: No cervical adenopathy.   Skin:     General: Skin is warm and dry.      Comments: Several raised uniformly dark skin moles noted on both extremities   Neurological:      Mental Status: She is alert and oriented to person, place, and time.      Cranial Nerves: No cranial nerve deficit.   Psychiatric:         Mood and Affect: Mood and affect normal.         Lab Results  Lab Results   Component Value Date    WBC 5.1 03/06/2022    HGB 13.1 03/06/2022    HCT 40 03/06/2022    MCV 90 03/06/2022    PLT 253 03/06/2022       Chemistry        Lab results: 03/16/23  1444   Sodium 138   Potassium 4.1   Chloride 99   CO2 26   UN 11   Creatinine 1.04*        Lab results: 03/16/23  1444   Glucose 98   Calcium 9.9   Total Protein 7.3   Albumin 4.3   ALT 26   AST 27   Alk Phos 61   Bilirubin,Total 0.5          Lab Results   Component Value Date    CHOL 218 (!) 03/16/2023    HDL 92 (H) 03/16/2023     LDLC 114 03/16/2023    TRIG 62 03/16/2023    CHHDC 2.4 03/16/2023  Lab Results   Component Value Date    TSH 0.79 03/23/2019     Hemoglobin A1C   Date Value Ref Range Status   03/16/2023 6.5 (H) % Final     Comment:     Ref Range <=5.6  HbA1c values of 5.7-6.4% indicate an increased risk for developing  diabetes mellitus.  HbA1c values greater than or equal to 6.5% are diagnostic of  diabetes mellitus.  For diagnosis of diabetes in individuals without unequivocal  hyperglycemia, results should be confirmed by repeat testing.           Assessment   This is a 67 y.o.female who comes in today for a physical exam    1. Preventative health care  Reviewed healthy lifestyle measures.  Reviewed routine preventive care and immunizations. Reviewed current guidelines re : nutrition ,sleep ,hydration and exercise.      2. Type 2 diabetes mellitus  Follow low-carb diet and continue to work on weight loss.  A1c is at goal.  Continue metformin at current dosage.  Continue verapamil as prescribed.  Continue to work on weight loss  - Hemoglobin A1c; Future  - Comprehensive metabolic panel; Future  - Lipid Panel (Reflex to Direct  LDL if Triglycerides more than 400); Future    3. Essential hypertension  Blood pressure is well-controlled.  Continue verapamil at current dosage and hydrochlorothiazide as prescribed.  Follow a low-salt diet and exercise 30 minutes most days of the week.  Continue to work on weight loss.  Goal blood pressure of 130/90 or less    4. Hyperlipidemia, mixed  The 10-year ASCVD risk score (Arnett DK, et al., 2019) is: 21.9%    Values used to calculate the score:      Age: 78 years      Sex: Female      Is Non-Hispanic African American: Yes      Diabetic: Yes      Tobacco smoker: No      Systolic Blood Pressure: 124 mmHg      Is BP treated: Yes      HDL Cholesterol: 92 mg/dL      Total Cholesterol: 218 mg/dL   Strongly urged to consider a statin.  Patient is hesitant.  She wants to do aggressive lifestyle  measures and cut back on nuts and processed foods and recheck blood work.  Well aware of increased risk for heart attack and stroke    5. Obesity (BMI 30-39.9)  Continue to work on weight loss    6. Umbilical hernia  Stable.  Patient reassured.  Call or return to the surgeon with any concerns.  Was previously evaluated    7. Skin lesion  See referral to dermatology for evaluation and excision as needed.  - AMB REFERRAL TO DERMATOLOGY     Follow up: Follow up in about 4 months (around 07/19/2023) for Follow-up, diabetes.    Author: Hetty Ely, MD  Note created: 03/18/2023  at: 1:55 PM

## 2023-04-02 ENCOUNTER — Other Ambulatory Visit: Payer: Self-pay | Admitting: Primary Care

## 2023-04-02 DIAGNOSIS — I10 Essential (primary) hypertension: Secondary | ICD-10-CM

## 2023-04-02 NOTE — Telephone Encounter (Signed)
Refill request    Medication:     Confirm that the patient wants the medication sent to:   [id number] - [place], [place] - [address].  [address] [place] [address]  Phone: [phone] Fax: [phone]        Additional message:     Relevant labs:    Last Office Visit       Date Provider Department Visit Type Primary Dx    03/18/2023 Hetty Ely, MD [facility] Office Visit Preventative health care            Last Telemedicine Visit       Date Provider Department Visit Type Primary Dx    12/29/2018 Hetty Ely, MD [facility] Telemedicine Visit Preventative health care          Next visit: Future Encounters      07/22/2023  1:20 PM [facility]    FOLLOW UP VISIT    PR[facility]    Hetty Ely, MD                         Additional information to be completed by clinical staff:  If the request is for a controlled substance please paste the iStop results below:      **If the refill protocol indicates they are due for labs please order them and tell the patient to have them drawn**

## 2023-04-05 ENCOUNTER — Other Ambulatory Visit: Payer: Self-pay | Admitting: Obstetrics

## 2023-04-05 ENCOUNTER — Other Ambulatory Visit: Payer: Self-pay | Admitting: Gastroenterology

## 2023-04-08 ENCOUNTER — Telehealth: Payer: Self-pay | Admitting: Primary Care

## 2023-04-08 NOTE — Telephone Encounter (Signed)
Pt called to advise she is unable to get into dermatology office referred to until January of 2025, she requested Dr Rennis Harding to review/recommend another provider. Writer informed pt she should research and call other derm offices to see when they could schedule her and if they take her insurance and call our office back if she find another provider. Routing to PCP per pt request to see if Dr Rennis Harding has any additional recommendations

## 2023-04-08 NOTE — Telephone Encounter (Signed)
Spoke to pt and she understands advise

## 2023-04-30 ENCOUNTER — Other Ambulatory Visit: Payer: Self-pay | Admitting: Primary Care

## 2023-04-30 DIAGNOSIS — E119 Type 2 diabetes mellitus without complications: Secondary | ICD-10-CM

## 2023-04-30 NOTE — Telephone Encounter (Signed)
Refill request    Medication:    one touch Verio strip    Confirm that the patient wants the medication sent to:   [id number] - [place], [place] - [address] [place].  [address] [place] [address]  Phone: [phone] Fax: [phone]        Additional message:     Relevant labs:    Last Office Visit       Date Provider Department Visit Type Primary Dx    03/18/2023 Hetty Ely, MD [facility] Office Visit Preventative health care            Last Telemedicine Visit       Date Provider Department Visit Type Primary Dx    12/29/2018 Hetty Ely, MD [facility] Telemedicine Visit Preventative health care          Last Telemedicine Visit    None          Next visit: Future Encounters      07/22/2023  1:20 PM Sch    FOLLOW UP VISIT    PRA    Hetty Ely, MD                         Additional information to be completed by clinical staff:  If the request is for a controlled substance please paste the iStop results below:      **If the refill protocol indicates they are due for labs please order them and tell the patient to have them drawn**

## 2023-05-04 ENCOUNTER — Encounter: Payer: Self-pay | Admitting: Surgery

## 2023-05-04 ENCOUNTER — Other Ambulatory Visit: Payer: Self-pay

## 2023-05-04 ENCOUNTER — Ambulatory Visit: Payer: Medicare (Managed Care) | Admitting: Surgery

## 2023-05-04 VITALS — BP 149/73 | HR 54 | Temp 97.2°F | Resp 16 | Ht 64.0 in | Wt 192.0 lb

## 2023-05-04 DIAGNOSIS — K432 Incisional hernia without obstruction or gangrene: Secondary | ICD-10-CM

## 2023-05-04 DIAGNOSIS — I10 Essential (primary) hypertension: Secondary | ICD-10-CM

## 2023-05-04 DIAGNOSIS — E669 Obesity, unspecified: Secondary | ICD-10-CM

## 2023-05-04 DIAGNOSIS — E782 Mixed hyperlipidemia: Secondary | ICD-10-CM

## 2023-05-04 DIAGNOSIS — E119 Type 2 diabetes mellitus without complications: Secondary | ICD-10-CM

## 2023-05-04 NOTE — Progress Notes (Signed)
Chief Complaint: Incisional hernia  Chief Complaint   Patient presents with    Follow-up       HPI: It was my pleasure to see Robin Green, a 67 y.o. female returning to the office today.  Please see our clinic visit from 12/29/22 describing the history of her incisional hernia at the site of her umbilical port from a laparoscopic cholecystectomy done at [facility] years ago.  She initially had pain in February 2024 but the pain had resolved by the time we saw her in April.  The hernia was small but I wanted to see her again in 4 months to make sure it was not increasing in size or symptoms and she is here for that reason today.  She denies any significant changes in her health over the past 4 months.  She does have some occasional stomach "upset" but denies any focal pain near her umbilicus.  She has not noticed a bulge.  She is here for reevaluation.    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.42, G89.29    Lumbar spondylosis M47.816    Acute pain of left shoulder M25.512    Thickened endometrium R93.89    Status post hysteroscopy, polypectomy, D&C Z98.890    Right thigh pain M79.651    History of total knee replacement, right Z96.651    Incisional hernia K43.2       Surgical History:  Past Surgical History:   Procedure Laterality Date    BREAST BIOPSY  2008    CHOLECYSTECTOMY, LAPAROSCOPIC      JOINT REPLACEMENT      left TKR    KNEE REPLACEMENT  2008/2017    x2    LUMBAR FUSION      L4-5 decompression with  instrumented fusion    PR HYSTEROSCOPY BX ENDOMETRIUM&/POLYPC W/WO D&C N/A 07/31/2020    Procedure: D & C HYSTEROSCOPY WITH MYOSURE, POLYPECTOMY;  Surgeon: Charyl Dancer, MD;  Location: [facility];  Service: OBGYN       Social History:  Social History     Socioeconomic History    Marital status: Widowed   Occupational History    Occupation: retired   Tobacco Use    Smoking status: Never    Smokeless tobacco: Never   Substance and Sexual Activity    Alcohol use: No    Drug use: No    Sexual activity: Not Currently   Social History Narrative    Lives at home with daughter, [name]         Family History:  Family History   Problem Relation Age of Onset    Breast cancer Mother     Prostate cancer Father     Dementia Father     Diabetes Sister     Hypertension Sister  Cataracts Maternal Aunt     Diabetes Maternal Uncle     Coronary artery disease Maternal Uncle     Glaucoma Maternal Uncle     Amblyopia ([place]) Neg Hx     Blindness Neg Hx     Heart Disease Neg Hx     Macular degeneration Neg Hx     Retinal detachment Neg Hx     Strabismus Neg Hx     Anesthesia problems Neg Hx     Clotting disorder Neg Hx        Medications:  Current Outpatient Medications   Medication    ONETOUCH VERIO test strip    hydroCHLOROthiazide 12.5 MG tablet    metFORMIN (GLUCOPHAGE) 500 mg tablet    verapamil (CALAN-SR) 240 mg CR tablet    CELEBREX 200 MG capsule    blood glucose monitor device    blood pressure monitor    Cinnamon 500 MG capsule    UNABLE TO FIND    lancets    Zinc Sulfate (ZINC-220 PO)    Potassium 99 MG TABS    TURMERIC PO    Non-System Medication    cholecalciferol (VITAMIN D) 1000 UNIT tablet    ALPRAZolam (XANAX) 0.25 mg tablet    nystatin (MYCOSTATIN) ointment     No current facility-administered medications for this visit.       Allergies:  Allergies   Allergen Reactions    Nifedipine Swelling    Seafood Itching    Glipizide Other (See Comments)     Memory concerns and numbness    Lisinopril Other (See  Comments)     Dizziness and cough        Review of Systems:  Constitutional: Pt has been in their normal state of health. Denies any fevers/chills, night sweats, unintentional change in weight.  Eyes: Denies any blurry vision or yellowing of sclera.  Ears, Nose, Mouth, Throat: Denies any runny nose, sore throat, or ear pain.   CV: Denies chest pain, DOE, or palpitations.  Respiratory: Denies any shortness of breath, wheezing or cough.  GI: Denies melanotic stool, BRBPR, constipation or diarrhea.  He states that she had a normal colonoscopy in 2017 and is scheduled for another one next month.    GU: Denies any dysuria, hematuria or tea colored urine.  Musculoskeletal: Denies any trauma or newly diagnosed significant arthritis.  Integumentary: Denies any rash, abrasion or burn.  Hematologic/Lymphatic: Denies any history of blood clot or bleeding disorder. Denies any lymphadenopathy.    All other systems negative.    Physical Exam:  BP 149/73 (BP Location: Left arm, Patient Position: Sitting, Cuff Size: adult)   Pulse 54   Temp 36.2 C (97.2 F) (Temporal)   Resp 16   Ht 1.626 m (5\' 4" )   Wt 87.1 kg (192 lb)   SpO2 99%   BMI 32.96 kg/m     Constitutional: Awake, alert, NAD. The patient is here alone.  Neck: No carotid bruit or palpable cervical lymphadenopathy appreciated bilaterally.  Eyes: Normal conjunctivae, sclera anicteric.   Ears, Nose, Mouth, Throat: Normal hearing. Mucus membranes moist. No pharyngeal erythema.  CV: RRR, no murmur appreciated.  Respiratory: Non-labored respirations. Lungs CTA bilaterally.  GI: Soft, ND, obese, NT. No hepatosplenomegaly, or abdominal masses appreciated.  Her umbilicus is a crease of her abdominal wall adipose tissue.  There is a small mostly reducible umbilical hernia that is nontender during attempts of reduction.  I am unable to definitely appreciate a significant supraumbilical hernia  on today's exam.  GU: No suprapubic or CVA tenderness.  Musculoskeletal: Bilateral  calves soft and NT.  Skin: Warm, dry, pink. No rash, abrasion or breakdown.    Laboratory Data/Imaging:  N/C    Assessment and Plan:  1. Incisional hernia, without obstruction or gangrene        2. Hyperlipidemia, mixed        3. Hypertension, unspecified type        4. Type 2 diabetes mellitus        5. Obesity (BMI 30-39.9)            I had another long discussion with the patient here in the clinic today.  I reexplained the concept of an incisional hernia.  I certainly do not feel any progression of her hernia on physical exam over the past 4 months.  She is not having much in the way of symptoms.  At this point I think it is certainly reasonable to observe this small periumbilical incisional hernia.  We discussed progression of symptoms and she will contact me if this occurs.  She will also watch for any palpable abnormality in the area.  Otherwise she will follow-up with her primary care doctor and return to this clinic as needed.    Laurine Blazer, MD  05/04/2023 11:38 AM

## 2023-05-24 ENCOUNTER — Other Ambulatory Visit
Admission: RE | Admit: 2023-05-24 | Discharge: 2023-05-24 | Disposition: A | Discharge status: Home / Self Care | Payer: Medicare (Managed Care) | Source: Ambulatory Visit | Attending: Obstetrics | Admitting: Obstetrics

## 2023-05-24 DIAGNOSIS — Z124 Encounter for screening for malignant neoplasm of cervix: Secondary | ICD-10-CM | POA: Insufficient documentation

## 2023-05-24 DIAGNOSIS — Z01419 Encounter for gynecological examination (general) (routine) without abnormal findings: Secondary | ICD-10-CM | POA: Insufficient documentation

## 2023-06-02 LAB — GYN CYTOLOGY

## 2023-06-03 ENCOUNTER — Encounter: Payer: Self-pay | Admitting: Gastroenterology

## 2023-06-03 LAB — HM COLONOSCOPY

## 2023-07-01 ENCOUNTER — Encounter: Payer: Self-pay | Admitting: Gastroenterology

## 2023-07-05 LAB — MICROALBUMIN, URINE, RANDOM
Creatinine,UR: 23.5 mg/dL
Microalb/Creat Ratio: 7.2 mg MA/g CR
Microalbumin,UR: <2 mg/dL

## 2023-07-15 ENCOUNTER — Encounter: Payer: Self-pay | Admitting: Primary Care

## 2023-07-19 ENCOUNTER — Other Ambulatory Visit: Payer: Self-pay | Admitting: Primary Care

## 2023-07-19 ENCOUNTER — Other Ambulatory Visit
Admission: RE | Admit: 2023-07-19 | Discharge: 2023-07-19 | Disposition: A | Discharge status: Home / Self Care | Payer: Medicare (Managed Care) | Source: Ambulatory Visit | Attending: Primary Care | Admitting: Primary Care

## 2023-07-19 DIAGNOSIS — E119 Type 2 diabetes mellitus without complications: Secondary | ICD-10-CM | POA: Insufficient documentation

## 2023-07-19 LAB — COMPREHENSIVE METABOLIC PANEL
ALT: 23 U/L (ref 0–35)
AST: 27 U/L (ref 0–35)
Albumin: 4.3 g/dL (ref 3.5–5.2)
Alk Phos: 56 U/L (ref 35–105)
Anion Gap: 12 (ref 7–16)
Bilirubin,Total: 0.6 mg/dL (ref 0.0–1.2)
CO2: 26 mmol/L (ref 20–28)
Calcium: 9.6 mg/dL (ref 8.6–10.2)
Chloride: 99 mmol/L (ref 96–108)
Creatinine: 0.91 mg/dL (ref 0.51–0.95)
Glucose: 90 mg/dL (ref 60–99)
Lab: 12 mg/dL (ref 6–20)
Potassium: 3.6 mmol/L (ref 3.3–5.1)
Sodium: 137 mmol/L (ref 133–145)
Total Protein: 7.3 g/dL (ref 6.3–7.7)
eGFR BY CREAT: 69 *

## 2023-07-19 LAB — LIPID PANEL
Chol/HDL Ratio: 2.3
Cholesterol: 220 mg/dL — AB
HDL: 94 mg/dL — ABNORMAL HIGH (ref 40–60)
LDL Calculated: 115 mg/dL
Non HDL Cholesterol: 126 mg/dL
Triglycerides: 57 mg/dL

## 2023-07-19 NOTE — Telephone Encounter (Signed)
Refill request    Medication: verapamil (CALAN-SR) 240 mg CR tablet      Confirm that the patient wants the medication sent to:   [id number] - [place], [place] - [address] [facility] [address].  [address] [place] [address]  Phone: [phone] Fax: [phone]        Additional message:     Relevant labs:    Last Office Visit       Date Provider Department Visit Type Primary Dx    03/18/2023 Hetty Ely, MD [facility] Office Visit Preventative health care            Last Telemedicine Visit       Date Provider Department Visit Type Primary Dx    12/29/2018 Hetty Ely, MD [facility] Telemedicine Visit Preventative health care          Last PA Office Visit    None          Next visit: Future Encounters      07/22/2023  1:20 PM [facility]    FOLLOW UP VISIT    PRA    Hetty Ely, MD                         Additional information to be completed by clinical staff:  If the request is for a controlled substance please paste the iStop results below:      **If the refill protocol indicates they are due for labs please order them and tell the patient to have them drawn**

## 2023-07-20 LAB — HEMOGLOBIN A1C: Hemoglobin A1C: 6.6 % — ABNORMAL HIGH

## 2023-07-20 MED ORDER — VERAPAMIL HCL CR 240 MG PO TBCR *I*
240.0000 mg | ORAL_TABLET | Freq: Two times a day (BID) | ORAL | 1 refills | Status: DC
Start: 2023-07-20 — End: 2024-01-17

## 2023-07-22 ENCOUNTER — Telehealth: Payer: Self-pay | Admitting: Primary Care

## 2023-07-22 ENCOUNTER — Encounter: Payer: Self-pay | Admitting: Primary Care

## 2023-07-22 ENCOUNTER — Ambulatory Visit: Payer: Medicare (Managed Care) | Admitting: Primary Care

## 2023-07-22 VITALS — BP 124/68 | HR 73 | Resp 16 | Wt 190.0 lb

## 2023-07-22 DIAGNOSIS — E782 Mixed hyperlipidemia: Secondary | ICD-10-CM

## 2023-07-22 DIAGNOSIS — E669 Obesity, unspecified: Secondary | ICD-10-CM

## 2023-07-22 DIAGNOSIS — E119 Type 2 diabetes mellitus without complications: Secondary | ICD-10-CM

## 2023-07-22 DIAGNOSIS — I10 Essential (primary) hypertension: Secondary | ICD-10-CM

## 2023-07-22 NOTE — Telephone Encounter (Signed)
Pt requesting DEXA scan order to be placed, forgot to ask Dr. Rennis Harding at appt today- please advise. Please call pt when/if order is placed so she can know to schedule

## 2023-07-22 NOTE — Telephone Encounter (Signed)
Patient was called and informed , verbalizes understanding , agreed

## 2023-07-22 NOTE — Progress Notes (Signed)
 [facility]     Subjective     Robin Green is a 67 y.o. female who presents for Follow-up, Diabetes, and Lab Results  History of Present Illness  The patient is a 67 year old female who presents for follow-up.  Patient seen for follow-up of the following chronic medical problems    Diabetes-needs to do better with her diet according to patient.    High blood pressure-no headache  Hyperlipidemia-no chest pain  Obesity-working on weight loss    She reports that her surgeon has confirmed the presence of a small hernia, which is not causing any discomfort. She was advised to contact the surgeon if any issues arise.    A colonoscopy was performed on 06/03/2023, with the next one scheduled in 10 years.    She reports no respiratory or cardiac symptoms such as coughing, wheezing, chest pain, or palpitations.    She has noticed bruising at the site of blood draws.    She is currently under significant stress due to personal issues involving her grandson, which has led to a court case. Her daughter is providing her with support at home.       Objective   Blood pressure 124/68, pulse 73, resp. rate 16, SpO2 98%, not currently breastfeeding.  Physical Exam  Vital Signs  Patient's weight is 190 pounds.  Pleasant lady in no distress  HEENT normocephalic atraumatic  Lungs are clear to auscultation, good air entry, no added sounds  Heart is regular rate and rhythm S1-S2 plus no murmur  Normal bilateral pedal pulses, normal monofilament exam bilaterally  Results  Laboratory Studies  Urine albumin creatinine ratio is normal. Blood sugar is 6.6. Creatinine is 0.91. GFR is 69. Total cholesterol is 220, triglycerides are 57, good cholesterol is 94, bad cholesterol is 115.          Assessment & Plan  1. Type 2 diabetes mellitus (Primary)  Her hemoglobin A1c is slightly elevated at 6.6, up from 6.5 previously. Kidney function tests show improvement, with creatinine levels decreasing from 1.04 to 0.91 and GFR  increasing from 59 to 69. The urine albumin and albumin creatinine ratio are within normal limits, indicating healthy kidney function. She is advised to continue monitoring her blood sugar levels and maintain her current diabetes management plan.  Continue metformin at current dosage  - Hemoglobin A1c; Future  - Comprehensive metabolic panel; Future  - Lipid Panel (Reflex to Direct  LDL if Triglycerides more than 400); Future    2. Essential hypertension  Blood pressure is well-controlled.  Follow low-salt diet and exercise 30 minutes most days of the week.  Continue verapamil and hydrochlorothiazide at current dosage.  Goal blood pressure 130/80 or less.  Labs reviewed with patient.  Reassess in 6 months    3. Hyperlipidemia, mixed  Her total cholesterol is slightly elevated at 220, with LDL at 115. The Framingham risk calculator indicates a 22 percent risk for heart attack and stroke, suggesting the need for cholesterol-lowering medication. She was advised to limit her intake of beef, chicken, and pork, and to increase her consumption of plant-based foods, vegetables, and fish. She was also cautioned about the increased risk of heart attack and stroke associated with high cholesterol levels. She prefers to manage her cholesterol through diet rather than medication and will work on dietary changes for the next four months.    4. Obesity (BMI 30-39.9)  Continue to work on weight loss  Author: Hetty Ely, MD  Note signed: 07/22/2023

## 2023-07-26 ENCOUNTER — Other Ambulatory Visit: Payer: Self-pay | Admitting: Primary Care

## 2023-07-26 ENCOUNTER — Encounter: Payer: Self-pay | Admitting: Primary Care

## 2023-08-01 ENCOUNTER — Other Ambulatory Visit: Payer: Self-pay | Admitting: Primary Care

## 2023-08-01 DIAGNOSIS — L989 Disorder of the skin and subcutaneous tissue, unspecified: Secondary | ICD-10-CM

## 2023-08-02 NOTE — Telephone Encounter (Signed)
Refill request    Medication:     Confirm that the patient wants the medication sent to:   [id number] - [place], [place] - [address] [facility] [address] [facility] [address] [place].  [address] [place] [address]  Phone: [phone] Fax: [phone]        Additional message:     Relevant labs:    Last Office Visit       Date Provider Department Visit Type Primary Dx    07/22/2023 Hetty Ely, MD [facility] Office Visit Type 2 diabetes mellitus            Last Telemedicine Visit       Date Provider Department Visit Type Primary Dx    12/29/2018 Hetty Ely, MD [facility] Telemedicine Visit Preventative health care          Last PA Office Visit    None          Next visit: Future Encounters      11/24/2023  1:40 PM Sch    FOLLOW UP VISIT    PRA    Hetty Ely, MD                         Additional information to be completed by clinical staff:  If the request is for a controlled substance please paste the iStop results below:      **If the refill protocol indicates they are due for labs please order them and tell the patient to have them drawn**

## 2023-08-16 ENCOUNTER — Ambulatory Visit: Payer: Medicare (Managed Care) | Admitting: Primary Care

## 2023-08-16 ENCOUNTER — Other Ambulatory Visit: Payer: Self-pay | Admitting: Primary Care

## 2023-08-16 ENCOUNTER — Encounter: Payer: Self-pay | Admitting: Primary Care

## 2023-08-16 ENCOUNTER — Telehealth: Payer: Self-pay | Admitting: Primary Care

## 2023-08-16 VITALS — BP 138/64 | HR 60 | Resp 16

## 2023-08-16 DIAGNOSIS — M25531 Pain in right wrist: Secondary | ICD-10-CM

## 2023-08-16 DIAGNOSIS — M79651 Pain in right thigh: Secondary | ICD-10-CM

## 2023-08-16 DIAGNOSIS — M25532 Pain in left wrist: Secondary | ICD-10-CM

## 2023-08-16 MED ORDER — NAPROXEN 500 MG PO TABS *I*
500.0000 mg | ORAL_TABLET | Freq: Two times a day (BID) | ORAL | 0 refills | Status: DC
Start: 2023-08-16 — End: 2023-09-30

## 2023-08-16 NOTE — Telephone Encounter (Signed)
Patient called    Right thigh pain.    Started last Tuesday and then would ease up, but still there.  Probably has been close to a year since last had this pain.    Tylenol is not working.    Patient looking for imaging or referral to find out what is going on.    Patient tried calling ortho, but no openings until 08/31/23.  Originally booked appt, but then cancelled it because so far out.

## 2023-08-16 NOTE — Progress Notes (Signed)
Went to dentist last Thursday and than to [employer] and was pushing a cart and got a pain in her right thigh. Just wont go away. Also pain in both wrist area

## 2023-08-16 NOTE — Progress Notes (Signed)
 [facility]     Subjective     Robin Green is a 67 y.o. female who presents for right thigh pain and Wrist Pain  History of Present Illness  The patient is a 67 year old female who presents for evaluation of right thigh pain and bilateral wrist pain.    She reports experiencing intermittent pain in her right thigh, which she describes as sharp. The onset of this pain was sudden, occurring last Tuesday while she was at [employer]. The pain was severe enough to cause her to limp, and she rates it as a 10 on a scale of 0 to 10. Currently, she rates the pain as a 7 or 8. The pain is constant and does not subside completely, necessitating the use of a cane for mobility. She does not experience any associated back pain, numbness, or tingling. She also reports no swelling in the affected area. She recalls a similar episode of pain around the same time last year, which was localized to her right thumb. She had a dental cleaning procedure on the same day the pain started but did not receive any numbing medication at that time. However, she did receive numbing medication during a subsequent visit to the dentist on Thursday. The pain is absent during sleep but intensifies upon standing. She has been managing the pain with over-the-counter Tylenol and Advil. She has previously sought physical therapy at [facility].    Additionally, she experiences sharp pain in her hands, particularly when lifting objects. She rates this pain as a 3 on a scale of 0 to 10. She does not report any associated numbness, tingling, or swelling.    MEDICATIONS  Current: Tylenol, Advil       Objective   Blood pressure 138/64, pulse 60, resp. rate 16, SpO2 97%, not currently breastfeeding.  Physical Exam  Pleasant lady in no distress  Thigh is normal to inspection. No reproducible tenderness  No decrease in strength.   Limping with walking   No restriction with Range of motion /pain with wrist movement    Results            Assessment &  Plan  1. Right thigh pain.  The etiology of the pain remains uncertain, but it appears to be muscular in nature. She reports the pain started suddenly while walking and has persisted, requiring the use of a cane. The pain is rated 7-8/10 currently, down from 10/10 initially. There is no associated back pain, numbness, or tingling. She has tried Tylenol and Advil for pain relief. A prescription for naproxen 500 mg, to be taken twice daily with meals for 5 days, will be provided. She is advised to continue using Tylenol for pain management. Heat application is recommended to relax the muscles, with caution to avoid skin burns. A referral for physical therapy will be made to address the right thigh pain. If there is no improvement with physical therapy, further testing may be considered.    2. Bilateral wrist pain.  The patient reports sharp pain in her hands, rated 3/10, especially when lifting objects. There is no associated numbness, tingling, or swelling. A referral for physical therapy will be made to address the bilateral wrist pain.    Follow-up  The patient will follow up in 6 weeks.                 Author: Hetty Ely, MD  Note signed: 08/16/2023

## 2023-08-16 NOTE — Telephone Encounter (Signed)
Pt does not know how to do VV

## 2023-08-16 NOTE — Telephone Encounter (Signed)
Called patient    Appt in place

## 2023-08-17 ENCOUNTER — Ambulatory Visit
Admission: RE | Admit: 2023-08-17 | Discharge: 2023-08-17 | Disposition: A | Discharge status: Home / Self Care | Payer: Medicare (Managed Care) | Source: Ambulatory Visit | Attending: Primary Care | Admitting: Primary Care

## 2023-08-17 ENCOUNTER — Other Ambulatory Visit: Payer: Self-pay

## 2023-08-17 ENCOUNTER — Telehealth: Payer: Self-pay | Admitting: Primary Care

## 2023-08-17 DIAGNOSIS — I70202 Unspecified atherosclerosis of native arteries of extremities, left leg: Secondary | ICD-10-CM | POA: Insufficient documentation

## 2023-08-17 DIAGNOSIS — M79651 Pain in right thigh: Secondary | ICD-10-CM | POA: Insufficient documentation

## 2023-08-17 NOTE — Telephone Encounter (Signed)
Robin Green called [facility])  [phone]    Patient has appt at [facility] today.    Regarding [place] order.    Needs clarification.    1- Exam requested for arteries.  Would like to confirm arteries and not the veins.    2- Indication is for right thigh pain, but bilateral imaging was ordered.

## 2023-08-17 NOTE — Telephone Encounter (Signed)
Spoke with [place] at [facility] and gave her the information that Dr Rennis Harding wrote in confirmation of testing   Verbalized understanding

## 2023-08-17 NOTE — Telephone Encounter (Signed)
Scheduled today 08/17/23 patient notified

## 2023-09-07 ENCOUNTER — Other Ambulatory Visit: Payer: Self-pay

## 2023-09-07 ENCOUNTER — Ambulatory Visit: Payer: Medicare (Managed Care) | Admitting: Foot & Ankle Surgery

## 2023-09-07 DIAGNOSIS — E119 Type 2 diabetes mellitus without complications: Secondary | ICD-10-CM

## 2023-09-07 NOTE — Progress Notes (Signed)
SUBJECTIVE:    Chief Complaint   Patient presents with    Left Foot - Follow-up, Diabetes    Right Foot - Follow-up, Diabetes     DFC  DM X 7-8 years, BG today AM= 120  Wearing boots  A1C: 6.6 on 07/19/23     68 year old patient presents to clinic for diabetic foot check.  Admits to being diabetic for 6 years.  She does not check her blood glucose regularly.  Last A1c was 6.6.  Denies any symptoms to her feet today.  Overall she is feeling great. Pt denies any F/C/N/V/SOB/CP. Pt denies any redness, drainage, pus, streaking, or other SOI.  No new complaints.  Last BG was 120 mg/dL.    PMH, PSH, Family, Social, Meds, see above  Allergy:   Allergies   Allergen Reactions    Nifedipine Swelling    Seafood Itching    Glipizide Other (See Comments)     Memory concerns and numbness    Lisinopril Other (See Comments)     Dizziness and cough         OBJECTIVE FINDINGS:     General: AOx3, NAD.  Gait is normal  Vascular: DP/PT 2/4 b/l, CFT <3 sec b/l  Neuro: Sensation intact with SW 5.07g monofilament b/l  Integument: Skin is warm, dry, supple.  Hair present to the level of the digits.  No discoloration of skin appreciated.  No preulcerative lesions appreciated.  No erythema, edema or signs of infection appreciated.  MSK: Motor fxn intact b/l. Muscle strength 5/5 b/l. Arch height is decreased.      HbA1c  Hemoglobin A1C   Date Value Ref Range Status   07/19/2023 6.6 (H) % Final     Comment:     Ref Range <=5.6  HbA1c values of 5.7-6.4% indicate an increased risk for developing  diabetes mellitus.  HbA1c values greater than or equal to 6.5% are diagnostic of  diabetes mellitus.  For diagnosis of diabetes in individuals without unequivocal  hyperglycemia, results should be confirmed by repeat testing.     03/16/2023 6.5 (H) % Final     Comment:     Ref Range <=5.6  HbA1c values of 5.7-6.4% indicate an increased risk for developing  diabetes mellitus.  HbA1c values greater than or equal to 6.5% are diagnostic of  diabetes  mellitus.  For diagnosis of diabetes in individuals without unequivocal  hyperglycemia, results should be confirmed by repeat testing.     11/27/2022 6.6 (H) % Final     Comment:     Ref Range <=5.6  HbA1c values of 5.7-6.4% indicate an increased risk for developing  diabetes mellitus.  HbA1c values greater than or equal to 6.5% are diagnostic of  diabetes mellitus.  For diagnosis of diabetes in individuals without unequivocal  hyperglycemia, results should be confirmed by repeat testing.     07/14/2022 6.6 (H) % Final     Comment:     Ref Range <=5.6  HbA1c values of 5.7-6.4% indicate an increased risk for developing  diabetes mellitus.  HbA1c values greater than or equal to 6.5% are diagnostic of  diabetes mellitus.  For diagnosis of diabetes in individuals without unequivocal  hyperglycemia, results should be confirmed by repeat testing.     03/06/2022 6.2 (H) % Final     Comment:     Ref Range <=5.6  HbA1c values of 5.7-6.4% indicate an increased risk for developing  diabetes mellitus.  HbA1c values greater than or equal to 6.5%  are diagnostic of  diabetes mellitus.  For diagnosis of diabetes in individuals without unequivocal  hyperglycemia, results should be confirmed by repeat testing.            DIAGNOSIS:    Encounter Diagnoses   Name Primary?    Type 2 diabetes mellitus without complication, without long-term current use of insulin Yes    Encounter for diabetic foot exam                             PLAN:     Pt seen and examined   Debrided all offending nails without incident.   Educated patient on daily foot checks and good foot hygiene.  Educated patient on neuropathic changes and pt to follow up sooner should  notice any of these problems.  Discussed need to keep blood sugars within normal limits   I personally reviewed HbA1c with the patient in great detail with most recent H A1c of 6.6  Detailed explanation of potential foot and ankle problems associated with diabetes  Patient has been afforded the  opportunity to ask questions. All questions answered. Patient verbalized understanding of all issues and instructions  F/U yearly for DM check       Answers submitted by the patient for this visit:  Left foot (Submitted on 09/07/2023)  Chief Complaint: Left foot pain  What is your goal for today's visit?: Diabetes  Was this the result of an injury?: No  What is your pain level?: none  What treatments have you tried for this condition?: no previous treatments  Is this a work related condition? : No  Current work status: : no work  Questionnaire about: Left foot pain (Submitted on 09/07/2023)  Chief Complaint: Left foot pain

## 2023-09-11 ENCOUNTER — Other Ambulatory Visit: Payer: Self-pay | Admitting: Primary Care

## 2023-09-13 NOTE — Telephone Encounter (Signed)
1 IN MM  LAST OV 08/16/23  NEXT OV 09/29/23  PLEASE ERX.

## 2023-09-14 ENCOUNTER — Ambulatory Visit: Payer: Medicare (Managed Care)

## 2023-09-14 ENCOUNTER — Other Ambulatory Visit: Payer: Self-pay | Admitting: Primary Care

## 2023-09-14 ENCOUNTER — Ambulatory Visit: Payer: Medicare (Managed Care) | Admitting: Rehabilitative and Restorative Service Providers"

## 2023-09-14 ENCOUNTER — Other Ambulatory Visit: Payer: Self-pay | Admitting: Oncology

## 2023-09-14 MED ORDER — ALPRAZOLAM 0.25 MG PO TABS *I*
0.2500 mg | ORAL_TABLET | Freq: Two times a day (BID) | ORAL | 0 refills | Status: DC | PRN
Start: 2023-09-14 — End: 2024-01-10

## 2023-09-14 NOTE — Progress Notes (Signed)
 Received call from answering service regarding script needed for xanax. Istop done. Script sent and pt to be notified.     Myna Hidalgo, NP

## 2023-09-14 NOTE — Progress Notes (Deleted)
 Department of Physical Medicine & Rehabilitation  Physical Therapy Initial Assessment    History/Subjective    Diagnosis:R thigh pain s/p lumbar fusion    Referring practitioner: Rennis Harding    Onset date of symptoms:  10/01/22    Mechanism of injury: chronic    Orthopedics 2/24: lumbar radiculopathy vs meralgia peresthetica    Work Status:  ***    Orthotics:  none  Weightbearing Status:  full    Symptoms Worsen With: ***    Symptoms Better With: ***    Communication/learning style:  verbal, written, demonstration      Participation restrictions:  work, school, community activities, driving, meal prep, cleaning and housework, shopping***    Orientation:  Alert and Oriented    Pain:   Today: ***/10   Best: Marland Kitchen/10   Worst: ***/10      Location: ***    Past Medical History:   Diagnosis Date    Anxiety     Diabetes     Diabetes mellitus     High blood pressure     Hypertension      Past Surgical History:   Procedure Laterality Date    BREAST BIOPSY  2008    CHOLECYSTECTOMY, LAPAROSCOPIC      JOINT REPLACEMENT      left TKR    KNEE REPLACEMENT  2008/2017    x2    LUMBAR FUSION      L4-5 decompression with instrumented fusion    PR HYSTEROSCOPY BX ENDOMETRIUM&/POLYPC W/WO D&C N/A 07/31/2020    Procedure: D & C HYSTEROSCOPY WITH MYOSURE, POLYPECTOMY;  Surgeon: Charyl Dancer, MD;  Location: [facility];  Service: OBGYN       Comorbidities affecting treatment/recovery:  Anxiety  Diabetes  B TKR  L4-5 lumbar fusion  Personal factors affecting treatment/recovery:  {desc; AMB personal factors affecting therapy:3390400437}    {PROMIS Questionnaires:42977}     Objective    Observation: ***    Palpation: ***    Sensation: ***    ROM Lumbar Spine % deficit   Flexion ***   Extension ***   Right Sidebending ***   Left Sidebending ***     Strength:    Left LE: ***  Right LE: ***    Functional Assessment:  Bed Mobility:  ***  Transfers:  ***              Gait: ***     Functional limitations: bed mobility, transfers, walking, stairs,  bathing, dressing, hygiene/grooming, bending, squatting, lifting, carrying, sleeping, standing, sitting***     Symptom changes during evaluation:  ***    Oswestry score:  ***%     SPECIAL TEST Lumbar Spine + / -   SLR ***   SLR with DF ***   90-90 HS ***   FABER ***   FADIR ***   *** ***   *** ***     Assessment:  68 y.o. y/o female with L4 lumbar radiculopathy.   Patient will benefit from skilled PT for core stabilization exercises, HEP instruction and management of symptoms to maximize functional status.      Rehab potential/prognosis: {GOOD/FAIR/POOR:24734}  Patient's understanding: {GOOD/FAIR/POOR:24734}      Clinical presentation:  {desc;clinical [phone]}    Patient complexity:    {Desc; low/moderate/high:110033} level as indicated by above stability of condition, personal factors, environmental factors and comorbidities in addition to their impairments found on physical exam.    Stable:  signs and symptoms remain localized to the body structure or function  of the primary diagnosis  Evolving:  presentation fluctuates:  pain fluctuations, changing symptoms with activities/evaluation/movements, weight bearing changes, vital sign changes  Unstable:  high complexity, changes in blood pressure, breathing, symptoms are worsening in days leading to evaluation    Plan  Plan of Care: appropriate for PT    PT interventions: flexibility, strengthening, heat, cold, general conditioning, pt education, posture/body mechanics, home exercise program    PT frequency:  {Weekly/Monthly:26529}    PT duration: {Visit/Weeks:26510}      Short term goals (4 weeks):  1. Demonstrates independence with basic HEP  2. Independent with home thermal agents  3. Oswestry <=***%  4. ***    Long term goals (*** weeks):  1. Independent with final HEP  2. Oswestry <=***%  3. ***  4. ***    Pt Goal:  ***    Thank you for the referral.  If you have any questions and/or concerns, please feel free to contact me at [phone]  [phone].      Erich Montane, PT    Department of Physical Medicine & Rehabilitation    PLAN OF CARE    Physician:  Hetty Ely, MD    I have reviewed your {initial eval/prog note:31926} and agree with the documented goals and Plan of Care      09/14/2023

## 2023-09-14 NOTE — Progress Notes (Deleted)
 Physical Medicine & Rehabilitation  Occupational Therapy  Evaluation      Name:  Robin Green  MRN:  [id number]  Diagnosis:  No diagnosis found.  Today's date:  09/14/2023    Occupational Profile    PMH:    Past Medical History:   Diagnosis Date    Anxiety     Diabetes     Diabetes mellitus     High blood pressure     Hypertension        PSH:     Past Surgical History:   Procedure Laterality Date    BREAST BIOPSY  2008    CHOLECYSTECTOMY, LAPAROSCOPIC      JOINT REPLACEMENT      left TKR    KNEE REPLACEMENT  2008/2017    x2    LUMBAR FUSION      L4-5 decompression with instrumented fusion    PR HYSTEROSCOPY BX ENDOMETRIUM&/POLYPC W/WO D&C N/A 07/31/2020    Procedure: D & C HYSTEROSCOPY WITH MYOSURE, POLYPECTOMY;  Surgeon: Charyl Dancer, MD;  Location: [facility];  Service: OBGYN     HPI: B wrist pain    Work status:  {Work Status:27575}    Home Set up:    Pain:  {NUMBERS 1-10:23402}, {RIGHT/LEFT:21326}, {ot ue:27507}    Promis Score:    Performance Deficits    ADL Status:  {PMR AMB NORMAL/IMPAIRED:21013704}    Instrumental ADL/Community status: {PMR AMB INSTRUMENTAL ACL/COMMUNITY VWUJWJ:19147829}    Transfers: {PMR AMB TRANSFER STATUS NORMAL/IMPAIRED:21013705}    Vision:  {Desc; intact/impaired:21013709}  Corrective lenses: {yes no:16019492::"No"}  Eye disease:  Childhood eye disease:       Perception:  {Desc; intact/impaired:21013710}    Cognition:  {Desc; intact/impaired:21013711}      Upper Extremity Assessment    Range of Motion: (unnoted implies WFL)  {PMR AMB UE FAO:13086578}          Strength: Manual Muscle Testing scores __/5 (unnoted implies WFL)   {PMR AMB Measured/Not Measured:21013702}      Grip/Pinch Assessment: {PMR AMB GRIP/PINCH ASSESSMENT:21013713}    Sensation: impaired     Right    Light touch {Desc; intact/impaired:30302}   Sharp/dull {Desc; intact/impaired:30302}   Deep pressure {Desc; intact/impaired:30302}   Hot/cold {Desc; intact/impaired:30302}   Proprioception {Desc;  intact/impaired:30302}   Kinethesia {Desc; intact/impaired:30302}       Left    Light touch {Desc; intact/impaired:30302}   Sharp/dull {Desc; intact/impaired:30302}   Deep pressure {Desc; intact/impaired:30302}   Hot/cold {Desc; intact/impaired:30302}   Proprioception {Desc; intact/impaired:30302}   Kinethesia {Desc; intact/impaired:30302}       Tone:     Hand function:     Skin Assessment/Appearance:     Edema:     Treatment Today:      Patient Education:  {patient Sports administrator, Written, Video, or Demonstration Learner  Patient, Family, or Caregiver Date Education Initiated Date Completed   Durable Medical Equipment Physical Therapy  Occupational Therapy       Activities of Daily Living Occupational Therapy       Bathroom Accessibility Occupational Therapy, Physical Therapy       Upper Extremity Orthotics Occupational Therapy       Independent Living / Chartered loss adjuster Work Copy Health Information Occupational Therapy        Cognition Speech Language Pathology  Occupational Therapy       Safety in  Environment  Occupational Therapy, Physical Therapy           Assessment:     Patient Goals:    Goals:     Plan: {ZOXW:96045}    Clearnce Sorrel, OT    Minutes:  Total time: ***     Timed mins: ***   Untimed: ***   Unbillable: ***      OT Evaluation Complexity    1.  Chart Review          A {Chart Review:28089} review of Patient's Occupational Profile & Medical History was performed.     2.  Occupational Performance          Assessment of patient occupations reveals deficits including:      a.  ADL - {ADL:28096}      b. IADL - {IADL:28097}      c. Rest/Sleep - {YES/NO:23318}      d. Education - {YES/NO:23318}      e. Work - {YES/NO:23318}      f. Play - {YES/NO:23318}      g. Leisure - {YES/NO:23318}      h. Social Participation - {YES/NO:23318}    3.  Decision Making           Analysis of data from {Analysis:28098}        A. {Modification Needed:28099} modification needed to complete evaluation.      B. Comorbidities {Misc; Will/Will Not:25093} affect recovery.      C. {Treatment:28102} treatment options exist.    4.  Evaluation Complexity is {Complexity:28103} based on the above.

## 2023-09-14 NOTE — Progress Notes (Signed)
 Physical Therapy Daily Flowsheet:  *Please see Physical Therapy Exercise Flowsheet for details regarding exercises completed this session.*     09/14/23 1000   Overview   Missed Visit Canceled     Erich Montane, PT

## 2023-09-15 NOTE — Telephone Encounter (Signed)
 Patient notified

## 2023-09-29 ENCOUNTER — Other Ambulatory Visit: Payer: Self-pay

## 2023-09-29 ENCOUNTER — Ambulatory Visit: Payer: Medicare (Managed Care) | Admitting: Primary Care

## 2023-09-29 ENCOUNTER — Encounter: Payer: Self-pay | Admitting: Primary Care

## 2023-09-29 VITALS — BP 124/66 | HR 67 | Resp 16

## 2023-09-29 DIAGNOSIS — M79651 Pain in right thigh: Secondary | ICD-10-CM

## 2023-09-29 NOTE — Progress Notes (Signed)
 [facility]     Subjective     Robin Green is a 67 y.o. female who presents for right thigh pain and Wrist Pain  History of Present Illness  The patient presents for a 6-week follow-up visit for thigh pain and wrist pain.    She has been unable to attend physical therapy due to a recent stomach virus. Despite this, she reports an improvement in her thigh pain, which she describes as intermittent but not severe. She quantifies her current pain level as 5 or 6 out of 10, a significant reduction from the previous constant pain level of 10 out of 10.       Objective   Blood pressure 124/66, pulse 67, resp. rate 16, SpO2 98%, not currently breastfeeding.  Physical Exam  Vital Signs  Blood pressure is normal.  Pleasant lady in no distress  HEENT normocephalic atraumatic  Mood and affect are normal    Results            Assessment & Plan  1. Pain in the thigh.  She has experienced a significant reduction in her pain levels, with a decrease from a previous intensity of 10 to a current level of 5 or 6 on a scale of 0-10. This improvement is noteworthy considering she has not yet initiated physical therapy. She is advised to commence physical therapy sessions. If there are any changes in her condition, she should promptly inform the clinic.    Follow-up  The patient will follow up in March 2025.                 Author: Hetty Ely, MD  Note signed: 09/29/2023

## 2023-11-06 ENCOUNTER — Other Ambulatory Visit: Payer: Self-pay | Admitting: Primary Care

## 2023-11-06 DIAGNOSIS — I10 Essential (primary) hypertension: Secondary | ICD-10-CM

## 2023-11-08 NOTE — Telephone Encounter (Signed)
 1 in mm Last Ov 09/29/23 Next appt 01/03/24 Please erx

## 2023-11-24 ENCOUNTER — Ambulatory Visit: Payer: Medicare (Managed Care) | Admitting: Primary Care

## 2023-12-02 ENCOUNTER — Other Ambulatory Visit: Payer: Self-pay | Admitting: Primary Care

## 2023-12-07 ENCOUNTER — Ambulatory Visit: Payer: Medicare (Managed Care) | Admitting: Foot & Ankle Surgery

## 2023-12-31 ENCOUNTER — Other Ambulatory Visit
Admission: RE | Admit: 2023-12-31 | Discharge: 2023-12-31 | Disposition: A | Discharge status: Home / Self Care | Payer: Medicare (Managed Care) | Source: Ambulatory Visit | Attending: Primary Care | Admitting: Primary Care

## 2023-12-31 DIAGNOSIS — E119 Type 2 diabetes mellitus without complications: Secondary | ICD-10-CM | POA: Insufficient documentation

## 2023-12-31 LAB — COMPREHENSIVE METABOLIC PANEL
ALT: 23 U/L (ref 0–35)
AST: 22 U/L (ref 0–35)
Albumin: 4 g/dL (ref 3.5–5.2)
Alk Phos: 57 U/L (ref 35–105)
Anion Gap: 11 (ref 7–16)
Bilirubin,Total: 0.5 mg/dL (ref 0.0–1.2)
CO2: 27 mmol/L (ref 20–28)
Calcium: 9.6 mg/dL (ref 8.6–10.2)
Chloride: 101 mmol/L (ref 96–108)
Creatinine: 0.93 mg/dL (ref 0.51–0.95)
Glucose: 91 mg/dL (ref 60–99)
Lab: 11 mg/dL (ref 6–20)
Potassium: 4.1 mmol/L (ref 3.3–5.1)
Sodium: 139 mmol/L (ref 133–145)
Total Protein: 7 g/dL (ref 6.3–7.7)
eGFR BY CREAT: 67 *

## 2023-12-31 LAB — LIPID PANEL
Chol/HDL Ratio: 2.4
Cholesterol: 205 mg/dL — AB
HDL: 85 mg/dL — ABNORMAL HIGH (ref 40–60)
LDL Calculated: 111 mg/dL
Non HDL Cholesterol: 120 mg/dL
Triglycerides: 47 mg/dL

## 2024-01-02 LAB — HEMOGLOBIN A1C: Hemoglobin A1C: 6.6 % — ABNORMAL HIGH

## 2024-01-03 ENCOUNTER — Other Ambulatory Visit: Payer: Self-pay

## 2024-01-03 ENCOUNTER — Ambulatory Visit: Payer: Medicare (Managed Care) | Admitting: Primary Care

## 2024-01-03 ENCOUNTER — Encounter: Payer: Self-pay | Admitting: Primary Care

## 2024-01-03 VITALS — BP 146/74 | HR 65 | Resp 16 | Ht 64.0 in | Wt 188.0 lb

## 2024-01-03 DIAGNOSIS — E669 Obesity, unspecified: Secondary | ICD-10-CM

## 2024-01-03 DIAGNOSIS — L723 Sebaceous cyst: Secondary | ICD-10-CM

## 2024-01-03 DIAGNOSIS — E782 Mixed hyperlipidemia: Secondary | ICD-10-CM

## 2024-01-03 DIAGNOSIS — I10 Essential (primary) hypertension: Secondary | ICD-10-CM

## 2024-01-03 DIAGNOSIS — E119 Type 2 diabetes mellitus without complications: Secondary | ICD-10-CM

## 2024-01-03 NOTE — Patient Instructions (Signed)
 Recommend monitoring Bps.  Check once a day either first thing in the morning or last thing at night before bed. Goal BP of 130/80 or less. Bring in log of Bps for my review.

## 2024-01-03 NOTE — Progress Notes (Signed)
 [facility]     HISTORY OF PRESENT ILLNESS     Robin Green is a 68 y.o. female who presents for Diabetes, Lab Results, and Foot Pain    History of Present Illness  The patient presents for a follow-up of diabetes, hypertension, and hyperlipidemia.    She has been inconsistent in her metformin  regimen, taking it intermittently. Recent blood work showed her blood sugar level at 6.6. Her liver, kidney, and electrolyte levels were normal. Cholesterol levels showed a total cholesterol of 205, triglycerides at 47, HDL at 85, and LDL at 111. She has previously declined the use of cholesterol-lowering medications.    She has not been monitoring her blood pressure at home, despite having a cuff available. She admits to a high salt intake in her diet. She reports no respiratory symptoms such as coughing or wheezing, and no chest pain. She is on hydrochlorothiazide  and verapamil  240 mg twice daily for her blood pressure.    In April 2025, she had an ultrasound and a mammogram which revealed a dermal-based mass at 4:00, initially thought to be an infected sebaceous cyst. She was prescribed a 10-day course of Bactrim antibiotic. A follow-up visit on 12/16/2023 showed that the infected sebaceous cyst was resolving. It was noted that if the area did not completely heal over the next 3 to 4 weeks, she would be referred to a dermatologist. She has been applying hydrocortisone to the area, which causes a slight tingling sensation.    FAMILY HISTORY  No updates to family history.     Interim history reviewed    Review of Systems   Respiratory:  Negative for cough, shortness of breath and wheezing.    Cardiovascular:  Negative for chest pain and palpitations.       MEDICATIONS     Current Outpatient Medications   Medication Sig    hydroCHLOROthiazide  12.5 MG tablet TAKE 2 TABLETS BY MOUTH DAILY.    Bioflavonoid Products (ESTER C PO) Take by mouth.    metFORMIN  (GLUCOPHAGE ) 500 mg tablet TAKE 1 TABLET BY MOUTH TWICE A DAY  WITH FOOD    verapamil  (CALAN -SR) 240 mg CR tablet Take 1 tablet (240 mg total) by mouth 2 times daily. Swallow whole. Do not crush, break, or chew.    ONETOUCH VERIO test strip TEST USING 1 STRIP 3 TIMES DAILY    blood glucose monitor device By no specified route daily  Use to test blood sugar as directed    blood pressure monitor By no specified route daily  Check Bps 2-3 times/week    Cinnamon 500 MG capsule Take 500 mg by mouth daily    UNABLE TO FIND Med Name:Osteo Biflex    lancets Brand Verio Flex; Use three  times per day as directed for blood glucose testing.    Zinc Sulfate (ZINC-220 PO) Take by mouth    Potassium 99 MG TABS Take by mouth     TURMERIC PO Take 1 tablet by mouth daily    Non-System Medication BP cuff, large Dx: HTN    cholecalciferol (VITAMIN D) 1000 UNIT tablet Take 2 tablets (2,000 units total) by mouth daily.    ALPRAZolam  (XANAX ) 0.25 mg tablet Take 1 tablet (0.25 mg total) by mouth 2 times daily as needed for Anxiety. Max daily dose: 0.5 mg    nystatin  (MYCOSTATIN ) ointment APPLY 1,000,000 G TOPICALLY 2 TIMES DAILY TO THE FOLLOWING AREAS: UNDER BREAST       Patient's medications were reviewed and updated  at today's visit and no changes were made    ALLERGIES     Nifedipine , Seafood, Glipizide , and Lisinopril      Patient's allergies were reviewed and updated at today's visit and no changes were made    PROBLEM LIST     Patient Active Problem List   Diagnosis Code    Sudden hearing loss H91.20    Obesity (BMI 30-39.9) E66.9    Type 2 diabetes mellitus E11.9    Carpal tunnel syndrome G56.00    HTN (hypertension) I10    Osteoarthritis, knee M17.9    Presence of right artificial knee joint Z96.651    Hyperlipidemia, mixed E78.2    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   Constitutional:       Comments: Pleasant lady in no distress   HENT:      Head: Normocephalic and atraumatic.      Right Ear: External ear normal.      Left Ear: External ear normal.      Nose: Nose normal.   Cardiovascular:      Rate and Rhythm: Normal rate and regular rhythm.      Heart sounds: Normal heart sounds.   Pulmonary:      Effort: Pulmonary effort is normal. No respiratory distress.      Breath sounds: Normal breath sounds.   Neurological:      Mental Status: She is alert.   Psychiatric:         Mood and Affect: Mood normal.           RECENT LABS     Recent Results (from the past 2 weeks)   Lipid Panel (Reflex to Direct  LDL if Triglycerides more than 400)    Collection Time: 12/31/23  3:31 PM   Result Value Ref Range    Cholesterol 205 (!) mg/dL    Triglycerides 47 mg/dL    HDL 85 (H) 40 - 60 mg/dL    LDL Calculated 888 mg/dL    Non HDL Cholesterol 120 mg/dL    Chol/HDL Ratio 2.4    Comprehensive metabolic panel    Collection Time: 12/31/23  3:31 PM   Result Value Ref Range    Sodium 139 133 - 145 mmol/L    Potassium 4.1 3.3 - 5.1 mmol/L    Chloride 101 96 - 108 mmol/L    CO2 27 20 - 28 mmol/L    Anion Gap 11 7 - 16    UN 11 6 - 20 mg/dL    Creatinine 9.06 9.48 - 0.95 mg/dL    eGFR BY CREAT 67 *    Glucose 91 60 - 99 mg/dL    Calcium  9.6 8.6 - 10.2 mg/dL    Total Protein 7.0 6.3 -  7.7 g/dL    Albumin 4.0 3.5 - 5.2 g/dL    Bilirubin,Total 0.5 0.0 - 1.2 mg/dL    AST 22 0 - 35 U/L    ALT 23 0 - 35 U/L    Alk Phos 57 35 - 105 U/L   Hemoglobin A1c    Collection Time: 12/31/23  3:31 PM   Result Value Ref Range    Hemoglobin A1C 6.6 (H) %            ASSESSMENT/PLAN     1. Type 2 diabetes mellitus (Primary)  Follow a  low carb diet and continue to work on wt loss. Continue metformin  as prescribed. Declines statins. Labs reviewed with patient     2. Essential hypertension  Bp is well controlled. Follow a low salt diet and continue to work on wt loss. Goal BP of 130/80 or less. Labs reviewed with patient. Not at goal. Patient does not want medcations adjusted yet. Will return in 1 month for follow up. Patient will monitor BP a few times a week.     3. Hyperlipidemia, mixed  The 10-year ASCVD risk score (Arnett DK, et al., 2019) is: 30.6%    Values used to calculate the score:      Age: 28 years      Sex: Female      Is Non-Hispanic African American: Yes      Diabetic: Yes      Tobacco smoker: No      Systolic Blood Pressure: 146 mmHg      Is BP treated: Yes      HDL Cholesterol: 85 mg/dL      Total Cholesterol: 205 mg/dL   Reviewed that she is at high risk for heart attack and stroke. Patient declines statins, aware of risk for MI and stroke.     4. Obesity (BMI 30-39.9)  Continue to work on wt loss    5. Infected sebaceous cyst  Healed. Patient reassured. Reviewed keeping the area clean      Assessment & Plan        Follow-up  - Follow-up scheduled in 1 month.      Follow up in about 4 weeks (around 01/31/2024) for Follow-up, HTN.    --Patient instructed to call if symptoms are not improving or worsening  --Follow-up arranged    Signed: GAILEN LEVER, MD on 01/05/2024 at 8:52 PM         [1]   Social History  Tobacco Use   Smoking Status Never   Smokeless Tobacco Never

## 2024-01-07 ENCOUNTER — Other Ambulatory Visit: Payer: Self-pay | Admitting: Oncology

## 2024-01-10 NOTE — Telephone Encounter (Signed)
 Last ov 01/03/24  Next ov 02/18/24      A N N B 09/14/2023 09/15/2023 alprazolam  0.25 mg tablet 20 10 Maximino Spanner [id number] [facility] [phone]

## 2024-01-16 ENCOUNTER — Other Ambulatory Visit: Payer: Self-pay | Admitting: Primary Care

## 2024-01-17 NOTE — Telephone Encounter (Signed)
 1 in mm last Ov 01/03/24 Next appt 02/18/24 Please erx

## 2024-01-18 ENCOUNTER — Other Ambulatory Visit: Payer: Self-pay

## 2024-01-19 ENCOUNTER — Encounter: Payer: Self-pay | Admitting: Optometry

## 2024-01-19 ENCOUNTER — Other Ambulatory Visit: Payer: Self-pay

## 2024-01-19 ENCOUNTER — Ambulatory Visit: Payer: Medicare (Managed Care) | Attending: Optometry | Admitting: Optometry

## 2024-01-19 DIAGNOSIS — H52203 Unspecified astigmatism, bilateral: Secondary | ICD-10-CM | POA: Insufficient documentation

## 2024-01-19 DIAGNOSIS — H40003 Preglaucoma, unspecified, bilateral: Secondary | ICD-10-CM | POA: Insufficient documentation

## 2024-01-19 DIAGNOSIS — H524 Presbyopia: Secondary | ICD-10-CM | POA: Insufficient documentation

## 2024-01-19 DIAGNOSIS — E119 Type 2 diabetes mellitus without complications: Secondary | ICD-10-CM | POA: Insufficient documentation

## 2024-01-19 DIAGNOSIS — H52223 Regular astigmatism, bilateral: Secondary | ICD-10-CM

## 2024-01-19 DIAGNOSIS — H5203 Hypermetropia, bilateral: Secondary | ICD-10-CM | POA: Insufficient documentation

## 2024-01-19 NOTE — Progress Notes (Signed)
 Outpatient Visit      Patient name: Robin Green  DOB: December 20, 1955       Age: 68 y.o.  MR#: [id number]    Encounter Date: 01/19/2024    Subjective:        Chief Complaint   Patient presents with    Blurred Vision     Doing well with current glasses     HPI     Blurred Vision     Additional comments: Doing well with current glasses           Comments    Robin Green is a 68 y.o. female in for a 1 year follow up visit.  Patient states vision has been  stable since last appointment. She reports     last glasses Rx given has been working out good for her. Denies eye pain,   flashes, floaters, itching/burning/tearing.    Ocular meds: None    Lab Results       Component                Value               Date                       HA1C                     6.6 (H)             12/31/2023            FBS: Has not checked today                 Last edited by Erlinda Haws, OD on 01/19/2024 10:27 AM.        has a current medication list which includes the following prescription(s): verapamil , alprazolam , hydrochlorothiazide , bioflavonoid, metformin , nystatin , onetouch verio, blood glucose monitor, blood pressure monitor, cinnamon, UNABLE TO FIND, lancets, zinc sulfate, potassium, turmeric, non-system medication, and cholecalciferol.     is allergic to nifedipine , seafood, glipizide , and lisinopril.      Past Medical History:   Diagnosis Date    Anxiety     Diabetes     Diabetes mellitus     Diabetes mellitus, type 2     High blood pressure     Hypertension     Rheumatoid arthritis       Past Surgical History:   Procedure Laterality Date    BACK SURGERY  09/13/18    BREAST BIOPSY  2008    CHOLECYSTECTOMY      CHOLECYSTECTOMY, LAPAROSCOPIC      JOINT REPLACEMENT      left TKR    KNEE REPLACEMENT  2008/2017    x2    KNEE SURGERY      LUMBAR FUSION      L4-5 decompression with instrumented fusion    PR HYSTEROSCOPY BX ENDOMETRIUM&/POLYPC W/WO D&C N/A 07/31/2020    Procedure: D & C HYSTEROSCOPY WITH MYOSURE, POLYPECTOMY;   Surgeon: Lorrain Rosenthal, MD;  Location: [facility];  Service: OBGYN        Specialty Problems          Ophthalmology Problems    Type 2 diabetes mellitus            ROS    Positive for: Endocrine, Eyes  Negative for: Constitutional, Gastrointestinal, Neurological, Skin,   Genitourinary, Musculoskeletal, HENT, Cardiovascular, Respiratory,   Psychiatric, Allergic/Imm, Heme/Lymph  Last  edited by Kelsey Patricia on 01/19/2024  9:13 AM.         Objective:     Base Eye Exam       Visual Acuity (Snellen - Linear)         Right Left    Dist cc 20/40 -2 20/20 -1    Near cc J1 OU      Correction: Glasses              Tonometry (Tonopen, 9:47 AM)         Right Left    Pressure 23 20              Pupils         Dark Light Shape React APD    Right 5 4 Round Slow None    Left 5 4 Round Slow None              Visual Fields         Left Right     Full Full              Extraocular Movement         Right Left     Full Full              Neuro/Psych       Oriented x3: Yes    Mood/Affect: Normal              Dilation       Both eyes: 2.5% Phenylephrine, 1.0% Tropicamide, 0.5% Proparacaine @ 9:48 AM                  Slit Lamp and Fundus Exam       External Exam         Right Left    External Normal ocular adnexae, lacrimal gland & drainage, orbits Normal ocular adnexae, lacrimal gland & drainage, orbits              Slit Lamp Exam         Right Left    Lids/Lashes Normal structure & position Normal structure & position    Conjunctiva/Sclera Normal bulbar/palpebral, conjunctiva, sclera Normal bulbar/palpebral, conjunctiva, sclera    Cornea Normal epithelium, stroma, endothelium, tear film Normal epithelium, stroma, endothelium, tear film    Anterior Chamber Clear & deep Clear & deep    Iris Normal shape, size, morphology Normal shape, size, morphology    Lens 1-2+ Nuclear sclerosis, 2+ Cortical cataract 1-2+ Nuclear sclerosis, 2+ Cortical cataract    Anterior Vitreous Posterior vitreous detachment Clear              Fundus Exam          Right Left    Disc Normal size, appearance, nerve fiber layer Normal size, appearance, nerve fiber layer    C/D Ratio 0.3 0.3    Macula Normal, (-)CME Normal, (-)CME    Vessels rare crossing change, no DR Normal, (-)hemes, cws, exudates, irma, vb, NVE    Periphery Normal Normal                  Refraction       Wearing Rx         Sphere Cylinder Axis Add    Right +2.25 -0.75 085 +2.50    Left +2.25 -1.00 080 +2.50      Age: less than a 40yr    Type: Bifocal  Manifest Refraction (Subjective)         Sphere Cylinder Axis Dist VA Add [place]    Right +2.25 -1.00 085 20/20 +2.75 20/20    Left +2.25 -1.00 080 20/20 +2.75 OU              Final Rx         Sphere Cylinder Axis Dist VA Add [place]    Right +2.25 -1.00 085 20/20 +2.75 20/20    Left +2.25 -1.00 080 20/20 +2.75 OU      Type: Bifocal    Expiration Date: 01/18/2026                  Final Rx         Sphere Cylinder Axis Dist VA Add [place]    Right +2.25 -1.00 085 20/20 +2.75 20/20    Left +2.25 -1.00 080 20/20 +2.75 OU      Type: Bifocal    Expiration Date: 01/18/2026                  No annotated images are attached to the encounter.      Assessment/Plan:      1. Hyperopia with astigmatism and presbyopia, bilateral        2. Glaucoma suspect of both eyes  OCT, RNFL-OU    OCT, RNFL-OU      3. Diabetes mellitus type 2 without retinopathy              PLAN:  New rx dispensed.   RNFL  OD SS: 7/10, Average RNFL 87, enlarged disc, Average GCL + IPL 79  OS SS: 8/10, Average RNFL 95, enlarged disc, Average GCL + IPL 77    Stable clinical findings. Borderline IOP today/slightly elevated. Check in 1 year with repeat RNFL OCT OU.    3. Pt educated on findings and on the importance of maintaining good blood sugar control and follow up with PCP. Monitor with annual dilated exam.

## 2024-02-10 NOTE — Progress Notes (Signed)
 Pre-Visit Planning    HTN DUE    Completed on 02/10/24 by Jonnette Nuon

## 2024-02-18 ENCOUNTER — Ambulatory Visit: Payer: Medicare (Managed Care) | Admitting: Primary Care

## 2024-02-18 ENCOUNTER — Encounter: Payer: Self-pay | Admitting: Primary Care

## 2024-02-18 VITALS — BP 140/76 | HR 65 | Resp 16

## 2024-02-18 DIAGNOSIS — E782 Mixed hyperlipidemia: Secondary | ICD-10-CM

## 2024-02-18 DIAGNOSIS — I10 Essential (primary) hypertension: Secondary | ICD-10-CM

## 2024-02-18 NOTE — Patient Instructions (Signed)
 Please bring your BP cuff

## 2024-02-18 NOTE — Progress Notes (Unsigned)
 [facility]     HISTORY OF PRESENT ILLNESS     Robin Green is a 68 y.o. female who presents for Hypertension and Follow-up    History of Present Illness  The patient is a 68 year old female who presents for a follow-up visit.    She has been monitoring her blood pressure at home, with readings taken yesterday at 8:46 PM and 8:50 PM, which were 114/62 and 103/56 respectively. Her heart rate was recorded as 57 and 55 during these measurements. She reports feeling well overall. She is currently on a regimen of hydrochlorothiazide , taking two tablets daily, and verapamil  for blood pressure management.    She has declined the use of statin medication for her elevated cholesterol levels.       Interim history reviewed      Review of Systems   Respiratory:  Negative for cough, shortness of breath and wheezing.    Cardiovascular:  Negative for chest pain and palpitations.       MEDICATIONS     Current Outpatient Medications   Medication Sig    hydroCHLOROthiazide  12.5 MG tablet TAKE 2 TABLETS BY MOUTH DAILY.    Bioflavonoid Products (ESTER C PO) Take by mouth.    metFORMIN  (GLUCOPHAGE ) 500 mg tablet TAKE 1 TABLET BY MOUTH TWICE A DAY WITH FOOD    ONETOUCH VERIO test strip TEST USING 1 STRIP 3 TIMES DAILY    blood glucose monitor device By no specified route daily  Use to test blood sugar as directed    blood pressure monitor By no specified route daily  Check Bps 2-3 times/week    Cinnamon 500 MG capsule Take 500 mg by mouth daily    UNABLE TO FIND Med Name:Osteo Biflex    lancets Brand Verio Flex; Use three  times per day as directed for blood glucose testing.    Zinc Sulfate (ZINC-220 PO) Take by mouth    Potassium 99 MG TABS Take by mouth     TURMERIC PO Take 1 tablet by mouth daily    Non-System Medication BP cuff, large Dx: HTN    cholecalciferol (VITAMIN D) 1000 UNIT tablet Take 2 tablets (2,000 units total) by mouth daily.    verapamil  (CALAN -SR) 240 mg CR tablet TAKE 1 TABLET (240 MG TOTAL) BY MOUTH 2  TIMES DAILY. SWALLOW WHOLE. DO NOT CRUSH, BREAK, OR CHEW.    ALPRAZolam  (XANAX ) 0.25 mg tablet TAKE 1 TABLET (0.25 MG TOTAL) BY MOUTH 2 TIMES DAILY AS NEEDED FOR ANXIETY. MAX DAILY DOSE: 0.5 MG    nystatin  (MYCOSTATIN ) ointment APPLY 1,000,000 G TOPICALLY 2 TIMES DAILY TO THE FOLLOWING AREAS: UNDER BREAST       Patient's medications were reviewed and updated at today's visit and no changes were made    ALLERGIES     Nifedipine , Seafood, Glipizide , and Lisinopril      Patient's allergies were reviewed and updated at today's visit and no changes were made    PROBLEM LIST     Patient Active Problem List   Diagnosis Code    Sudden hearing loss H91.20    Obesity (BMI 30-39.9) E66.9    Type 2 diabetes mellitus E11.9    Carpal tunnel syndrome G56.00    HTN (hypertension) I10    Osteoarthritis, knee M17.9    Presence of right artificial knee joint Z96.651    Hyperlipidemia, mixed E78.2    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   Constitutional:       Comments: Pleasant lady in no distress   HENT:      Head: Normocephalic and atraumatic.      Right Ear: External ear normal.      Left Ear: External ear normal.      Nose: Nose normal.   Cardiovascular:      Rate and Rhythm: Normal rate and regular rhythm.      Heart sounds: Normal heart sounds.   Pulmonary:      Effort: Pulmonary effort is normal. No respiratory distress.      Breath sounds: Normal breath sounds.   Neurological:      Mental Status: She is alert.   Psychiatric:         Mood and Affect: Mood normal.            RECENT LABS   No results found for this or any previous visit (from the past 2 weeks).         ASSESSMENT/PLAN     Assessment & Plan  1. Hypertension.  - Blood pressure readings have shown significant improvement, with most systolic values in the 120s and some in the 130s. Diastolic values are consistently <90.  - Heart rate is within normal limits.  - She is currently taking hydrochlorothiazide  2 tablets a day and verapamil .  - Advised to bring her blood pressure cuff to the next visit for calibration against the clinic's equipment.    2. Hypercholesterolemia.  - Previously decided not to start statin therapy despite high cholesterol levels and an elevated risk profile.  - Importance of managing cholesterol levels was discussed.  - Remains hesitant about medication.  - No new medications prescribed.  Aware of increased risk for heart attack and stroke    Follow-up  - Follow-up next month for a physical exam.       No follow-ups on file.    --Patient instructed to call if symptoms are not improving or worsening  --Follow-up arranged    Signed: GAILEN LEVER, MD on 02/18/2024 at 1:34 PM         [1]   Social History  Tobacco Use   Smoking Status Never   Smokeless Tobacco Never

## 2024-03-10 NOTE — Progress Notes (Signed)
 Pre-Visit Planning    HTN due    Completed on 03/10/24 by Lydiana Milley

## 2024-03-16 ENCOUNTER — Other Ambulatory Visit: Payer: Self-pay

## 2024-03-17 ENCOUNTER — Ambulatory Visit: Payer: Medicare (Managed Care) | Admitting: Primary Care

## 2024-03-17 ENCOUNTER — Encounter: Payer: Self-pay | Admitting: Primary Care

## 2024-03-17 VITALS — BP 124/64 | HR 76 | Resp 16 | Ht 64.0 in | Wt 186.0 lb

## 2024-03-17 DIAGNOSIS — I10 Essential (primary) hypertension: Secondary | ICD-10-CM

## 2024-03-17 DIAGNOSIS — Z0000 Encounter for general adult medical examination without abnormal findings: Secondary | ICD-10-CM

## 2024-03-17 DIAGNOSIS — E782 Mixed hyperlipidemia: Secondary | ICD-10-CM

## 2024-03-17 DIAGNOSIS — E669 Obesity, unspecified: Secondary | ICD-10-CM

## 2024-03-17 DIAGNOSIS — E119 Type 2 diabetes mellitus without complications: Secondary | ICD-10-CM

## 2024-03-17 MED ORDER — BLOOD PRESSURE MONITOR DEVICE *A*: Freq: Every day | 0 refills | Status: DC

## 2024-03-17 NOTE — Progress Notes (Unsigned)
 Visit performed as:            Office Visit, met with patient in person    Today we reviewed and updated Robin Green's smoking status, activities of daily living, depression screen, fall risk, medications and allergies.   I have counseled the patient in the above areas.     Subjective:     Chief Complaint: Robin Green is a 68 y.o. female here for a/an Annual Exam and Subsequent Annual Medicare Visit    In general, Robin Green rates their overall health as:  good      Patient Care Team:  Alto Flank, MD as PCP - General (Primary Care)  Ansel Maus, AUD as PCP - CCP-AUDIOLOGY  Kimberlee Bare, MD (Gastroenterology)  Theophilus Garland Notch, DPM (Podiatry)  Dasilva, Harlene Ruth, OD (Ophthalmology)     Current Outpatient Medications on File Prior to Visit   Medication Sig Dispense Refill    verapamil  (CALAN -SR) 240 mg CR tablet TAKE 1 TABLET (240 MG TOTAL) BY MOUTH 2 TIMES DAILY. SWALLOW WHOLE. DO NOT CRUSH, BREAK, OR CHEW. 180 tablet 1    hydroCHLOROthiazide  12.5 MG tablet TAKE 2 TABLETS BY MOUTH DAILY. 180 tablet 1    Bioflavonoid Products (ESTER C PO) Take by mouth.      metFORMIN  (GLUCOPHAGE ) 500 mg tablet TAKE 1 TABLET BY MOUTH TWICE A DAY WITH FOOD 180 tablet 1    ONETOUCH VERIO test strip TEST USING 1 STRIP 3 TIMES DAILY 100 each 5    blood glucose monitor device By no specified route daily  Use to test blood sugar as directed 1 each 0    Cinnamon 500 MG capsule Take 500 mg by mouth daily      UNABLE TO FIND Med Name:Osteo Biflex      lancets Brand Verio Flex; Use three  times per day as directed for blood glucose testing. 100 each 5    Zinc Sulfate (ZINC-220 PO) Take by mouth      Potassium 99 MG TABS Take by mouth       TURMERIC PO Take 1 tablet by mouth daily      Non-System Medication BP cuff, large Dx: HTN      cholecalciferol (VITAMIN D) 1000 UNIT tablet Take 2 tablets (2,000 units total) by mouth daily.      ALPRAZolam  (XANAX ) 0.25 mg tablet TAKE 1 TABLET (0.25 MG TOTAL) BY MOUTH 2 TIMES DAILY  AS NEEDED FOR ANXIETY. MAX DAILY DOSE: 0.5 MG 20 tablet 0    nystatin  (MYCOSTATIN ) ointment APPLY 1,000,000 G TOPICALLY 2 TIMES DAILY TO THE FOLLOWING AREAS: UNDER BREAST 15 g 3     No current facility-administered medications on file prior to visit.     Allergies   Allergen Reactions    Nifedipine  Swelling    Seafood Itching    Glipizide  Other (See Comments)     Memory concerns and numbness    Lisinopril Other (See Comments)     Dizziness and cough      Patient Active Problem List    Diagnosis Date Noted    Incisional hernia 12/29/2022     supraumbilical      Right thigh pain 09/25/2022    History of total knee replacement, right 09/25/2022    Thickened endometrium 07/31/2020    Status post hysteroscopy, polypectomy, D&C 07/31/2020    Acute pain of left shoulder 02/20/2020    Lumbar spondylosis 09/29/2018    Chronic left-sided low back pain with left-sided sciatica 12/16/2017  Hyperlipidemia, mixed 03/17/2017    Sudden hearing loss 02/25/2017    Obesity (BMI 30-39.9) 02/25/2017    Type 2 diabetes mellitus 02/25/2017    Carpal tunnel syndrome 02/25/2017    HTN (hypertension) 02/25/2017    Osteoarthritis, knee 02/25/2017    Presence of right artificial knee joint 12/25/2015     Past Medical History:   Diagnosis Date    Anxiety     Diabetes     Diabetes mellitus     Diabetes mellitus, type 2     High blood pressure     Hypertension     Rheumatoid arthritis      Past Surgical History:   Procedure Laterality Date    BACK SURGERY  09/13/18    BREAST BIOPSY  2008    CHOLECYSTECTOMY      CHOLECYSTECTOMY, LAPAROSCOPIC      JOINT REPLACEMENT      left TKR    KNEE REPLACEMENT  2008/2017    x2    KNEE SURGERY      LUMBAR FUSION      L4-5 decompression with instrumented fusion    PR HYSTEROSCOPY BX ENDOMETRIUM&/POLYPC W/WO D&C N/A 07/31/2020    Procedure: D & C HYSTEROSCOPY WITH MYOSURE, POLYPECTOMY;  Surgeon: Gunnar Raymona Louder, MD;  Location: [facility];  Service: OBGYN     Family History   Problem Relation Name Age of  Onset    Breast cancer Mother      Prostate cancer Father Gale Essex     Dementia Father Gale Essex     Diabetes Sister Cy Daring     Hypertension Sister Cy Daring     Cataracts Maternal Aunt      Diabetes Maternal Uncle      Coronary artery disease Maternal Uncle      Glaucoma Maternal Uncle      Amblyopia ([place] Eye) Neg Hx      Blindness Neg Hx      Heart Disease Neg Hx      Macular degeneration Neg Hx      Retinal detachment Neg Hx      Strabismus Neg Hx      Anesthesia problems Neg Hx      Clotting disorder Neg Hx       Social History     Socioeconomic History    Marital status: Widowed   Occupational History    Occupation: retired   Tobacco Use    Smoking status: Never    Smokeless tobacco: Never   Substance and Sexual Activity    Alcohol use: No    Drug use: No    Sexual activity: Not Currently   Social History Narrative    Lives at home with daughter, Vernell       Objective:     Vital Signs: BP 124/64   Pulse 76   Resp 16   Ht 1.626 m (5' 4)   Wt 84.4 kg (186 lb)   SpO2 98%   Breastfeeding No   BMI 31.93 kg/m    BMI: Body mass index is 31.93 kg/m.    Vision Screening Results (Welcome visit only):  No results found.    Depression Screening Results:  Review Flowsheet  More data exists         03/17/2024 03/18/2023 03/09/2022 03/17/2021 01/31/2020 12/29/2019 12/29/2018   PHQ Scores   PSQ2 Q1 - Interest/Pleasure - - - - N N N   PSQ2 Q2 - Down, Depressed, Hopeless - - - -  N N N   PHQ Calculated Score 0 0 0 0 - - -     No questionnaires on file.   Opioid Use/DAST- 10 Screening Results:   How many times in the past year have you used an illegal drug or used a prescription medication for nonmedical reasons?: 0 (03/17/2024  1:06 PM)    Activities of Daily Living/Functional Screening Results:  Is the person deaf or does he/she have serious difficulty hearing?: N (03/17/2024  1:04 PM)  Is this person blind or does he/she have serious difficulty seeing even when wearing glasses?: N (03/17/2024  1:04 PM)  *Vision  Status: Visual aid  (03/17/2024  1:04 PM)  Does this person have serious difficulty walking or climbing stairs?: N (03/17/2024  1:04 PM)  Does this person have difficulty dressing or bathing?: N (03/17/2024  1:04 PM)  *Shopping: Independent (03/17/2024  1:04 PM)  *House Keeping: Independent (03/17/2024  1:04 PM)  *Managing Own Medications: Independent (03/17/2024  1:04 PM)  *Handling Finances: Independent (03/17/2024  1:04 PM)  Difficulty doing errands due to a physicial, mental or emotional condition: No (03/17/2024  1:04 PM)  Difficulty remembering or making decisions due to a physicial, mental or emotional condition: No (03/17/2024  1:04 PM)      Fall Risk Screening Results:  Have you fallen in the last year?: No (03/17/2024  1:04 PM)  Do you feel you are at risk for falling?: No (03/17/2024  1:04 PM)      Assessment and Plan:     Cognitive Function:  Recall of recent and remote events appears:  Normal      Advanced Care Planning:  was discussed and the paperwork can be found in the scanned media section     The following health maintenance plan was reviewed with the patient:    Health Maintenance Topics with due status: Overdue       Topic Date Due    IMM-Zoster 11/08/2012    COVID-19 Vaccine 05/02/2023     Health Maintenance Topics with due status: Due Soon       Topic Date Due    Diabetic A1C Monitoring Other 04/01/2024     Health Maintenance Topics with due status: Postponed       Topic Postponed Until    HIV Scre[place] 01/02/2025 (Originally 08/16/1969)     Health Maintenance Topics with due status: Not Due       Topic Last Completion Date    IMM DTaP/Tdap/Td 09/26/2015    IMM-Influenza 07/17/2022    Colon Cancer Screening Other 06/03/2023    Diabetic Nephropathy Screening - Urine 07/05/2023    Diabetic Foot Exam ADA 07/22/2023    Breast Cancer Screening Other 12/02/2023    Diabetic Nephropathy Screening - Blood 12/31/2023    Diabetic Eye Exam ADA 01/19/2024    Depression Screen Yearly 03/17/2024    Fall Risk  Screening 03/17/2024     Health Maintenance Topics with due status: Completed       Topic Last Completion Date    Hepatitis C Screening [facility]ST[facility]/[place] 06/10/2016    IMM Pneumo: 50+ Years 03/17/2021    Osteoporosis Screening US[facility]STF 08/21/2022     Health Maintenance Topics with due status: Aged [employer] Date Due    IMM-Hepatitis B Vaccine Aged Out    IMM-HIB 0-5 Yrs or At-Risk Patients Aged Out    IMM-HPV 9-26 Yrs or Shared Decision (27-45 Yrs) Aged Out    IMM-MCV4 0-18 Yrs  or At-Risk Patients Aged Out    IMM-Rotavirus 0-8 Months Aged Out    IMM-MenB (2 Plans: Shared decision & Increased Risk Plans) Aged Out     This health maintenance schedule, identified risks, a list of orders placed today and patient goals have been provided to Robin Green in the after visit summary.     Plan for any concerns identified during screening or risk assessments:  Vaccination discussed

## 2024-03-17 NOTE — Progress Notes (Signed)
 [facility]  Progress Note    Reason For Visit   Adult Comprehensive Physical Exam.    HPI   Today has concerns of:   History of Present Illness  The patient presents for a Medicare wellness visit.and physical    She reports no concerns with memory and has designated her son as her healthcare proxy. She has not had a gynecological exam this year. She is currently seeking a new dentist due to insurance issues with her previous one. She does not report any fever, chills, rashes, sore throat, chest pain, palpitations, coughing, wheezing, shortness of breath, painful urination, neck or back pain, heartburn, nausea, or vomiting. She experiences dizziness and headaches when she skips meals. Her mood is generally good. She engages in physical activity, including walking, but not as regularly as she would like. She takes calcium  and vitamin D supplements.    She is on metformin , which she takes intermittently. She is on hydrochlorothiazide  and verapamil  for blood pressure management.    She has an upcoming appointment with an orthopedist on 03/30/2024 due to occasional knee and upper thigh pain. She uses amoxicillin  for dental procedures and has previously taken gabapentin  for knee and shoulder pain.    Social History:  Alcohol: She does not consume alcohol.  Tobacco: She does not smoke cigarettes.    FAMILY HISTORY  There are no updates to her family history.       The Problem List, MHx, SHx, FHx, Immunizations, Medications, and Allergies were reviewed with the patient today during the appointment and updates were made today.    Allergies[1]  Current Outpatient Medications   Medication Sig Dispense Refill    verapamil  (CALAN -SR) 240 mg CR tablet TAKE 1 TABLET (240 MG TOTAL) BY MOUTH 2 TIMES DAILY. SWALLOW WHOLE. DO NOT CRUSH, BREAK, OR CHEW. 180 tablet 1    hydroCHLOROthiazide  12.5 MG tablet TAKE 2 TABLETS BY MOUTH DAILY. 180 tablet 1    Bioflavonoid Products (ESTER C PO) Take by mouth.      metFORMIN  (GLUCOPHAGE )  500 mg tablet TAKE 1 TABLET BY MOUTH TWICE A DAY WITH FOOD 180 tablet 1    ONETOUCH VERIO test strip TEST USING 1 STRIP 3 TIMES DAILY 100 each 5    blood glucose monitor device By no specified route daily  Use to test blood sugar as directed 1 each 0    Cinnamon 500 MG capsule Take 500 mg by mouth daily      UNABLE TO FIND Med Name:Osteo Biflex      lancets Brand Verio Flex; Use three  times per day as directed for blood glucose testing. 100 each 5    Zinc Sulfate (ZINC-220 PO) Take by mouth      Potassium 99 MG TABS Take by mouth       TURMERIC PO Take 1 tablet by mouth daily      Non-System Medication BP cuff, large Dx: HTN      cholecalciferol (VITAMIN D) 1000 UNIT tablet Take 2 tablets (2,000 units total) by mouth daily.      blood pressure monitor By no specified route daily. Check Bps 2-3 times/week 1 each 0    ALPRAZolam  (XANAX ) 0.25 mg tablet TAKE 1 TABLET (0.25 MG TOTAL) BY MOUTH 2 TIMES DAILY AS NEEDED FOR ANXIETY. MAX DAILY DOSE: 0.5 MG 20 tablet 0    nystatin  (MYCOSTATIN ) ointment APPLY 1,000,000 G TOPICALLY 2 TIMES DAILY TO THE FOLLOWING AREAS: UNDER BREAST 15 g 3  No current facility-administered medications for this visit.     Patient Active Problem List    Diagnosis Date Noted    Incisional hernia 12/29/2022     supraumbilical      Right thigh pain 09/25/2022    History of total knee replacement, right 09/25/2022    Thickened endometrium 07/31/2020    Status post hysteroscopy, polypectomy, D&C 07/31/2020    Acute pain of left shoulder 02/20/2020    Lumbar spondylosis 09/29/2018    Chronic left-sided low back pain with left-sided sciatica 12/16/2017    Hyperlipidemia, mixed 03/17/2017    Sudden hearing loss 02/25/2017    Obesity (BMI 30-39.9) 02/25/2017    Type 2 diabetes mellitus 02/25/2017    Carpal tunnel syndrome 02/25/2017    HTN (hypertension) 02/25/2017    Osteoarthritis, knee 02/25/2017    Presence of right artificial knee joint 12/25/2015     Past Medical History[2]  Past Surgical  History[3]  Family History[4]  Social History     Socioeconomic History    Marital status: Widowed   Tobacco Use    Smoking status: Never    Smokeless tobacco: Never   Substance and Sexual Activity    Alcohol use: No    Drug use: No    Sexual activity: Not Currently   Social History Narrative    Lives at home with daughter, Vernell        ROS   Review of Systems   Constitutional:  Negative for chills and fever.   HENT:  Negative for sore throat.    Respiratory:  Negative for cough, shortness of breath and wheezing.    Cardiovascular:  Negative for chest pain and palpitations.   Gastrointestinal:  Negative for heartburn, nausea and vomiting.   Genitourinary:  Negative for dysuria.   Musculoskeletal:  Positive for joint pain and myalgias. Negative for back pain and neck pain.   Skin:  Negative for rash.   Neurological:  Negative for dizziness and headaches.   Psychiatric/Behavioral:  Negative for depression. The patient is not nervous/anxious.        Vitals  Blood pressure 124/64, pulse 76, resp. rate 16, height 1.626 m (5' 4), weight 84.4 kg (186 lb), SpO2 98%, not currently breastfeeding.    Physical Exam  Physical Exam  Vitals reviewed.   Constitutional:       General: She is not in acute distress.     Appearance: She is not diaphoretic.      Comments: Pleasant lady in no distress   HENT:      Head: Normocephalic and atraumatic.      Right Ear: Tympanic membrane, ear canal and external ear normal.      Left Ear: Tympanic membrane, ear canal and external ear normal.      Nose: Nose normal.      Mouth/Throat:      Mouth: Mucous membranes are moist.      Pharynx: Oropharynx is clear. No oropharyngeal exudate.   Eyes:      General: No scleral icterus.        Right eye: No discharge.         Left eye: No discharge.      Conjunctiva/sclera: Conjunctivae normal.      Pupils: Pupils are equal, round, and reactive to light.   Neck:      Thyroid : No thyromegaly.   Cardiovascular:      Rate and Rhythm: Normal rate and regular  rhythm.      Heart sounds:  Normal heart sounds. No murmur heard.     No gallop.   Pulmonary:      Effort: Pulmonary effort is normal. No respiratory distress.      Breath sounds: Normal breath sounds. No wheezing or rales.   Abdominal:      General: There is no distension.      Palpations: Abdomen is soft. There is no mass.      Tenderness: There is no abdominal tenderness. There is no guarding or rebound.   Musculoskeletal:         General: Normal range of motion.      Cervical back: Neck supple.   Lymphadenopathy:      Cervical: No cervical adenopathy.   Skin:     General: Skin is warm and dry.   Neurological:      Mental Status: She is alert and oriented to person, place, and time.      Cranial Nerves: No cranial nerve deficit.   Psychiatric:         Mood and Affect: Mood and affect normal.         Lab Results  Lab Results   Component Value Date    WBC 5.1 03/06/2022    HGB 13.1 03/06/2022    HCT 40 03/06/2022    MCV 90 03/06/2022    PLT 253 03/06/2022       Chemistry        Lab results: 12/31/23  1531   Sodium 139   Potassium 4.1   Chloride 101   CO2 27   UN 11   Creatinine 0.93        Lab results: 12/31/23  1531   Glucose 91   Calcium  9.6   Total Protein 7.0   Albumin 4.0   ALT 23   AST 22   Alk Phos 57   Bilirubin,Total 0.5          Lab Results   Component Value Date    CHOL 205 (!) 12/31/2023    HDL 85 (H) 12/31/2023    LDLC 111 12/31/2023    TRIG 47 12/31/2023    CHHDC 2.4 12/31/2023     Lab Results   Component Value Date    TSH 0.79 03/23/2019     Hemoglobin A1C   Date Value Ref Range Status   12/31/2023 6.6 (H) % Final     Comment:     Ref Range <=5.6  HbA1c values of 5.7-6.4% indicate an increased risk for developing  diabetes mellitus.  HbA1c values greater than or equal to 6.5% are diagnostic of  diabetes mellitus.  For diagnosis of diabetes in individuals without unequivocal  hyperglycemia, results should be confirmed by repeat testing.           Assessment   This is a 68 y.o.female who comes in today  for a physical exam    1. Preventative health care (Primary)  Reviewed healthy lifestyle measures. Reviewed routine preventive care and immunizations. Reviewed current guidelines re : nutrition ,sleep ,hydration and exercise.      2. Hypertension, unspecified type   Follow a low salt diet and continue to work on wt loss. Continue verapamil  and HCTZ as prescribed. Goal BP of 130/90 or less. Labs reviewed with patient  - blood pressure monitor; By no specified route daily. Check Bps 2-3 times/week  Dispense: 1 each; Refill: 0  - Comprehensive metabolic panel; Future  - Lipid Panel (Reflex to Direct  LDL if Triglycerides more than 400);  Future    3. Type 2 diabetes mellitus  Follow a low carb diet and continue to work on wt loss. Continue metformin  at current dose and continue verapamil  as prescribed. Labs reviewed with patient  - AMB REFERRAL [facility] [place] [facility]  - Hemoglobin A1c; Future  - Microalbumin, Urine, Random; Future       4. Hyperlipidemia, mixed  The 10-year ASCVD risk score (Arnett DK, et al., 2019) is: 22.7%    Values used to calculate the score:      Age: 79 years      Sex: Female      Is Non-Hispanic African American: Yes      Diabetic: Yes      Tobacco smoker: No      Systolic Blood Pressure: 124 mmHg      Is BP treated: Yes      HDL Cholesterol: 85 mg/dL      Total Cholesterol: 205 mg/dL     Aware of increased risk for heart attack and stroke. Risks and benefits of statins reviewed with patient. Declines statins    5. Obesity (BMI 30-39.9)  Continue to work on wt loss     6 Routine medical exam  Reviewed healthy lifestyle measures. Reviewed routine preventive care and immunizations. Reviewed current guidelines re : nutrition ,sleep ,hydration and exercise.          Follow up: Follow up in about 1 year (around 03/17/2025).    Author: Janiqua Friscia, MD  Note created: 03/17/2024  at: 1:21 PM           [1]   Allergies  Allergen Reactions    Nifedipine  Swelling    Seafood Itching    Glipizide   Other (See Comments)     Memory concerns and numbness    Lisinopril Other (See Comments)     Dizziness and cough    [2]   Past Medical History:  Diagnosis Date    Anxiety     Diabetes     Diabetes mellitus     Diabetes mellitus, type 2     High blood pressure     Hypertension     Rheumatoid arthritis    [3]   Past Surgical History:  Procedure Laterality Date    BACK SURGERY  09/13/18    BREAST BIOPSY  2008    CHOLECYSTECTOMY      CHOLECYSTECTOMY, LAPAROSCOPIC      JOINT REPLACEMENT      left TKR    KNEE REPLACEMENT  2008/2017    x2    KNEE SURGERY      LUMBAR FUSION      L4-5 decompression with instrumented fusion    PR HYSTEROSCOPY BX ENDOMETRIUM&/POLYPC W/WO D&C N/A 07/31/2020    Procedure: D & C HYSTEROSCOPY WITH MYOSURE, POLYPECTOMY;  Surgeon: Gunnar Raymona Louder, MD;  Location: [facility];  Service: OBGYN   [4]   Family History  Problem Relation Name Age of Onset    Breast cancer Mother      Prostate cancer Father Gale Essex     Dementia Father Gale [place]     Diabetes Sister Cy Daring     Hypertension Sister Cy Daring     Cataracts Maternal Aunt      Diabetes Maternal Uncle      Coronary artery disease Maternal Uncle      Glaucoma Maternal Uncle      Amblyopia ([place]) Neg Hx      Blindness Neg Hx  Heart Disease Neg Hx      Macular degeneration Neg Hx      Retinal detachment Neg Hx      Strabismus Neg Hx      Anesthesia problems Neg Hx      Clotting disorder Neg Hx

## 2024-03-17 NOTE — Patient Instructions (Signed)
 Thank you for completing your Annual Exam and Subsequent Annual Medicare Visit   with us  today.     The purpose of this visits was to:    Screen for disease  Assess risk of future medical problems  Help develop a healthy lifestyle  Update vaccines  Get to know your doctor in case of an illness    Patient Care Team:  Alto Flank, MD as PCP - General (Primary Care)  Ansel Maus, AUD as PCP - CCP-AUDIOLOGY  Kimberlee Bare, MD (Gastroenterology)  Theophilus Garland Notch, DPM (Podiatry)  Dasilva, Harlene Ruth, OD (Ophthalmology)     Medicare 5 Year Plan    The following items were identified as areas of concern during your screening today:  Diabetes - This is a risk factor for Heart Attack, Stroke, Kidney Problems, Eye Problems and a loss of feeling in your fingers and feet.   High Blood Pressure (Hypertension) - This is a risk factor for Heart Attack, Stroke, Kidney Problems and Eye Problems.   BMI greater than 25 - This is a risk for Heart Attack, Stroke, High Blood Pressure, Diabetes, High Cholesterol and other complications.     The Health Maintenance table below identifies screening tests and immunizations recommended by your health care team:  Health Maintenance: These screening recommendations are based on USPSTF, [employer], and [place] state guidelines   Topic Date Due   . Shingles Vaccine (2 of 3) 11/08/2012   . COVID-19 Vaccine (4 - 2024-25 season) 05/02/2023   . Diabetic A1C Monitoring  04/01/2024   . HIV Screening  01/02/2025 (Originally 08/16/1969)   . Flu Shot (1) 05/01/2024   . Diabetic Kidney Disease Urine  07/04/2024   . Diabetic Foot Exam  07/21/2024   . Diabetic Kidney Disease Blood  12/30/2024   . Depression - Yearly  03/17/2025   . Fall Risk Screening  03/17/2025   . DTaP/Tdap/Td Vaccines (2 - Td or Tdap) 09/25/2025   . Breast Cancer Screening  12/01/2025   . Diabetic Eye Exam  01/18/2026   . Colon Cancer Screening - Other  06/02/2030   . Hepatitis C Screening  Completed   .  Osteoporosis Screening  Completed   . Pneumococcal Vaccination  Completed   . Hepatitis B Vaccine  Aged Out   . HIB Vaccine  Aged Out   . HPV Vaccine  Aged Out   . Meningococcal Vaccine  Aged Out   . Rotavirus Vaccine  Aged Out   . Meningitis Vaccine  Aged Out     In addition, goals and orders placed to address these recommendations are listed in the Today's Visit section.    We wish you the best of health and look forward to seeing you again next year for your Annual Medicare Wellness Visit.     If you have any health care concerns before then, please do not hesitate to contact us .

## 2024-03-30 ENCOUNTER — Encounter: Payer: Self-pay | Admitting: Gastroenterology

## 2024-03-30 ENCOUNTER — Other Ambulatory Visit: Payer: Self-pay | Admitting: Orthopedic Surgery

## 2024-05-05 ENCOUNTER — Other Ambulatory Visit: Payer: Self-pay | Admitting: Primary Care

## 2024-05-05 NOTE — Telephone Encounter (Signed)
 1 in mm Last Ov 03/17/24 Next appt 07/21/24 Please erx Found this in history medication

## 2024-05-05 NOTE — Telephone Encounter (Signed)
 Refill requestMedication: Amoxicillin  Confirm that the patient wants the medication sent to: [id number] [place], [place] - [address] [place] [address]: [phone] Fax: [phone]  Additional message: For dental procedure.Relevant labs:Last Office Visit   Date Provider Department Visit Type Primary Dx  03/17/2024 Alto Flank, MD [facility] Office Visit Preventative health care    Last Telemedicine Visit   Date Provider Department Visit Type Primary Dx  12/29/2018 Alto Flank, MD [facility] Telemedicine Visit Preventative health care  Last PA Office Visit  None  Next visit: Future Encounters    07/21/2024  1:20 PM Sch  FOLLOW UP VISIT  Authorization not required  PRA  Alto Flank, MD       Additional information to be completed by clinical staff:If the request is for a controlled substance please paste the iStop results below:**If the refill protocol indicates they are due for labs please order them and tell the patient to have them drawn**

## 2024-05-06 ENCOUNTER — Other Ambulatory Visit: Payer: Self-pay | Admitting: Primary Care

## 2024-05-08 ENCOUNTER — Other Ambulatory Visit: Payer: Self-pay | Admitting: Primary Care

## 2024-05-08 DIAGNOSIS — I10 Essential (primary) hypertension: Secondary | ICD-10-CM

## 2024-05-08 NOTE — Telephone Encounter (Signed)
 Spoke with patient and she was informed due to the change in guidleline for prophalxis antibiotic pre dental work is not they are not needed

## 2024-05-08 NOTE — Telephone Encounter (Signed)
 2 in [place] ov 07/18/25Next ov 11/21/25Please erxA Robin Green 05/20/1956 Female [address] [place] [address]  Search:PDI My Rx Current Rx Drug Type Rx Written Rx Dispensed Drug Quantity Days Supply Prescriber Name Prescriber [facility] # Payment Method Dispenser A Y N B 01/10/2024 01/14/2024 alprazolam  0.25 mg tablet 20 10 Alto Flank MD [id number] Medicare [facility] [phone] A N N B 09/14/2023 09/15/2023 alprazolam  0.25 mg tablet 20 10 Ginnie Saver [id number] Medicare [facility] [phone]

## 2024-05-08 NOTE — Telephone Encounter (Signed)
 1 in mmLast ov 07/18/25Next ov 11/21/25Please erx

## 2024-05-08 NOTE — Telephone Encounter (Signed)
 Patient called and states needs amoxicillin  refill. Not seeing that med in medication list. Patient says has to take it an hour before dentist appt. On Wednesday. Unsure what patient means by this even after clarification- please advise.

## 2024-05-19 ENCOUNTER — Other Ambulatory Visit: Payer: Self-pay | Admitting: Primary Care

## 2024-05-19 DIAGNOSIS — E119 Type 2 diabetes mellitus without complications: Secondary | ICD-10-CM

## 2024-05-22 NOTE — Telephone Encounter (Signed)
 Refill requestMedication: Confirm that the patient wants the medication sent to: [id number] [place], [place] - [address] [place] [place] [address]: [phone] Fax: [phone]  Additional message: Relevant labs:Diabetes medication (HgBA1c, BMP, last urine microalbumin)  Lab results: 05/02/251531 Sodium 139 Potassium 4.1 Chloride 101 CO2 27 UN 11 Creatinine 0.93 Glucose 91 Calcium 9.6  Microalbumin,UR (mg/dL) Date Value 88/95/7975 <2  Hemoglobin A1C (%) Date Value 12/31/2023 6.6 (H) Last Office Visit   Date Provider Department Visit Type Primary Dx  03/17/2024 Alto Flank, MD [facility] Office Visit Preventative health care    Last Telemedicine Visit   Date Provider Department Visit Type Primary Dx  12/29/2018 Alto Flank, MD [facility] Telemedicine Visit Preventative health care  Last PA Office Visit  None  Next visit: Future Encounters    07/21/2024  1:20 PM Sch  FOLLOW UP VISIT  Authorization not required  PRA  Alto Flank, MD       Additional information to be completed by clinical staff:If the request is for a controlled substance please paste the iStop results below:**If the refill protocol indicates they are due for labs please order them and tell the patient to have them drawn**

## 2024-06-06 ENCOUNTER — Other Ambulatory Visit: Payer: Self-pay

## 2024-06-07 ENCOUNTER — Ambulatory Visit: Payer: Medicare (Managed Care) | Attending: Primary Care | Admitting: Audiology

## 2024-06-07 ENCOUNTER — Other Ambulatory Visit: Payer: Self-pay

## 2024-06-07 ENCOUNTER — Encounter: Payer: Self-pay | Admitting: Gastroenterology

## 2024-06-07 DIAGNOSIS — H9042 Sensorineural hearing loss, unilateral, left ear, with unrestricted hearing on the contralateral side: Secondary | ICD-10-CM | POA: Insufficient documentation

## 2024-06-07 NOTE — Progress Notes (Signed)
 AUDIOLOGIC EVALUATION [facility] [address] [place], [address],  [place]  [address]: [phone], Fax: [phone] Outpatient VisitPatient: Robin Green MR Number: [id number] Date of Birth: 04-18-1956 Date of Visit: 06/07/2024  PURE-TONE TEST RESULTSType of Testing: conventional    Test Reliability: goodTransducer: insertBooth: 1ANSI S3.21.2004 (R2009) Air Conduction Testing (dB HL and kHz) LEFT EAR RIGHT EAR 0.125 0.25 0.50  0.75 1.0 1.5 2.0 3.0 4.0 6.0 8.0  0.125 0.25 0.50 0.75 1.0 1.5 2.0 3.0 4.0 6.0 8.0   25 45 65 80 105 110 110 NR NR NR      15 10 20 15 15 20 20 25 25 15                                                                   Effective Masking Level       40 55 65 65 65 65 65 65                        Bone Conduction Testing (dB HL and kHz)Bone conduction was masked when appropriate  0.25 0.50  0.75 1.0 1.5 2.0 3.0 4.0     0.25 0.50 0.75 1.0 1.5 2.0 3.0 4.0                                                                                                           Effective Masking Level                                         SPEECH AUDIOMETRY SAT SRT Score dB HL/MCL EML  Test  SAT SRT Score dB HL/MCL EML  Test     20 % 95 65 W-22 CD      100 % 55   W-22 CD EML   EML            EML   EML           Please refer to scanned [facility] [id number] MR for additional information NotesThreshold in dB HLFrequency in kiloHertz (kHz) Legend dB=decibelsHL=Hearing LevelNR=No ResponseVT=Vibro-Tactile EML=Effective Masking LevelSAT=Speech Awareness ThresholdSRT=Speech Reception  ThresholdMCL=Most Comfortable Loudness LevelMLV=Monitored Live VoiceCD=Compact Disk HISTORY: Carloyn Lahue was referred by Dr. Gailen Lever for an audiological evaluation to determine whether patient is a candidate for medical, surgical or rehabilitative services.  Recall,  patient is known to this clinic with a history of sudden sensorineural hearing loss in the left ear on 07/13/2015, which was managed by [facility] at that time.  Most recent evaluation from 12/22/2022 revealed mild sloping to profound sensorineural hearing loss, left ear, and normal hearing sensitivity, right ear.  Today patient reports concerns for a the further gradual  decline of her left ear hearing since the last evaluation, and she'd like to check to see if that is what has occurred.  She reports no concerns for right ear changes or hearing difficulties today.  Tinnitus, ear pain, and dizziness also denied today.FINDINGS: Pure-tone test results indicate a moderate sloping to profound known to be sensorineural hearing loss, left ear, and normal hearing sensitivity, right ear.  Asymmetry remains unchanged, left ear worse than right.  Speech recognition ability in quiet was very poor when presented at very loud levels, left ear, and excellent when presented at average conversational levels, right ear.Thresholds have remained essentially unchanged since last evaluation on 12/22/2022.Today's results were discussed with the patient.  Return in one year to monitor, sooner with concerns.RECOMMENDATIONS: Audiological re-evaluation as per Dr. Alto.Rivaldo Hineman, Au.D., CCC-ASenior AudiologistUR Medicine Audiology

## 2024-06-12 ENCOUNTER — Other Ambulatory Visit: Payer: Self-pay

## 2024-06-13 ENCOUNTER — Ambulatory Visit: Payer: Medicare (Managed Care) | Admitting: Foot & Ankle Surgery

## 2024-06-13 ENCOUNTER — Other Ambulatory Visit: Payer: Self-pay

## 2024-06-13 VITALS — Ht 64.0 in | Wt 186.0 lb

## 2024-06-13 DIAGNOSIS — E119 Type 2 diabetes mellitus without complications: Secondary | ICD-10-CM

## 2024-06-13 NOTE — Progress Notes (Signed)
 SUBJECTIVE:  Chief Complaint Patient presents with  Left Foot - Follow-up, Diabetes  Right Foot - Follow-up, Diabetes   DFCDM X 7-8 years, BG today AM= did not takeWearing slip onsA1C: 6.6 on 12/31/23 68 year old patient presents to clinic for diabetic foot check.  Last A1c was 6.6.  Denies any symptoms to her feet today.  Overall she is feeling great. Pt denies any F/C/N/V/SOB/CP. Pt denies any redness, drainage, pus, streaking, or other SOI.  No new complaints. PMH, PSH, Family, Social, Meds, see aboveAllergy: Allergies Allergen Reactions  Nifedipine  Swelling  Seafood Itching  Glipizide  Other (See Comments)   Memory concerns and numbness  Lisinopril Other (See Comments)   Dizziness and cough   OBJECTIVE FINDINGS:   General: AOx3, NAD.  Gait is normalVascular: DP/PT 2/4 b/l, CFT <3 sec b/lNeuro: Sensation intact with SW 5.07g monofilament b/lIntegument: Skin is warm, dry, supple.  Hair present to the level of the digits.  No discoloration of skin appreciated.  No preulcerative lesions appreciated.  No erythema, edema or signs of infection appreciated.MSK: Motor fxn intact b/l. Muscle strength 5/5 b/l. Arch height is decreased.  HbA1cHemoglobin A1C Date Value Ref Range Status 12/31/2023 6.6 (H) % Final   Comment:   Ref Range <=5.6HbA1c values of 5.7-6.4% indicate an increased risk for developingdiabetes mellitus.HbA1c values greater than or equal to 6.5% are diagnostic ofdiabetes mellitus.For diagnosis of diabetes in individuals without unequivocalhyperglycemia, results should be confirmed by repeat testing. 07/19/2023 6.6 (H) % Final   Comment:   Ref Range <=5.6HbA1c values of 5.7-6.4% indicate an increased risk for developingdiabetes mellitus.HbA1c values greater than or equal to 6.5% are diagnostic ofdiabetes mellitus.For diagnosis of diabetes in individuals without unequivocalhyperglycemia, results should be  confirmed by repeat testing. 03/16/2023 6.5 (H) % Final   Comment:   Ref Range <=5.6HbA1c values of 5.7-6.4% indicate an increased risk for developingdiabetes mellitus.HbA1c values greater than or equal to 6.5% are diagnostic ofdiabetes mellitus.For diagnosis of diabetes in individuals without unequivocalhyperglycemia, results should be confirmed by repeat testing. 11/27/2022 6.6 (H) % Final   Comment:   Ref Range <=5.6HbA1c values of 5.7-6.4% indicate an increased risk for developingdiabetes mellitus.HbA1c values greater than or equal to 6.5% are diagnostic ofdiabetes mellitus.For diagnosis of diabetes in individuals without unequivocalhyperglycemia, results should be confirmed by repeat testing. 07/14/2022 6.6 (H) % Final   Comment:   Ref Range <=5.6HbA1c values of 5.7-6.4% indicate an increased risk for developingdiabetes mellitus.HbA1c values greater than or equal to 6.5% are diagnostic ofdiabetes mellitus.For diagnosis of diabetes in individuals without unequivocalhyperglycemia, results should be confirmed by repeat testing.  DIAGNOSIS:  Encounter Diagnoses Name Primary?  Type 2 diabetes mellitus without complication, without long-term current use of insulin Yes  Encounter for diabetic foot exam                       PLAN:   Pt seen and examined Debrided all offending nails without incident. Educated patient on daily foot checks and good foot hygiene.Educated patient on neuropathic changes and pt to follow up sooner should  notice any of these problems.Discussed need to keep blood sugars within normal limits I personally reviewed HbA1c with the patient in great detail with most recent H A1c of 6.6Detailed explanation of potential foot and ankle problems associated with diabetesPatient has been afforded the opportunity to ask questions. All questions answered. Patient verbalized understanding of all issues and  instructionsF/U yearly for DM check

## 2024-07-06 ENCOUNTER — Other Ambulatory Visit: Payer: Self-pay | Admitting: Primary Care

## 2024-07-06 NOTE — Telephone Encounter (Signed)
 1 in mm last Ov 03/17/24 Next appt 07/21/24 Please erx

## 2024-07-19 ENCOUNTER — Other Ambulatory Visit
Admission: RE | Admit: 2024-07-19 | Discharge: 2024-07-19 | Disposition: A | Discharge status: Home / Self Care | Payer: Medicare (Managed Care) | Source: Ambulatory Visit | Attending: Primary Care | Admitting: Primary Care

## 2024-07-19 DIAGNOSIS — E119 Type 2 diabetes mellitus without complications: Secondary | ICD-10-CM | POA: Insufficient documentation

## 2024-07-19 DIAGNOSIS — I10 Essential (primary) hypertension: Secondary | ICD-10-CM | POA: Insufficient documentation

## 2024-07-19 LAB — COMPREHENSIVE METABOLIC PANEL
ALT: 23 U/L (ref 0–35)
AST: 25 U/L (ref 0–35)
Albumin: 4.2 g/dL (ref 3.5–5.2)
Alk Phos: 51 U/L (ref 35–105)
Anion Gap: 12 (ref 7–16)
Bilirubin,Total: 0.6 mg/dL (ref 0.0–1.2)
CO2: 28 mmol/L (ref 20–28)
Calcium: 9.6 mg/dL (ref 8.6–10.2)
Chloride: 99 mmol/L (ref 96–108)
Creatinine: 0.98 mg/dL — ABNORMAL HIGH (ref 0.51–0.95)
Glucose: 114 mg/dL — ABNORMAL HIGH (ref 60–99)
Lab: 14 mg/dL (ref 6–20)
Potassium: 3.9 mmol/L (ref 3.3–5.1)
Sodium: 139 mmol/L (ref 133–145)
Total Protein: 6.9 g/dL (ref 6.3–7.7)
eGFR BY CREAT: 62

## 2024-07-19 LAB — MICROALBUMIN, URINE, RANDOM
Creatinine,UR: 260 mg/dL (ref 20–300)
Microalb/Creat Ratio: 5.3 mg MA/g CR (ref 0.0–29.9)
Microalbumin,UR: 1.37 mg/dL

## 2024-07-19 LAB — LIPID PANEL
Chol/HDL Ratio: 2.3
Cholesterol: 212 mg/dL — AB
HDL: 93 mg/dL — ABNORMAL HIGH (ref 40–60)
LDL Calculated: 110 mg/dL
Non HDL Cholesterol: 119 mg/dL
Triglycerides: 50 mg/dL

## 2024-07-20 ENCOUNTER — Other Ambulatory Visit: Payer: Self-pay

## 2024-07-20 LAB — HEMOGLOBIN A1C: Hemoglobin A1C: 6.6 % — ABNORMAL HIGH (ref <=5.6)

## 2024-07-21 ENCOUNTER — Ambulatory Visit: Payer: Medicare (Managed Care) | Admitting: Primary Care

## 2024-07-21 ENCOUNTER — Encounter: Payer: Self-pay | Admitting: Primary Care

## 2024-07-21 VITALS — BP 140/78 | HR 72 | Resp 16

## 2024-07-21 DIAGNOSIS — E782 Mixed hyperlipidemia: Secondary | ICD-10-CM

## 2024-07-21 DIAGNOSIS — E669 Obesity, unspecified: Secondary | ICD-10-CM

## 2024-07-21 DIAGNOSIS — I10 Essential (primary) hypertension: Secondary | ICD-10-CM

## 2024-07-21 DIAGNOSIS — E119 Type 2 diabetes mellitus without complications: Secondary | ICD-10-CM

## 2024-07-21 DIAGNOSIS — Z23 Encounter for immunization: Secondary | ICD-10-CM

## 2024-07-21 MED ORDER — ROSUVASTATIN CALCIUM 10 MG PO TABS *I*
10.0000 mg | ORAL_TABLET | Freq: Every day | ORAL | 3 refills | Status: AC
Start: 2024-07-21 — End: 2025-07-21

## 2024-07-21 NOTE — Progress Notes (Unsigned)
 [facility] HISTORY OF PRESENT ILLNESS Robin Green is a 68 y.o. female who presents for Diabetes and Lab ResultsROSMEDICATIONS Current Outpatient Medications Medication Sig  verapamil  (CALAN -SR) 240 mg CR tablet TAKE 1 TABLET (240 MG TOTAL) BY MOUTH 2 TIMES DAILY. SWALLOW WHOLE. DO NOT CRUSH, BREAK, OR CHEW.  ONETOUCH VERIO test strip TEST USING 1 STRIP 3 TIMES DAILY  ALPRAZolam  (XANAX ) 0.25 mg tablet TAKE 1 TABLET (0.25 MG TOTAL) BY MOUTH 2 TIMES DAILY AS NEEDED FOR ANXIETY. MAX DAILY DOSE: 0.5 MG  hydroCHLOROthiazide  12.5 MG tablet TAKE 2 TABLETS BY MOUTH DAILY.  blood pressure monitor By no specified route daily. Check Bps 2-3 times/week  Bioflavonoid Products (ESTER C PO) Take by mouth.  metFORMIN  (GLUCOPHAGE ) 500 mg tablet TAKE 1 TABLET BY MOUTH TWICE A DAY WITH FOOD  nystatin  (MYCOSTATIN ) ointment APPLY 1,000,000 G TOPICALLY 2 TIMES DAILY TO THE FOLLOWING AREAS: UNDER BREAST  blood glucose monitor device By no specified route daily  Use to test blood sugar as directed  Cinnamon 500 MG capsule Take 500 mg by mouth daily  UNABLE TO FIND Med Name:Osteo Biflex  lancets Brand Verio Flex; Use three  times per day as directed for blood glucose testing.  Zinc Sulfate (ZINC-220 PO) Take by mouth  Potassium 99 MG TABS Take by mouth   TURMERIC PO Take 1 tablet by mouth daily  Non-System Medication BP cuff, large Dx: HTN  cholecalciferol (VITAMIN D) 1000 UNIT tablet Take 2 tablets (2,000 units total) by mouth daily. 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, G89.29  Lumbar spondylosis M47.816  Acute pain of left shoulder M25.512  Thickened endometrium R93.89  Status post hysteroscopy, polypectomy, D&C Z98.890  Right thigh pain M79.651  History of total knee replacement, right Z96.651  Incisional hernia K43.2 TOBACCO USE HISTORY Tobacco Use History[1]PHYSICAL EXAM BP 140/78   Pulse 72   Resp 16   SpO2 100%   Breastfeeding No General Appearance: No distressPhysical ExamRECENT LABS Recent Results (from the past 2 weeks) Microalbumin, Urine, Random  Collection Time: 07/19/24 12:01 PM Result Value Ref Range  Creatinine,UR 260 20 - 300 mg/dL  Microalbumin,UR 8.62 SEE MA/CR RATIO mg/dL  Microalb/Creat Ratio 5.3 0.0 - 29.9 mg MA/g CR Lipid Panel (Reflex to Direct  LDL if Triglycerides more than 400)  Collection Time: 07/19/24 12:01 PM Result Value Ref Range  Cholesterol 212 (!) mg/dL  Triglycerides 50 mg/dL  HDL 93 (H) 40 - 60 mg/dL  LDL Calculated 889 mg/dL  Non HDL Cholesterol 880 mg/dL  Chol/HDL Ratio 2.3  Comprehensive metabolic panel  Collection Time: 07/19/24 12:01 PM Result Value Ref Range  Sodium 139 133 - 145 mmol/L  Potassium 3.9 3.3 - 5.1 mmol/L  Chloride 99 96 - 108 mmol/L  CO2 28 20 - 28 mmol/L  Anion Gap 12 7 - 16  UN 14 6 - 20 mg/dL  Creatinine 9.01 (H) 9.48 - 0.95 mg/dL  eGFR BY CREAT 62   Glucose 114 (H) 60 - 99 mg/dL  Calcium  9.6  8.6 - 10.2 mg/dL  Total Protein 6.9 6.3 - 7.7 g/dL  Albumin 4.2 3.5 - 5.2 g/dL  Bilirubin,Total 0.6 0.0 - 1.2 mg/dL  AST 25 0 - 35 U/L  ALT 23 0 - 35 U/L  Alk Phos 51 35 - 105 U/L Hemoglobin A1c  Collection Time: 07/19/24 12:01 PM Result Value Ref Range  Hemoglobin A1C 6.6 (H) <=5.6 %   ASSESSMENT/PLAN The 10-year ASCVD risk score (Arnett DK, et al., 2019) is: 29.1%  Values used to calculate the score:    Age: 23 years    Clinically relevant sex: Female    Is Non-Hispanic African American: Yes    Diabetic: Yes    Tobacco smoker: No    Systolic Blood Pressure: 140 mmHg    Is BP treated: Yes    HDL Cholesterol: 93 mg/dL    Total Cholesterol: 787 mg/dL No follow-ups on file.--Patient instructed to call if symptoms are not improving or worsening--Follow-up arrangedSigned: Lui Bellis, MD on 07/21/2024 at 1:30 PM [1] Social HistoryTobacco Use Smoking Status Never Smokeless Tobacco Never

## 2024-08-25 ENCOUNTER — Other Ambulatory Visit: Payer: Self-pay | Admitting: Primary Care

## 2024-08-25 DIAGNOSIS — I10 Essential (primary) hypertension: Secondary | ICD-10-CM

## 2024-08-25 MED ORDER — BLOOD PRESSURE MONITOR DEVICE *A*: Freq: Every day | 0 refills | Status: AC

## 2024-08-25 NOTE — Telephone Encounter (Signed)
 1 in mm Last Ov 07/21/24 Next appt 11/27/24 Please erx

## 2024-08-25 NOTE — Telephone Encounter (Signed)
 Patient called in to request the monitor for blood pressure.Please advise.

## 2024-08-29 ENCOUNTER — Other Ambulatory Visit: Payer: Self-pay | Admitting: Primary Care

## 2024-08-29 NOTE — Telephone Encounter (Signed)
 1 in [place] ov 11/21/25Next ov 03/30/26Please erxPatient_3 Robin Green 15-Feb-1956 F [address] [place], [place] [address] [place] [address]Prescription Information: Search09/03/2024 05/11/2024 alprazolam  0.25 mg tablet 20 10 Alto Flank MD Medicare [facility] [phone] Patient_105/07/2024 01/14/2024 alprazolam  0.25 mg tablet 20 10 Alto Flank MD [facility] [phone] Patient_201/14/2025 09/15/2023 alprazolam  0.25 mg tablet 20 10 Ginnie Saver [facility] [phone] Robin Green

## 2024-09-04 ENCOUNTER — Other Ambulatory Visit: Payer: Self-pay | Admitting: Primary Care

## 2024-09-04 DIAGNOSIS — E119 Type 2 diabetes mellitus without complications: Secondary | ICD-10-CM

## 2024-09-04 NOTE — Telephone Encounter (Signed)
 Last office visit: 11/21/2025Last telehome visit: Visit date not foundPatients upcoming appointments:Future Appointments Date Time Provider Department Center 11/27/2024  2:00 PM Alto Flank, MD [facility] None 01/26/2025  9:00 AM Dasilva, Harlene Ruth, OD [facility] None BP Readings from Last 3 Encounters: 07/21/24 140/78 03/17/24 124/64 02/18/24 140/76

## 2024-09-05 NOTE — Telephone Encounter (Signed)
 Last office visit: 11/21/2025Last telehome visit: Visit date not foundPatients upcoming appointments:Future Appointments Date Time Provider Department Center 11/27/2024  2:00 PM Alto Flank, MD [facility] None 01/26/2025  9:00 AM Dasilva, Harlene Ruth, OD [facility] None BP Readings from Last 3 Encounters: 07/21/24 140/78 03/17/24 124/64 02/18/24 140/76

## 2024-09-06 ENCOUNTER — Other Ambulatory Visit: Payer: Self-pay | Admitting: Primary Care

## 2024-09-06 DIAGNOSIS — E119 Type 2 diabetes mellitus without complications: Secondary | ICD-10-CM

## 2024-09-06 NOTE — Telephone Encounter (Signed)
 Last office visit: 11/21/2025Last telehome visit: Visit date not foundPatients upcoming appointments:Future Appointments Date Time Provider Department Center 11/27/2024  2:00 PM Alto Flank, MD [facility] None 01/26/2025  9:00 AM Dasilva, Harlene Ruth, OD [facility] None BP Readings from Last 3 Encounters: 07/21/24 140/78 03/17/24 124/64 02/18/24 140/76

## 2024-09-07 ENCOUNTER — Other Ambulatory Visit: Payer: Self-pay | Admitting: Primary Care

## 2024-09-07 DIAGNOSIS — E119 Type 2 diabetes mellitus without complications: Secondary | ICD-10-CM

## 2024-09-08 MED ORDER — FREESTYLE FREEDOM LITE W/DEVICE KIT *A*: PACK | 0 refills | Status: AC

## 2024-09-08 MED ORDER — FREESTYLE LANCETS MISC *A*: 5 refills | Status: AC

## 2024-09-08 MED ORDER — FREESTYLE LITE TEST VI STRP *A*: 1.0000 | ORAL_STRIP | Freq: Three times a day (TID) | 5 refills | Status: AC

## 2024-09-08 NOTE — Telephone Encounter (Signed)
 Spoke to pharmacy and the product that her insurance will cover in [place] She will need everything sent over Last Ov 07/21/24 Next appt 11/27/24  Please erx

## 2024-09-09 ENCOUNTER — Other Ambulatory Visit: Payer: Self-pay | Admitting: Primary Care

## 2024-09-09 DIAGNOSIS — F419 Anxiety disorder, unspecified: Secondary | ICD-10-CM

## 2024-09-09 DIAGNOSIS — E119 Type 2 diabetes mellitus without complications: Secondary | ICD-10-CM

## 2024-09-11 NOTE — Telephone Encounter (Signed)
 Refill requestMedication: Confirm that the patient wants the medication sent to: [id number] [place], [place] - [address] [place] [place] [address]Eynwz: [phone] Fax: [phone]  Additional message: Relevant labs:Diabetes medication (HgBA1c, BMP, last urine microalbumin)  Lab results: 11/19/251201 Sodium 139 Potassium 3.9 Chloride 99 CO2 28 UN 14 Creatinine 0.98* Glucose 114* Calcium  9.6  Microalbumin,UR (mg/dL) Date Value 88/80/7974 1.37  Hemoglobin A1C (%) Date Value 07/19/2024 6.6 (H) Last Office Visit   Date Provider Department Visit Type Primary Dx  07/21/2024 Alto Flank, MD [facility] Office Visit Hypertension, unspecified type    Last Telemedicine Visit   Date Provider Department Visit Type Primary Dx  12/29/2018 Alto Flank, MD [facility] Telemedicine Visit Preventative health care  Last PA Office Visit  None  Next visit: Future Encounters    11/27/2024  2:00 PM Sch  FOLLOW UP VISIT  Authorization not required  PR[facility]  Alto Flank, MD       Additional information to be completed by clinical staff:If the request is for a controlled substance please paste the iStop results below:**If the refill protocol indicates they are due for labs please order them and tell the patient to have them drawn**

## 2024-09-11 NOTE — Telephone Encounter (Signed)
 Refill requestMedication: Confirm that the patient wants the medication sent to: [id number] [place], [place] - [address] [place] [place] [address]Eynwz: [phone] Fax: [phone]  Additional message: Relevant labs:Opiate medication No results for input(s): AMPU, BEU, OPSU, OXYU, THCU, BZDU, COPS, CTHC, UCRNC in the last 8760 hours.Last Office Visit   Date Provider Department Visit Type Primary Dx  07/21/2024 Alto Flank, MD [facility] Office Visit Hypertension, unspecified type    Last Telemedicine Visit   Date Provider Department Visit Type Primary Dx  12/29/2018 Alto Flank, MD [facility] Telemedicine Visit Preventative health care  Last PA Office Visit  None  Next visit: Future Encounters    11/27/2024  2:00 PM Sch  FOLLOW UP VISIT  Authorization not required  PRA  Alto Flank, MD       Additional information to be completed by clinical staff:If the request is for a controlled substance please paste the iStop results below:RX Written RX Dispensed Drug Quantity Days Supply Prescriber Name Payment Method Dispenser PDI12/30/2025 08/30/2024 alprazolam  0.25 mg tablet 20 10 Ethyl Berwyn PARAS MD Medicare [facility] [phone] Patient_109/03/2024 05/11/2024 alprazolam  0.25 mg tablet 20 10 Alto Flank MD Medicare [facility] [phone] Patient_205/07/2024 01/14/2024 alprazolam  0.25 mg tablet 20 10 Alto Flank MD Medicare [facility] [phone] Patient_301/14/2025 09/15/2023 alprazolam  0.25 mg tablet 20 10 Ginnie Saver [facility] [phone] Patient_4**If the refill protocol indicates they are due for labs please order them and tell the patient to have them drawn**

## 2024-11-27 ENCOUNTER — Ambulatory Visit: Payer: Medicare (Managed Care) | Admitting: Primary Care

## 2025-01-26 ENCOUNTER — Ambulatory Visit: Payer: Medicare (Managed Care) | Admitting: Optometry

## 2025-01-31 ENCOUNTER — Ambulatory Visit: Payer: Medicare (Managed Care) | Admitting: Optometry

## 2025-02-02 ENCOUNTER — Ambulatory Visit: Payer: Medicare (Managed Care) | Admitting: Optometry
